# Patient Record
Sex: Female | Born: 1955 | Race: White | Hispanic: No | Smoking: Never smoker
Health system: Southern US, Community
[De-identification: ages and names within clinical notes are randomized; demographics above are authoritative.]

## PROBLEM LIST (undated history)

## (undated) DIAGNOSIS — I10 Essential (primary) hypertension: Secondary | ICD-10-CM

## (undated) DIAGNOSIS — F329 Major depressive disorder, single episode, unspecified: Secondary | ICD-10-CM

## (undated) DIAGNOSIS — I2510 Atherosclerotic heart disease of native coronary artery without angina pectoris: Secondary | ICD-10-CM

## (undated) DIAGNOSIS — E1100 Type 2 diabetes mellitus with hyperosmolarity without nonketotic hyperglycemic-hyperosmolar coma (NKHHC): Secondary | ICD-10-CM

## (undated) DIAGNOSIS — I214 Non-ST elevation (NSTEMI) myocardial infarction: Secondary | ICD-10-CM

## (undated) DIAGNOSIS — F32A Depression, unspecified: Secondary | ICD-10-CM

## (undated) DIAGNOSIS — E785 Hyperlipidemia, unspecified: Secondary | ICD-10-CM

## (undated) DIAGNOSIS — F419 Anxiety disorder, unspecified: Secondary | ICD-10-CM

## (undated) HISTORY — PX: BREAST SURGERY: SHX581

## (undated) HISTORY — PX: OTHER SURGICAL HISTORY: SHX169

## (undated) HISTORY — DX: Major depressive disorder, single episode, unspecified: F32.9

## (undated) HISTORY — DX: Anxiety disorder, unspecified: F41.9

## (undated) HISTORY — DX: Depression, unspecified: F32.A

## (undated) HISTORY — DX: Type 2 diabetes mellitus with hyperosmolarity without nonketotic hyperglycemic-hyperosmolar coma (NKHHC): E11.00

## (undated) HISTORY — DX: Essential (primary) hypertension: I10

---

## 2001-03-29 ENCOUNTER — Other Ambulatory Visit: Admission: RE | Admit: 2001-03-29 | Discharge: 2001-03-29 | Payer: Self-pay | Admitting: *Deleted

## 2003-04-19 ENCOUNTER — Ambulatory Visit (HOSPITAL_COMMUNITY): Admission: RE | Admit: 2003-04-19 | Discharge: 2003-04-19 | Payer: Self-pay | Admitting: Family Medicine

## 2011-06-21 ENCOUNTER — Ambulatory Visit (INDEPENDENT_AMBULATORY_CARE_PROVIDER_SITE_OTHER): Payer: Commercial Managed Care - PPO

## 2011-06-21 DIAGNOSIS — M545 Low back pain, unspecified: Secondary | ICD-10-CM

## 2011-06-21 DIAGNOSIS — M5430 Sciatica, unspecified side: Secondary | ICD-10-CM

## 2011-06-28 ENCOUNTER — Ambulatory Visit (INDEPENDENT_AMBULATORY_CARE_PROVIDER_SITE_OTHER): Payer: Commercial Managed Care - PPO

## 2011-06-28 DIAGNOSIS — M545 Low back pain, unspecified: Secondary | ICD-10-CM

## 2012-01-29 ENCOUNTER — Ambulatory Visit (INDEPENDENT_AMBULATORY_CARE_PROVIDER_SITE_OTHER): Payer: Commercial Managed Care - PPO | Admitting: Emergency Medicine

## 2012-01-29 VITALS — BP 170/83 | HR 70 | Temp 98.4°F | Resp 16 | Ht <= 58 in | Wt 198.0 lb

## 2012-01-29 DIAGNOSIS — M23302 Other meniscus derangements, unspecified lateral meniscus, unspecified knee: Secondary | ICD-10-CM

## 2012-01-29 MED ORDER — HYDROCODONE-ACETAMINOPHEN 5-325 MG PO TABS: 1.0000 | ORAL_TABLET | ORAL | Status: AC | PRN

## 2012-01-29 MED ORDER — NAPROXEN SODIUM 550 MG PO TABS: 550.0000 mg | ORAL_TABLET | Freq: Two times a day (BID) | ORAL | Status: AC

## 2012-01-29 NOTE — Patient Instructions (Addendum)
Knee Sprain  You have a knee sprain. Sprains are painful injuries to the joints. A sprain is a partial or complete tearing of ligaments. Ligaments are tough, fibrous tissues that hold bones together at the joints. A strain (sprain) has occurred when a ligament is stretched or damaged. This injury may take several weeks to heal. This is often the same length of time as a bone fracture (break in bone) takes to heal. Even though a fracture (bone break) may not have occurred, the recovery times may be similar.  HOME CARE INSTRUCTIONS   · Rest the injured area for as long as directed by your caregiver. Then slowly start using the joint as directed by your caregiver and as the pain allows. Use crutches as directed. If the knee was splinted or casted, continue use and care as directed. If an ace bandage has been applied today, it should be removed and reapplied every 3 to 4 hours. It should not be applied tightly, but firmly enough to keep swelling down. Watch toes and feet for swelling, bluish discoloration, coldness, numbness or excessive pain. If any of these symptoms occur, remove the ace bandage and reapply more loosely.If these symptoms persist, seek medical attention.  · For the first 24 hours, lie down. Keep the injured extremity elevated on two pillows.  · Apply ice to the injured area for 15 to 20 minutes every couple hours. Repeat this 3 to 4 times per day for the first 48 hours. Put the ice in a plastic bag and place a towel between the bag of ice and your skin.  · Wear any splinting, casting, or elastic bandage applications as instructed.  · Only take over-the-counter or prescription medicines for pain, discomfort, or fever as directed by your caregiver. Do not use aspirin immediately after the injury unless instructed by your caregiver. Aspirin can cause increased bleeding and bruising of the tissues.  · If you were given crutches, continue to use them as instructed. Do not resume weight bearing on the  affected extremity until instructed.  Persistent pain and inability to use the injured area as directed for more than 2 to 3 days are warning signs. If this happens you should see a caregiver for a follow-up visit as soon as possible. Initially, a hairline fracture (this is the same as a broken bone) may not be evident on x-rays. Persistent pain and swelling indicate that further evaluation, non-weight bearing (use of crutches as instructed), and/or further x-rays are indicated. X-rays may sometimes not show a small fracture until a week or ten days later. Make a follow-up appointment with your own caregiver or one to whom we have referred you. A radiologist (specialist in reading x-rays) may re-read your X-rays. Make sure you know how you are to get your x-ray results. Do not assume everything is normal if you do not hear from us.  SEEK MEDICAL CARE IF:   · Bruising, swelling, or pain increases.  · You have cold or numb toes  · You have continuing difficulty or pain with walking.  SEEK IMMEDIATE MEDICAL CARE IF:   · Your toes are cold, numb or blue.  · The pain is not responding to medications and continues to stay the same or get worse.  MAKE SURE YOU:   · Understand these instructions.  · Will watch your condition.  · Will get help right away if you are not doing well or get worse.  Document Released: 06/01/2005 Document Revised: 05/21/2011 Document Reviewed: 05/16/2007    [employer]® Patient Information ©2012 [employer].

## 2012-01-29 NOTE — Progress Notes (Signed)
   Date:  01/29/2012   Name:  AIJA SCARFO   DOB:  [birth year]   MRN:  [id number] Gender: female  Age: 56 y.o.  PCP:  No primary provider on file.    Chief Complaint: Knee Pain   History of Present Illness:  Nicole Barry is a 56 y.o. pleasant patient who presents with the following:  At beach in May and crossed legs to stand up from low beach chair and felt pain in her left knee that has been intermittent since.  Pain now in lateral knee and has marked effusion and pain with weight bearing and flexion.  No history of recent injury.  No locking or clicking  There is no problem list on file for this patient.   No past medical history on file.  No past surgical history on file.  History  Substance Use Topics  . Smoking status: Never Smoker   . Smokeless tobacco: Not on file  . Alcohol Use: Not on file    No family history on file.  Allergies  Allergen Reactions  . Sulfa Antibiotics Rash    Medication list has been reviewed and updated.  Current Outpatient Prescriptions on File Prior to Visit  Medication Sig Dispense Refill  . escitalopram (LEXAPRO) 20 MG tablet Take 20 mg by mouth daily.      . rosuvastatin (CRESTOR) 10 MG tablet Take 10 mg by mouth daily.        Review of Systems:  As per HPI, otherwise negative.    Physical Examination: Filed Vitals:   01/29/12 1358  BP: 170/83  Pulse: 70  Temp: 98.4 F (36.9 C)  Resp: 16   Filed Vitals:   01/29/12 1358  Height: 4' 9.4" (1.458 m)  Weight: 198 lb (89.812 kg)   Body mass index is 42.25 kg/(m^2). Ideal Body Weight: Weight in (lb) to have BMI = 25: 116.9    GEN: WDWN, NAD, Non-toxic, Alert & Oriented x 3 HEENT: Atraumatic, Normocephalic.  Ears and Nose: No external deformity. EXTR: No clubbing/cyanosis/edema NEURO: Normal gait.  PSYCH: Normally interactive. Conversant. Not depressed or anxious appearing.  Calm demeanor.  Knee:  Left knee joint effusion and warm.  Guards against flexion.  Suggestive of  lateral meniscus tear  Assessment and Plan: Lateral meniscus tear Crutches Immobilizer (unable due size)  Ice Elevation Anaprox vicodin Follow up in one week if not improved Consider MRI   Carmelina Dane, MD

## 2012-07-11 ENCOUNTER — Ambulatory Visit (INDEPENDENT_AMBULATORY_CARE_PROVIDER_SITE_OTHER): Payer: Commercial Managed Care - PPO | Admitting: Family Medicine

## 2012-07-11 VITALS — BP 139/80 | HR 99 | Temp 99.7°F | Resp 18 | Ht <= 58 in | Wt 199.6 lb

## 2012-07-11 DIAGNOSIS — M778 Other enthesopathies, not elsewhere classified: Secondary | ICD-10-CM

## 2012-07-11 DIAGNOSIS — M7500 Adhesive capsulitis of unspecified shoulder: Secondary | ICD-10-CM

## 2012-07-11 MED ORDER — KETOROLAC TROMETHAMINE 60 MG/2ML IM SOLN
60.0000 mg | Freq: Once | INTRAMUSCULAR | Status: AC
  Administered 2012-07-11: 60 mg via INTRAMUSCULAR

## 2012-07-11 MED ORDER — PREDNISONE 20 MG PO TABS: ORAL_TABLET | ORAL | Status: DC

## 2012-07-11 MED ORDER — OXYCODONE-ACETAMINOPHEN 7.5-325 MG PO TABS: 1.0000 | ORAL_TABLET | ORAL | Status: DC | PRN

## 2012-07-11 NOTE — Progress Notes (Signed)
57 yo woman with progressive left shoulder pain x 8 days after doing a great deal of lifting.  She's had rotator cuff surgery in the past on that shoulder 1998-9  Currently, she cannot move the shoulder or touch the anterior aspect of the joint line.  She was vomiting secondary to the pain yesterday.  Tried vicodin yesterday which relieved the pain for only a couple hours.  Currently unemployed  Called earlier and [employer] told her to come in at 5:00 to see me.  Objective:  Crying in pain, unable to move the shoulder Tender anterior  Joint line   Assessment:  Acute capsulitis  Plan;

## 2012-07-11 NOTE — Patient Instructions (Signed)
Rotator Cuff Injury The rotator cuff is the collective set of muscles and tendons that make up the stabilizing unit of your shoulder. This unit holds in the ball of the humerus (upper arm bone) in the socket of the scapula (shoulder blade). Injuries to this stabilizing unit most commonly come from sports or activities that cause the arm to be moved repeatedly over the head. Examples of this include throwing, weight lifting, swimming, racquet sports, or an injury such as falling on your arm. Chronic (longstanding) irritation of this unit can cause inflammation (soreness), bursitis, and eventual damage to the tendons to the point of rupture (tear). An acute (sudden) injury of the rotator cuff can result in a partial or complete tear. You may need surgery with complete tears. Small or partial rotator cuff tears may be treated conservatively with temporary immobilization, exercises and rest. Physical therapy may be needed. HOME CARE INSTRUCTIONS   Apply ice to the injury for 15 to 20 minutes 3 to 4 times per day for the first 2 days. Put the ice in a plastic bag and place a towel between the bag of ice and your skin.  If you have a shoulder immobilizer (sling and straps), do not remove it for as long as directed by your caregiver or until you see a caregiver for a follow-up examination. If you need to remove it, move your arm as little as possible.  You may want to sleep on several pillows or in a recliner at night to lessen swelling and pain.  Only take over-the-counter or prescription medicines for pain, discomfort, or fever as directed by your caregiver.  Do simple hand squeezing exercises with a soft rubber ball to decrease hand swelling. SEEK MEDICAL CARE IF:   Pain in your shoulder increases or new pain or numbness develops in your arm, hand, or fingers.  Your hand or fingers are colder than your other hand. SEEK IMMEDIATE MEDICAL CARE IF:   Your arm, hand, or fingers are numb or  tingling.  Your arm, hand, or fingers are increasingly swollen and painful, or turn white or blue. Document Released: 05/29/2000 Document Revised: 08/24/2011 Document Reviewed: 05/22/2008 [facility] Patient Information 2013 [place], [place].

## 2013-07-08 ENCOUNTER — Emergency Department (HOSPITAL_BASED_OUTPATIENT_CLINIC_OR_DEPARTMENT_OTHER)
Admission: EM | Admit: 2013-07-08 | Discharge: 2013-07-08 | Disposition: A | Discharge status: Home / Self Care | Payer: BC Managed Care – PPO | Attending: Emergency Medicine | Admitting: Emergency Medicine

## 2013-07-08 ENCOUNTER — Emergency Department (HOSPITAL_BASED_OUTPATIENT_CLINIC_OR_DEPARTMENT_OTHER): Payer: BC Managed Care – PPO

## 2013-07-08 ENCOUNTER — Encounter (HOSPITAL_BASED_OUTPATIENT_CLINIC_OR_DEPARTMENT_OTHER): Payer: Self-pay | Admitting: Emergency Medicine

## 2013-07-08 DIAGNOSIS — Z79899 Other long term (current) drug therapy: Secondary | ICD-10-CM | POA: Insufficient documentation

## 2013-07-08 DIAGNOSIS — I10 Essential (primary) hypertension: Secondary | ICD-10-CM | POA: Insufficient documentation

## 2013-07-08 DIAGNOSIS — F3289 Other specified depressive episodes: Secondary | ICD-10-CM | POA: Insufficient documentation

## 2013-07-08 DIAGNOSIS — IMO0002 Reserved for concepts with insufficient information to code with codable children: Secondary | ICD-10-CM | POA: Insufficient documentation

## 2013-07-08 DIAGNOSIS — F411 Generalized anxiety disorder: Secondary | ICD-10-CM | POA: Insufficient documentation

## 2013-07-08 DIAGNOSIS — S5012XA Contusion of left forearm, initial encounter: Secondary | ICD-10-CM

## 2013-07-08 DIAGNOSIS — S20212A Contusion of left front wall of thorax, initial encounter: Secondary | ICD-10-CM

## 2013-07-08 DIAGNOSIS — W1809XA Striking against other object with subsequent fall, initial encounter: Secondary | ICD-10-CM | POA: Insufficient documentation

## 2013-07-08 DIAGNOSIS — Y929 Unspecified place or not applicable: Secondary | ICD-10-CM | POA: Insufficient documentation

## 2013-07-08 DIAGNOSIS — S20219A Contusion of unspecified front wall of thorax, initial encounter: Secondary | ICD-10-CM | POA: Insufficient documentation

## 2013-07-08 DIAGNOSIS — S5010XA Contusion of unspecified forearm, initial encounter: Secondary | ICD-10-CM | POA: Insufficient documentation

## 2013-07-08 DIAGNOSIS — F329 Major depressive disorder, single episode, unspecified: Secondary | ICD-10-CM | POA: Insufficient documentation

## 2013-07-08 DIAGNOSIS — W108XXA Fall (on) (from) other stairs and steps, initial encounter: Secondary | ICD-10-CM | POA: Insufficient documentation

## 2013-07-08 DIAGNOSIS — Y9389 Activity, other specified: Secondary | ICD-10-CM | POA: Insufficient documentation

## 2013-07-08 MED ORDER — OXYCODONE-ACETAMINOPHEN 5-325 MG PO TABS: 1.0000 | ORAL_TABLET | ORAL | Status: DC | PRN

## 2013-07-08 MED ORDER — MELOXICAM 7.5 MG PO TABS: 7.5000 mg | ORAL_TABLET | Freq: Every day | ORAL | Status: DC

## 2013-07-08 NOTE — ED Provider Notes (Signed)
CSN: [id number]     Arrival date & time 07/08/13  2046 History   None    Chief Complaint  Patient presents with  . Fall   (Consider location/radiation/quality/duration/timing/severity/associated sxs/prior Treatment) Patient is a 58 y.o. female presenting with fall. The history is provided by the patient.  Fall This is a new problem. The current episode started today. The problem has been gradually worsening. Pertinent negatives include no abdominal pain, chest pain, chills, fever, headaches, nausea or vomiting.   AILANIE RUTTAN is a 58 y.o. female who presents to the ED after a fall earlier today. She was going down the steps and slipped and fell on her left side. She is having pain in the left rib area and swelling, bruising and pain of the left forearm. She has been using ice, rest and elevation without relief. She denies any other injuries. No nausea or vomiting, no LOC.  Past Medical History  Diagnosis Date  . Depression   . Anxiety   . Hypertension    Past Surgical History  Procedure Laterality Date  . Appendectomy    . Breast surgery    . Cesarean section    . Rotator cull surg      lt shoulder   No family history on file. History  Substance Use Topics  . Smoking status: Never Smoker   . Smokeless tobacco: Never Used  . Alcohol Use: Yes     Comment: occasional   OB History   Grav Para Term Preterm Abortions TAB SAB Ect Mult Living                 Review of Systems  Constitutional: Negative for fever and chills.  HENT: Negative.   Eyes: Negative for visual disturbance.  Respiratory: Negative for shortness of breath.   Cardiovascular: Negative for chest pain.  Gastrointestinal: Negative for nausea, vomiting and abdominal pain.  Musculoskeletal:       Left rib pain  Skin: Wound: abrasion left forearm.  Allergic/Immunologic: Negative for immunocompromised state.  Neurological: Negative for syncope and headaches.  Psychiatric/Behavioral: Negative for confusion. The  patient is not nervous/anxious.     Allergies  Sulfa antibiotics  Home Medications   Current Outpatient Rx  Name  Route  Sig  Dispense  Refill  . escitalopram (LEXAPRO) 20 MG tablet   Oral   Take 20 mg by mouth daily.         Marland Kitchen oxyCODONE-acetaminophen (PERCOCET) 7.5-325 MG per tablet   Oral   Take 1 tablet by mouth every 4 (four) hours as needed for pain.   30 tablet   0   . predniSONE (DELTASONE) 20 MG tablet      Two daily with food   10 tablet   0   . rosuvastatin (CRESTOR) 10 MG tablet   Oral   Take 10 mg by mouth daily.         . traZODone (DESYREL) 50 MG tablet   Oral   Take 50 mg by mouth at bedtime.          BP 144/69  Pulse 85  Temp(Src) 98.4 F (36.9 C) (Oral)  Resp 22  Ht 4\' 10"  (1.473 m)  Wt 190 lb (86.183 kg)  BMI 39.72 kg/m2 Physical Exam  Nursing note and vitals reviewed. Constitutional: She is oriented to person, place, and time. She appears well-developed and well-nourished. No distress.  HENT:  Head: Normocephalic and atraumatic.  Eyes: Conjunctivae and EOM are normal.  Neck: Normal range  of motion. Neck supple.  Cardiovascular: Normal rate and regular rhythm.   Pulmonary/Chest: Effort normal. No respiratory distress. She has no wheezes. She has no rales.    Left anterior rib tenderness on palpation.   Abdominal: Soft. There is no tenderness.  Musculoskeletal: Normal range of motion.       Left forearm: She exhibits tenderness and swelling. She exhibits no deformity. Lacerations: abrasion.  Ecchymosis noted to left forearm. Radial pulse strong, adequate circulation. Good touch sensation.   Neurological: She is alert and oriented to person, place, and time. She has normal strength. No cranial nerve deficit or sensory deficit. Gait normal.  Skin: Skin is warm and dry.  Psychiatric: She has a normal mood and affect. Her behavior is normal.    ED Course  Procedures (including critical care time) Labs Review Labs Reviewed - No data  to display Imaging Review Dg Ribs Unilateral W/chest Left  07/08/2013   CLINICAL DATA:  Fall.  Left-sided rib pain.  EXAM: LEFT RIBS AND CHEST - 3+ VIEW  COMPARISON:  10/21/2007  FINDINGS: No fracture or other bone lesions are seen involving the ribs. There is no evidence of pneumothorax or pleural effusion. Both lungs are clear. Heart size and mediastinal contours are within normal limits. Mild spondylosis of the spine.  IMPRESSION: No acute findings.   Electronically Signed   By: Elberta Fortisaniel  Boyle M.D.   On: 07/08/2013 21:26   Dg Forearm Left  07/08/2013   CLINICAL DATA:  Fall.  EXAM: LEFT FOREARM - 2 VIEW  COMPARISON:  None.  FINDINGS: There is soft tissue swelling over the dorsal aspect of the mid to distal forearm. There is no acute fracture or dislocation.  IMPRESSION: No acute fracture.   Electronically Signed   By: Elberta Fortisaniel  Boyle M.D.   On: 07/08/2013 21:24     MDM  58 y.o. female with left rib contusion, left forearm contusion and hematoma. Ace wrap applied to the wrist and forearm, left, ice and elevation. Will treat for pain and inflammation. Patient stable for discharge, neurovascularly intact. She will return as needed for worsening symptoms.  I have reviewed this patient's vital signs, nurses notes, appropriate labs and imaging.  I have discussed findings and plan of care with the patient and she voices understanding.    Medication List    STOP taking these medications       oxyCODONE-acetaminophen 7.5-325 MG per tablet  Commonly known as:  PERCOCET  Replaced by:  oxyCODONE-acetaminophen 5-325 MG per tablet     predniSONE 20 MG tablet  Commonly known as:  DELTASONE      TAKE these medications       meloxicam 7.5 MG tablet  Commonly known as:  MOBIC  Take 1 tablet (7.5 mg total) by mouth daily.     oxyCODONE-acetaminophen 5-325 MG per tablet  Commonly known as:  ROXICET  Take 1 tablet by mouth every 4 (four) hours as needed for severe pain.      ASK your doctor about  these medications       escitalopram 20 MG tablet  Commonly known as:  LEXAPRO  Take 20 mg by mouth daily.     rosuvastatin 10 MG tablet  Commonly known as:  CRESTOR  Take 10 mg by mouth daily.     traZODone 50 MG tablet  Commonly known as:  DESYREL  Take 50 mg by mouth at bedtime.           Hope Orlene OchM Neese, NP  07/08/13 2216 

## 2013-07-08 NOTE — ED Notes (Signed)
Patient fell down approximately three steps, hitting left chest wall and left arm.  C/o left sided rib pain, left arm pain.  Swelling, bruising noted to left forearm.  Pain to palpation left chest wall.  Denies striking head or LOC.

## 2013-07-09 NOTE — ED Provider Notes (Signed)
Medical screening examination/treatment/procedure(s) were performed by non-physician practitioner and as supervising physician I was immediately available for consultation/collaboration.  EKG Interpretation   None         Elray Dains W. Kyel Purk, MD 07/09/13 1951 

## 2014-03-08 ENCOUNTER — Other Ambulatory Visit (HOSPITAL_COMMUNITY)
Admission: RE | Admit: 2014-03-08 | Discharge: 2014-03-08 | Disposition: A | Discharge status: Home / Self Care | Payer: BC Managed Care – PPO | Source: Ambulatory Visit | Attending: Family Medicine | Admitting: Family Medicine

## 2014-03-08 ENCOUNTER — Other Ambulatory Visit: Payer: Self-pay | Admitting: Family Medicine

## 2014-03-08 DIAGNOSIS — Z124 Encounter for screening for malignant neoplasm of cervix: Secondary | ICD-10-CM | POA: Diagnosis present

## 2014-03-08 DIAGNOSIS — Z1151 Encounter for screening for human papillomavirus (HPV): Secondary | ICD-10-CM | POA: Insufficient documentation

## 2014-03-09 LAB — CYTOLOGY - PAP

## 2014-05-17 ENCOUNTER — Inpatient Hospital Stay (HOSPITAL_COMMUNITY)
Admission: EM | Admit: 2014-05-17 | Discharge: 2014-05-21 | DRG: 872 | Disposition: A | Discharge status: Home / Self Care | Payer: BC Managed Care – PPO | Attending: Internal Medicine | Admitting: Internal Medicine

## 2014-05-17 ENCOUNTER — Encounter (HOSPITAL_COMMUNITY): Payer: Self-pay | Admitting: Emergency Medicine

## 2014-05-17 ENCOUNTER — Emergency Department (HOSPITAL_COMMUNITY): Payer: BC Managed Care – PPO

## 2014-05-17 DIAGNOSIS — Z79899 Other long term (current) drug therapy: Secondary | ICD-10-CM | POA: Diagnosis not present

## 2014-05-17 DIAGNOSIS — D649 Anemia, unspecified: Secondary | ICD-10-CM | POA: Diagnosis not present

## 2014-05-17 DIAGNOSIS — E878 Other disorders of electrolyte and fluid balance, not elsewhere classified: Secondary | ICD-10-CM | POA: Diagnosis present

## 2014-05-17 DIAGNOSIS — F418 Other specified anxiety disorders: Secondary | ICD-10-CM | POA: Diagnosis present

## 2014-05-17 DIAGNOSIS — F329 Major depressive disorder, single episode, unspecified: Secondary | ICD-10-CM | POA: Diagnosis present

## 2014-05-17 DIAGNOSIS — I10 Essential (primary) hypertension: Secondary | ICD-10-CM | POA: Diagnosis present

## 2014-05-17 DIAGNOSIS — Z6841 Body Mass Index (BMI) 40.0 and over, adult: Secondary | ICD-10-CM

## 2014-05-17 DIAGNOSIS — K5732 Diverticulitis of large intestine without perforation or abscess without bleeding: Secondary | ICD-10-CM | POA: Diagnosis present

## 2014-05-17 DIAGNOSIS — K5792 Diverticulitis of intestine, part unspecified, without perforation or abscess without bleeding: Secondary | ICD-10-CM | POA: Diagnosis present

## 2014-05-17 DIAGNOSIS — E860 Dehydration: Secondary | ICD-10-CM | POA: Diagnosis present

## 2014-05-17 DIAGNOSIS — E871 Hypo-osmolality and hyponatremia: Secondary | ICD-10-CM | POA: Diagnosis present

## 2014-05-17 DIAGNOSIS — F419 Anxiety disorder, unspecified: Secondary | ICD-10-CM | POA: Diagnosis present

## 2014-05-17 DIAGNOSIS — F32A Depression, unspecified: Secondary | ICD-10-CM | POA: Diagnosis present

## 2014-05-17 DIAGNOSIS — R109 Unspecified abdominal pain: Secondary | ICD-10-CM | POA: Diagnosis present

## 2014-05-17 DIAGNOSIS — A419 Sepsis, unspecified organism: Principal | ICD-10-CM | POA: Diagnosis present

## 2014-05-17 DIAGNOSIS — E669 Obesity, unspecified: Secondary | ICD-10-CM | POA: Diagnosis present

## 2014-05-17 HISTORY — DX: Diverticulitis of intestine, part unspecified, without perforation or abscess without bleeding: K57.92

## 2014-05-17 LAB — HEPATIC FUNCTION PANEL
ALT: 18 U/L (ref 0–35)
AST: 14 U/L (ref 0–37)
Albumin: 3.9 g/dL (ref 3.5–5.2)
Alkaline Phosphatase: 70 U/L (ref 39–117)
Bilirubin, Direct: <0.2 mg/dL (ref 0.0–0.3)
Total Bilirubin: 0.7 mg/dL (ref 0.3–1.2)
Total Protein: 7.8 g/dL (ref 6.0–8.3)

## 2014-05-17 LAB — CBC WITH DIFFERENTIAL/PLATELET
Basophils Absolute: 0 10*3/uL (ref 0.0–0.1)
Basophils Relative: 0 % (ref 0–1)
Eosinophils Absolute: 0 10*3/uL (ref 0.0–0.7)
Eosinophils Relative: 0 % (ref 0–5)
HCT: 41.8 % (ref 36.0–46.0)
Hemoglobin: 14.1 g/dL (ref 12.0–15.0)
Lymphocytes Relative: 7 % — ABNORMAL LOW (ref 12–46)
Lymphs Abs: 1.2 10*3/uL (ref 0.7–4.0)
MCH: 30.3 pg (ref 26.0–34.0)
MCHC: 33.7 g/dL (ref 30.0–36.0)
MCV: 89.9 fL (ref 78.0–100.0)
Monocytes Absolute: 1.4 10*3/uL — ABNORMAL HIGH (ref 0.1–1.0)
Monocytes Relative: 9 % (ref 3–12)
Neutro Abs: 13.3 10*3/uL — ABNORMAL HIGH (ref 1.7–7.7)
Neutrophils Relative %: 84 % — ABNORMAL HIGH (ref 43–77)
Platelets: 301 10*3/uL (ref 150–400)
RBC: 4.65 MIL/uL (ref 3.87–5.11)
RDW: 12.2 % (ref 11.5–15.5)
WBC: 16 10*3/uL — ABNORMAL HIGH (ref 4.0–10.5)

## 2014-05-17 LAB — BASIC METABOLIC PANEL
Anion gap: 15 (ref 5–15)
BUN: 12 mg/dL (ref 6–23)
CO2: 27 mEq/L (ref 19–32)
Calcium: 9.7 mg/dL (ref 8.4–10.5)
Chloride: 93 mEq/L — ABNORMAL LOW (ref 96–112)
Creatinine, Ser: 0.75 mg/dL (ref 0.50–1.10)
GFR calc Af Amer: >90 mL/min (ref >90)
GFR calc non Af Amer: >90 mL/min (ref >90)
Glucose, Bld: 147 mg/dL — ABNORMAL HIGH (ref 70–99)
Potassium: 3.9 mEq/L (ref 3.7–5.3)
Sodium: 135 mEq/L — ABNORMAL LOW (ref 137–147)

## 2014-05-17 LAB — LIPASE, BLOOD: Lipase: 21 U/L (ref 11–59)

## 2014-05-17 LAB — I-STAT CG4 LACTIC ACID, ED: Lactic Acid, Venous: 1.08 mmol/L (ref 0.5–2.2)

## 2014-05-17 MED ORDER — CIPROFLOXACIN IN D5W 400 MG/200ML IV SOLN
400.0000 mg | Freq: Once | INTRAVENOUS | Status: AC
  Administered 2014-05-17: 400 mg via INTRAVENOUS

## 2014-05-17 MED ORDER — ONDANSETRON HCL 4 MG/2ML IJ SOLN
4.0000 mg | Freq: Once | INTRAMUSCULAR | Status: AC
  Administered 2014-05-17: 4 mg via INTRAVENOUS
  Filled 2014-05-17: qty 2

## 2014-05-17 MED ORDER — ACETAMINOPHEN 325 MG PO TABS
650.0000 mg | ORAL_TABLET | Freq: Once | ORAL | Status: AC
  Administered 2014-05-17: 650 mg via ORAL
  Filled 2014-05-17: qty 2

## 2014-05-17 MED ORDER — HYDROMORPHONE HCL 1 MG/ML IJ SOLN
1.0000 mg | Freq: Once | INTRAMUSCULAR | Status: AC
  Administered 2014-05-17: 1 mg via INTRAVENOUS

## 2014-05-17 MED ORDER — METRONIDAZOLE IN NACL 5-0.79 MG/ML-% IV SOLN
500.0000 mg | Freq: Once | INTRAVENOUS | Status: AC
  Administered 2014-05-17: 500 mg via INTRAVENOUS

## 2014-05-17 MED ORDER — CIPROFLOXACIN IN D5W 400 MG/200ML IV SOLN
INTRAVENOUS | Status: AC
Start: 2014-05-17 — End: 2014-05-19
  Administered 2014-05-19: 400 mg via INTRAVENOUS
  Filled 2014-05-17: qty 200

## 2014-05-17 MED ORDER — METRONIDAZOLE IN NACL 5-0.79 MG/ML-% IV SOLN
INTRAVENOUS | Status: AC
  Filled 2014-05-17: qty 100

## 2014-05-17 MED ORDER — SODIUM CHLORIDE 0.9 % IV BOLUS (SEPSIS)
1000.0000 mL | Freq: Once | INTRAVENOUS | Status: AC
  Administered 2014-05-17: 1000 mL via INTRAVENOUS

## 2014-05-17 MED ORDER — IOHEXOL 300 MG/ML  SOLN
100.0000 mL | Freq: Once | INTRAMUSCULAR | Status: AC | PRN
  Administered 2014-05-17: 100 mL via INTRAVENOUS

## 2014-05-17 MED ORDER — IOHEXOL 300 MG/ML  SOLN: 25.0000 mL | Freq: Once | INTRAMUSCULAR | Status: AC | PRN

## 2014-05-17 MED ORDER — HYDROMORPHONE HCL 1 MG/ML IJ SOLN
INTRAMUSCULAR | Status: AC
  Filled 2014-05-17: qty 1

## 2014-05-17 NOTE — ED Notes (Signed)
Admitting MD at BS.  

## 2014-05-17 NOTE — ED Notes (Signed)
Pt reports her abdominal pain started last night and progressed throughout the day today, [facility] to [facility]/ruled out for appendicitis. EMS reports rebound tenderness and RLQ and mid abdominal pain. Pt reports nausea without vomiting, and no diarrhea. Pt also reports the need to urinate more frequently. EMS adm 100 mcg of fentanyl en route to department.

## 2014-05-17 NOTE — ED Notes (Signed)
CT called and informed pt has finished her contrast.  

## 2014-05-17 NOTE — ED Notes (Signed)
MD informed of pt's pain and request for pain medication.

## 2014-05-17 NOTE — ED Notes (Signed)
Pt placed into gown and on monitor upon arrival to room. Pt monitored by blood pressure, pulse ox, and 5 lead.  

## 2014-05-17 NOTE — ED Provider Notes (Signed)
CSN: [id number]     Arrival date & time 05/17/14  2023 History   First MD Initiated Contact with Patient 05/17/14 2042     Chief Complaint  Patient presents with  . Abdominal Pain  . Nausea     (Consider location/radiation/quality/duration/timing/severity/associated sxs/prior Treatment) HPI Patient is an otherwise healthy 58 year old female who presents complaining of abdominal pain. She says that yesterday she began feeling mid abdominal, or he umbilical abdominal discomfort. She says that it is worsened over the course of the day today, and now seems to be more well localized to the right lower quadrant. She denies any similar symptoms in the past. She denies any pain in her right upper quadrant, specifically after eating. She has had no chest pain or shortness of breath. She has had no cough or dysuria.   Past Medical History  Diagnosis Date  . Depression   . Anxiety   . Hypertension    Past Surgical History  Procedure Laterality Date  . Breast surgery    . Cesarean section    . Rotator cull surg      lt shoulder   No family history on file. History  Substance Use Topics  . Smoking status: Never Smoker   . Smokeless tobacco: Never Used  . Alcohol Use: Yes     Comment: occasional   OB History    No data available     Review of Systems  Constitutional: Positive for fever.  Gastrointestinal: Positive for abdominal pain. Negative for abdominal distention and anal bleeding.  Genitourinary: Positive for vaginal bleeding. Negative for dysuria, vaginal discharge and pelvic pain.  All other systems reviewed and are negative.     Allergies  Sulfa antibiotics  Home Medications   Prior to Admission medications   Medication Sig Start Date End Date Taking? Authorizing Provider  ALPRAZolam Prudy Feeler(XANAX) 0.5 MG tablet Take 0.5 mg by mouth 2 (two) times daily as needed. 05/13/14  Yes Historical Provider, MD  escitalopram (LEXAPRO) 20 MG tablet Take 20 mg by mouth daily.   Yes  Historical Provider, MD  lisinopril-hydrochlorothiazide (PRINZIDE,ZESTORETIC) 10-12.5 MG per tablet Take 1 tablet by mouth daily. 05/13/14  Yes Historical Provider, MD  PROAIR HFA 108 (90 BASE) MCG/ACT inhaler Inhale 2 puffs into the lungs daily as needed. 05/13/14  Yes Historical Provider, MD  meloxicam (MOBIC) 7.5 MG tablet Take 1 tablet (7.5 mg total) by mouth daily. 07/08/13   Hope Orlene OchM Neese, NP  oxyCODONE-acetaminophen (ROXICET) 5-325 MG per tablet Take 1 tablet by mouth every 4 (four) hours as needed for severe pain. 07/08/13   Hope Orlene OchM Neese, NP   BP 139/64 mmHg  Pulse 87  Temp(Src) 98.6 F (37 C) (Oral)  Resp 14  Ht 4\' 10"  (1.473 m)  Wt 180 lb (81.647 kg)  BMI 37.63 kg/m2  SpO2 95% Physical Exam  Constitutional: She is oriented to person, place, and time. She appears well-developed and well-nourished.  Ill appearing, diaphoretic  HENT:  Head: Normocephalic and atraumatic.  Eyes: Pupils are equal, round, and reactive to light.  Neck: Normal range of motion. Neck supple.  Cardiovascular: Normal rate and regular rhythm.   Abdominal: Soft. Bowel sounds are normal. There is tenderness in the right lower quadrant, periumbilical area and suprapubic area. There is guarding and tenderness at McBurney's point. There is no rebound, no CVA tenderness and negative Murphy's sign.  Musculoskeletal: Normal range of motion. She exhibits no edema.  Neurological: She is alert and oriented to person, place, and time.  Skin: Skin is warm. She is diaphoretic.  Psychiatric: She has a normal mood and affect.  Nursing note and vitals reviewed.   ED Course  Procedures (including critical care time) Labs Review Labs Reviewed  BASIC METABOLIC PANEL - Abnormal; Notable for the following:    Sodium 135 (*)    Chloride 93 (*)    Glucose, Bld 147 (*)    All other components within normal limits  CBC WITH DIFFERENTIAL - Abnormal; Notable for the following:    WBC 16.0 (*)    Neutrophils Relative % 84 (*)     Neutro Abs 13.3 (*)    Lymphocytes Relative 7 (*)    Monocytes Absolute 1.4 (*)    All other components within normal limits  LIPASE, BLOOD  HEPATIC FUNCTION PANEL  I-STAT CG4 LACTIC ACID, ED    Imaging Review Ct Abdomen Pelvis W Contrast  05/17/2014   CLINICAL DATA:  Acute onset of periumbilical and right lower quadrant abdominal pain and tenderness, with nausea. Initial encounter.  EXAM: CT ABDOMEN AND PELVIS WITH CONTRAST  TECHNIQUE: Multidetector CT imaging of the abdomen and pelvis was performed using the standard protocol following bolus administration of intravenous contrast.  CONTRAST:  100mL OMNIPAQUE IOHEXOL 300 MG/ML  SOLN  COMPARISON:  CT of the abdomen performed 04/17/2005; the pelvic CT is not available for comparison. Pelvic ultrasound from 04/19/2003  FINDINGS: The visualized lung bases are clear. Mild coronary artery calcification is noted.  The liver and spleen are unremarkable in appearance. The gallbladder is within normal limits. The pancreas and adrenal glands are unremarkable.  The kidneys are unremarkable in appearance. There is no evidence of hydronephrosis. No renal or ureteral stones are seen. No perinephric stranding is appreciated.  The small bowel is unremarkable in appearance. The stomach is within normal limits. No acute vascular abnormalities are seen.  The appendix is normal in caliber, without evidence for appendicitis. Contrast progresses to the level of the proximal transverse colon. A few diverticula are noted along the mid sigmoid colon.  Note is made of focal soft tissue inflammation at the mid sigmoid colon, with mild associated wall thickening, and question of an inflamed diverticulum. This is suspicious for mild acute diverticulitis. Trace associated free fluid is seen, with soft tissue inflammation extending about the adjacent right ovary and uterus. There is no evidence of perforation or abscess formation at this time.  The bladder is mildly distended and  grossly unremarkable. The uterus is otherwise unremarkable in appearance. The ovaries are relatively symmetric, aside from mild soft tissue inflammation and fluid about the right ovary. No inguinal lymphadenopathy is seen.  No acute osseous abnormalities are identified. 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  IMPRESSION: 1. Mild acute diverticulitis at the mid sigmoid colon, with focal soft tissue inflammation and mild colonic wall thickening. Trace associated free fluid. Soft tissue inflammation extends about the adjacent right ovary and uterus. No evidence of perforation or abscess formation at this time. 2. Few diverticula noted along the mid sigmoid colon. 3. Mild coronary artery calcification noted. 4. Chronic bilateral pars defects at L5, with associated grade 1 anterolisthesis of L5 on S1, and underlying mild degenerative change.   Electronically Signed   By: Roanna RaiderJeffery  Chang M.D.   On: 05/17/2014 22:32     EKG Interpretation None      MDM   Final diagnoses:  Diverticulitis large intestine w/o perforation or  abscess w/o bleeding   58 year old otherwise healthy female who presents complaining of abdominal pain, found to have sepsis due to acute diverticulitis. On exam patient has abdominal tenderness, primarily in the right lower quadrant with guarding, but no rebound. No Murphy sign, no right upper quadrant tenderness to suggest cholecystitis. She is febrile to 102, has a leukocytosis to 16,000, and suspected source of infection, needs criteria for sepsis. She has significant pain, controlled now with IV narcotics.  CT scan was obtained to rule out intra-abdominal infection.   CT scan shows acute diverticulitis without abscess formation or perforation.  Considering the patient's fever, significant pain, leukocytosis, and acute and progressive course of this illness, I believe she would be best  served to be admitted for treatment as an inpatient. Will start Cipro and Flagyl and consult hospitalist for admission.    Erskine Emery, MD 05/18/14 1610  Hilario Quarry, MD 05/19/14 [phone]

## 2014-05-17 NOTE — ED Notes (Signed)
Attempted to give report 

## 2014-05-17 NOTE — H&P (Signed)
[facility] History and Physical  [name] FAO:[id number] DOB: [birth year] DOA: 05/17/2014  Referring physician: ER physician. PCP: PROVIDER NOT IN SYSTEM   Chief Complaint: Abdominal pain.  HPI: Nicole Barry is a 58 y.o. female with history of hypertension and depression presents to the ER because of 2 days of abdominal pain. Patient abdominal is mostly periumbilical. Patient has had some nausea but denies any vomiting or diarrhea. Patient's pain is colicky in nature. On exam patient has mild tenderness mostly in the infraumbilical area. CT abdomen and pelvis done shows sigmoid diverticulitis with no abscess or microperforation. Patient has been admitted for further management. Patient states she has had colonoscopy done 5 years ago and is due for one next year. The previous one had shown polyps. It was done at [facility]. Patient otherwise denies any fever chills chest pain or shortness of breath. Denies any recent use of antibiotics.  Review of Systems: As presented in the history of presenting illness, rest negative.  Past Medical History  Diagnosis Date  . Depression   . Anxiety   . Hypertension    Past Surgical History  Procedure Laterality Date  . Breast surgery    . Cesarean section    . Rotator cull surg      lt shoulder   Social History:  reports that she has never smoked. She has never used smokeless tobacco. She reports that she drinks alcohol. She reports that she does not use illicit drugs. Where does patient live home. Can patient participate in ADLs? Yes.  Allergies  Allergen Reactions  . Sulfa Antibiotics Rash    Family History:  Family History  Problem Relation Age of Onset  . Colon cancer Neg Hx       Prior to Admission medications   Medication Sig Start Date End Date Taking? Authorizing Provider  ALPRAZolam Prudy Feeler) 0.5 MG tablet Take 0.5 mg by mouth 2 (two) times daily as needed. 05/13/14  Yes Historical Provider, MD  escitalopram (LEXAPRO) 20 MG  tablet Take 20 mg by mouth daily.   Yes Historical Provider, MD  lisinopril-hydrochlorothiazide (PRINZIDE,ZESTORETIC) 10-12.5 MG per tablet Take 1 tablet by mouth daily. 05/13/14  Yes Historical Provider, MD  PROAIR HFA 108 (90 BASE) MCG/ACT inhaler Inhale 2 puffs into the lungs daily as needed. 05/13/14  Yes Historical Provider, MD  meloxicam (MOBIC) 7.5 MG tablet Take 1 tablet (7.5 mg total) by mouth daily. 07/08/13   Hope Orlene Och, NP  oxyCODONE-acetaminophen (ROXICET) 5-325 MG per tablet Take 1 tablet by mouth every 4 (four) hours as needed for severe pain. 07/08/13   Hope Orlene Och, NP    Physical Exam: Filed Vitals:   05/17/14 2315 05/17/14 2315 05/17/14 2330 05/17/14 2345  BP:   107/60 109/64  Pulse:   80 80  Temp:      TempSrc:      Resp:   18 18  Height:      Weight:      SpO2: 89% 96% 98% 97%     General:  Well-developed and nourished.  Eyes: Anicteric no pallor.  ENT: No discharge from the ears eyes nose mouth.  Neck: No mass felt.  Cardiovascular: S1-S2 heard.  Respiratory: No rhonchi or crepitations.  Abdomen: Mild tenderness in the infraumbilical area bowel sounds present no guarding or rigidity.  Skin: No rash.  Musculoskeletal: No edema.  Psychiatric: Appears normal.  Neurologic: Alert awake oriented to time place and person. Moves all extremities.  Labs on Admission:  Basic Metabolic Panel:  Recent Labs Lab 05/17/14 2028  NA 135*  K 3.9  CL 93*  CO2 27  GLUCOSE 147*  BUN 12  CREATININE 0.75  CALCIUM 9.7   Liver Function Tests:  Recent Labs Lab 05/17/14 2028  AST 14  ALT 18  ALKPHOS 70  BILITOT 0.7  PROT 7.8  ALBUMIN 3.9    Recent Labs Lab 05/17/14 2028  LIPASE 21   No results for input(s): AMMONIA in the last 168 hours. CBC:  Recent Labs Lab 05/17/14 2028  WBC 16.0*  NEUTROABS 13.3*  HGB 14.1  HCT 41.8  MCV 89.9  PLT 301   Cardiac Enzymes: No results for input(s): CKTOTAL, CKMB, CKMBINDEX, TROPONINI in the last 168  hours.  BNP (last 3 results) No results for input(s): PROBNP in the last 8760 hours. CBG: No results for input(s): GLUCAP in the last 168 hours.  Radiological Exams on Admission: Ct Abdomen Pelvis W Contrast  05/17/2014   CLINICAL DATA:  Acute onset of periumbilical and right lower quadrant abdominal pain and tenderness, with nausea. Initial encounter.  EXAM: CT ABDOMEN AND PELVIS WITH CONTRAST  TECHNIQUE: Multidetector CT imaging of the abdomen and pelvis was performed using the standard protocol following bolus administration of intravenous contrast.  CONTRAST:  100mL OMNIPAQUE IOHEXOL 300 MG/ML  SOLN  COMPARISON:  CT of the abdomen performed 04/17/2005; the pelvic CT is not available for comparison. Pelvic ultrasound from 04/19/2003  FINDINGS: The visualized lung bases are clear. Mild coronary artery calcification is noted.  The liver and spleen are unremarkable in appearance. The gallbladder is within normal limits. The pancreas and adrenal glands are unremarkable.  The kidneys are unremarkable in appearance. There is no evidence of hydronephrosis. No renal or ureteral stones are seen. No perinephric stranding is appreciated.  The small bowel is unremarkable in appearance. The stomach is within normal limits. No acute vascular abnormalities are seen.  The appendix is normal in caliber, without evidence for appendicitis. Contrast progresses to the level of the proximal transverse colon. A few diverticula are noted along the mid sigmoid colon.  Note is made of focal soft tissue inflammation at the mid sigmoid colon, with mild associated wall thickening, and question of an inflamed diverticulum. This is suspicious for mild acute diverticulitis. Trace associated free fluid is seen, with soft tissue inflammation extending about the adjacent right ovary and uterus. There is no evidence of perforation or abscess formation at this time.  The bladder is mildly distended and grossly unremarkable. The uterus is  otherwise unremarkable in appearance. The ovaries are relatively symmetric, aside from mild soft tissue inflammation and fluid about the right ovary. No inguinal lymphadenopathy is seen.  No acute osseous abnormalities are identified. 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  IMPRESSION: 1. Mild acute diverticulitis at the mid sigmoid colon, with focal soft tissue inflammation and mild colonic wall thickening. Trace associated free fluid. Soft tissue inflammation extends about the adjacent right ovary and uterus. No evidence of perforation or abscess formation at this time. 2. Few diverticula noted along the mid sigmoid colon. 3. Mild coronary artery calcification noted. 4. Chronic bilateral pars defects at L5, with associated grade 1 anterolisthesis of L5 on S1, and underlying mild degenerative change.   Electronically Signed   By: Roanna RaiderJeffery  Chang M.D.   On: 05/17/2014 22:32     Assessment/Plan Active Problems:   Acute diverticulitis  Hypertension   Diverticulitis   1. Acute sigmoid diverticulitis with no evidence of abscess or microperforation - patient still has significant pain for which patient has been continued on pain relief medications. We will keep patient nothing by mouth and continue with gentle hydration. Patient has been placed on Cipro and Flagyl. If patient's pain continues and may have to do any imaging including CAT scan of the abdomen and pelvis to rule out abscess formation/microperforation and at that point may need surgical evaluation. 2. Hypertension - since patient is nothing by mouth and place patient on when necessary IV hydralazine for systolic blood pressure more than 160. 3. History of depression presently nothing by mouth.    Code Status: Full code.  Family Communication: Patient's family at the bedside.  Disposition Plan: Admit to inpatient.    Saylah Ketner  N. [facility] Pager [phone].  If 7PM-7AM, please contact night-coverage [email or web address] Password [email or web address] 05/17/2014, 11:53 PM

## 2014-05-18 DIAGNOSIS — F329 Major depressive disorder, single episode, unspecified: Secondary | ICD-10-CM

## 2014-05-18 DIAGNOSIS — E871 Hypo-osmolality and hyponatremia: Secondary | ICD-10-CM

## 2014-05-18 DIAGNOSIS — E878 Other disorders of electrolyte and fluid balance, not elsewhere classified: Secondary | ICD-10-CM

## 2014-05-18 LAB — GLUCOSE, CAPILLARY
GLUCOSE-CAPILLARY: 97 mg/dL (ref 70–99)
Glucose-Capillary: 104 mg/dL — ABNORMAL HIGH (ref 70–99)
Glucose-Capillary: 114 mg/dL — ABNORMAL HIGH (ref 70–99)
Glucose-Capillary: 152 mg/dL — ABNORMAL HIGH (ref 70–99)

## 2014-05-18 LAB — CBC WITH DIFFERENTIAL/PLATELET
BASOS PCT: 0 % (ref 0–1)
Basophils Absolute: 0 10*3/uL (ref 0.0–0.1)
Eosinophils Absolute: 0.1 10*3/uL (ref 0.0–0.7)
Eosinophils Relative: 1 % (ref 0–5)
HCT: 38.3 % (ref 36.0–46.0)
Hemoglobin: 12.9 g/dL (ref 12.0–15.0)
Lymphocytes Relative: 11 % — ABNORMAL LOW (ref 12–46)
Lymphs Abs: 1.5 10*3/uL (ref 0.7–4.0)
MCH: 30.9 pg (ref 26.0–34.0)
MCHC: 33.7 g/dL (ref 30.0–36.0)
MCV: 91.8 fL (ref 78.0–100.0)
MONOS PCT: 10 % (ref 3–12)
Monocytes Absolute: 1.4 10*3/uL — ABNORMAL HIGH (ref 0.1–1.0)
NEUTROS ABS: 10.6 10*3/uL — AB (ref 1.7–7.7)
NEUTROS PCT: 78 % — AB (ref 43–77)
Platelets: 229 10*3/uL (ref 150–400)
RBC: 4.17 MIL/uL (ref 3.87–5.11)
RDW: 12.3 % (ref 11.5–15.5)
WBC: 13.5 10*3/uL — ABNORMAL HIGH (ref 4.0–10.5)

## 2014-05-18 LAB — COMPREHENSIVE METABOLIC PANEL
ALK PHOS: 65 U/L (ref 39–117)
ALT: 15 U/L (ref 0–35)
ANION GAP: 12 (ref 5–15)
AST: 13 U/L (ref 0–37)
Albumin: 3.3 g/dL — ABNORMAL LOW (ref 3.5–5.2)
BILIRUBIN TOTAL: 0.7 mg/dL (ref 0.3–1.2)
BUN: 11 mg/dL (ref 6–23)
CHLORIDE: 99 meq/L (ref 96–112)
CO2: 28 meq/L (ref 19–32)
Calcium: 8.7 mg/dL (ref 8.4–10.5)
Creatinine, Ser: 0.75 mg/dL (ref 0.50–1.10)
GLUCOSE: 154 mg/dL — AB (ref 70–99)
POTASSIUM: 4.3 meq/L (ref 3.7–5.3)
SODIUM: 139 meq/L (ref 137–147)
Total Protein: 6.8 g/dL (ref 6.0–8.3)

## 2014-05-18 LAB — LACTIC ACID, PLASMA: Lactic Acid, Venous: 1.2 mmol/L (ref 0.5–2.2)

## 2014-05-18 MED ORDER — HYDRALAZINE HCL 20 MG/ML IJ SOLN: 10.0000 mg | INTRAMUSCULAR | Status: DC | PRN

## 2014-05-18 MED ORDER — ACETAMINOPHEN 325 MG PO TABS
650.0000 mg | ORAL_TABLET | Freq: Four times a day (QID) | ORAL | Status: DC | PRN
  Administered 2014-05-18: 500 mg via ORAL
  Administered 2014-05-19 – 2014-05-21 (×4): 650 mg via ORAL
  Filled 2014-05-18 (×3): qty 2

## 2014-05-18 MED ORDER — CIPROFLOXACIN IN D5W 400 MG/200ML IV SOLN
400.0000 mg | Freq: Two times a day (BID) | INTRAVENOUS | Status: DC
  Administered 2014-05-18 – 2014-05-21 (×7): 400 mg via INTRAVENOUS
  Filled 2014-05-18 (×8): qty 200

## 2014-05-18 MED ORDER — ALBUTEROL SULFATE (2.5 MG/3ML) 0.083% IN NEBU: 3.0000 mL | INHALATION_SOLUTION | Freq: Every day | RESPIRATORY_TRACT | Status: DC | PRN

## 2014-05-18 MED ORDER — ENOXAPARIN SODIUM 40 MG/0.4ML ~~LOC~~ SOLN
40.0000 mg | SUBCUTANEOUS | Status: DC
  Administered 2014-05-18 – 2014-05-21 (×4): 40 mg via SUBCUTANEOUS
  Filled 2014-05-18 (×4): qty 0.4

## 2014-05-18 MED ORDER — ONDANSETRON HCL 4 MG PO TABS: 4.0000 mg | ORAL_TABLET | Freq: Four times a day (QID) | ORAL | Status: DC | PRN

## 2014-05-18 MED ORDER — HYDROMORPHONE HCL 1 MG/ML IJ SOLN
INTRAMUSCULAR | Status: AC
Start: 2014-05-18 — End: 2014-05-18
  Filled 2014-05-18: qty 1

## 2014-05-18 MED ORDER — ONDANSETRON HCL 4 MG/2ML IJ SOLN
4.0000 mg | Freq: Four times a day (QID) | INTRAMUSCULAR | Status: DC | PRN
  Administered 2014-05-19 – 2014-05-20 (×2): 4 mg via INTRAVENOUS
  Filled 2014-05-18 (×2): qty 2

## 2014-05-18 MED ORDER — DEXTROSE-NACL 5-0.9 % IV SOLN
INTRAVENOUS | Status: AC
  Administered 2014-05-18 (×2): via INTRAVENOUS

## 2014-05-18 MED ORDER — METRONIDAZOLE IN NACL 5-0.79 MG/ML-% IV SOLN
500.0000 mg | Freq: Three times a day (TID) | INTRAVENOUS | Status: DC
  Administered 2014-05-18 – 2014-05-21 (×9): 500 mg via INTRAVENOUS
  Filled 2014-05-18 (×15): qty 100

## 2014-05-18 MED ORDER — ACETAMINOPHEN 650 MG RE SUPP
650.0000 mg | Freq: Four times a day (QID) | RECTAL | Status: DC | PRN
  Administered 2014-05-18: 650 mg via RECTAL

## 2014-05-18 MED ORDER — HYDROMORPHONE HCL 1 MG/ML IJ SOLN
1.0000 mg | INTRAMUSCULAR | Status: DC | PRN
  Administered 2014-05-18 – 2014-05-21 (×16): 1 mg via INTRAVENOUS
  Filled 2014-05-18 (×8): qty 1

## 2014-05-18 NOTE — Progress Notes (Signed)
Md made aware of the elevated temperature, no new orders , patient on IV antibiotics.

## 2014-05-18 NOTE — Plan of Care (Signed)
Problem: Consults Goal: General Medical Patient Education See Patient Education Module for specific education. Outcome: Completed/Met Date Met:  05/18/14  Problem: Phase I Progression Outcomes Goal: Pain controlled with appropriate interventions Outcome: Completed/Met Date Met:  05/18/14 Goal: OOB as tolerated unless otherwise ordered Outcome: Completed/Met Date Met:  05/18/14 Goal: Voiding-avoid urinary catheter unless indicated Outcome: Completed/Met Date Met:  05/18/14 Goal: Hemodynamically stable Outcome: Completed/Met Date Met:  05/18/14

## 2014-05-18 NOTE — Progress Notes (Signed)
[facility]                                                                              Patient Demographics  Nicole Barry, is a 58 y.o. female, DOB - [birth year], ZOX:[id number]  Admit date - 05/17/2014   Admitting Physician Eduard Clos, MD  Outpatient Primary MD for the patient is PROVIDER NOT IN SYSTEM  LOS - 1   Chief Complaint  Patient presents with  . Abdominal Pain  . Nausea      HPI on 05/17/2014 by Dr. Midge Minium Nicole Barry is a 58 y.o. female with history of hypertension and depression presents to the ER because of 2 days of abdominal pain. Patient abdominal is mostly periumbilical. Patient has had some nausea but denies any vomiting or diarrhea. Patient's pain is colicky in nature. On exam patient has mild tenderness mostly in the infraumbilical area. CT abdomen and pelvis done shows sigmoid diverticulitis with no abscess or microperforation. Patient has been admitted for further management. Patient states she has had colonoscopy done 5 years ago and is due for one next year. The previous one had shown polyps. It was done at [facility]. Patient otherwise denies any fever chills chest pain or shortness of breath. Denies any recent use of antibiotics.  Assessment & Plan   Sepsis secondary to acute sigmoid diverticulitis -Patient currently febrile with leukocytosis (leukocytosis improving) -CT of the abdomen/pelvis: -Continue ciprofloxacin and Flagyl -Will place patient on clear liquid diet, continue gentle hydration, pain relief as needed  Essential hypertension -Continue hydralazine IV as needed for systolic pressures above 160 -Holding lisinopril and HCTZ  Depression -Lexapro and Xanax currently held  Dehydration with hyponatremia and hypochloremia -Resolved with IVF, will continue to monitor BMP  Code Status: Full  Family Communication: None at bedside  Disposition Plan: Admitted  Time Spent in minutes   30 minutes  Procedures   None  Consults   None  DVT Prophylaxis  Lovenox  Lab Results  Component Value Date   PLT 229 05/18/2014    Medications  Scheduled Meds: . ciprofloxacin  400 mg Intravenous Q12H  . enoxaparin (LOVENOX) injection  40 mg Subcutaneous Q24H  . metronidazole  500 mg Intravenous Q8H   Continuous Infusions: . dextrose 5 % and 0.9% NaCl 125 mL/hr at 05/18/14 0920   PRN Meds:.acetaminophen **OR** acetaminophen, albuterol, hydrALAZINE, HYDROmorphone (DILAUDID) injection, ondansetron **OR** ondansetron (ZOFRAN) IV  Antibiotics    Anti-infectives    Start     Dose/Rate Route Frequency Ordered Stop   05/18/14 1000  ciprofloxacin (CIPRO) IVPB 400 mg     400 mg200 mL/hr over 60 Minutes Intravenous Every 12 hours 05/18/14 0029     05/18/14 0600  metroNIDAZOLE (FLAGYL) IVPB 500 mg     500 mg100 mL/hr over 60 Minutes Intravenous Every 8 hours 05/18/14 0016     05/17/14 2300  ciprofloxacin (CIPRO) IVPB 400 mg     400 mg200 mL/hr over 60 Minutes Intravenous  Once 05/17/14 2255 05/18/14 0010   05/17/14 2300  metroNIDAZOLE (FLAGYL) IVPB 500 mg     500 mg100 mL/hr over 60 Minutes Intravenous  Once 05/17/14 2255 05/18/14 0016  Subjective:   Nicole Barry seen and examined today.  Patient states that her abdomen still hurts however she denied any nausea or vomiting. She denies any shortness of breath, chest pain, headache, dizziness. Patient would like to try to drink something.  Objective:   Filed Vitals:   05/17/14 2345 05/18/14 0012 05/18/14 0445 05/18/14 1108  BP: 109/64 120/51 122/51 105/58  Pulse: 80 80 88 86  Temp:  99.4 F (37.4 C) 100.5 F (38.1 C) 101.2 F (38.4 C)  TempSrc:  Oral Oral Oral  Resp: 18 17 20 20   Height:  4\' 10"  (1.473 m)    Weight:  87.2 kg (192 lb 3.9 oz)    SpO2: 97% 97% 93% 90%    Wt Readings from Last 3 Encounters:  05/18/14 87.2 kg (192 lb 3.9 oz)  07/08/13 86.183 kg (190 lb)  07/11/12 90.538 kg (199 lb 9.6 oz)     Intake/Output Summary (Last  24 hours) at 05/18/14 1239 Last data filed at 05/17/14 2159  Gross per 24 hour  Intake   1000 ml  Output      0 ml  Net   1000 ml    Exam  General: Well developed, well nourished, NAD, appears stated age  HEENT: NCAT, PERRLA, EOMI, Anicteic Sclera, mucous membranes moist.   Cardiovascular: S1 S2 auscultated, no rubs, murmurs or gallops. Regular rate and rhythm.  Respiratory: Clear to auscultation bilaterally with equal chest rise  Abdomen: Soft, obese, mildly tender in the infraumbilical area, nondistended, + bowel sounds  Extremities: warm dry without cyanosis clubbing or edema  Neuro: AAOx3, cranial nerves grossly intact. Strength 5/5 in patient's upper and lower extremities bilaterally  Skin: Without rashes exudates or nodules  Psych: Normal affect and demeanor with intact judgement and insight  Data Review   Micro Results No results found for this or any previous visit (from the past 240 hour(s)).  Radiology Reports Ct Abdomen Pelvis W Contrast  05/17/2014   CLINICAL DATA:  Acute onset of periumbilical and right lower quadrant abdominal pain and tenderness, with nausea. Initial encounter.  EXAM: CT ABDOMEN AND PELVIS WITH CONTRAST  TECHNIQUE: Multidetector CT imaging of the abdomen and pelvis was performed using the standard protocol following bolus administration of intravenous contrast.  CONTRAST:  100mL OMNIPAQUE IOHEXOL 300 MG/ML  SOLN  COMPARISON:  CT of the abdomen performed 04/17/2005; the pelvic CT is not available for comparison. Pelvic ultrasound from 04/19/2003  FINDINGS: The visualized lung bases are clear. Mild coronary artery calcification is noted.  The liver and spleen are unremarkable in appearance. The gallbladder is within normal limits. The pancreas and adrenal glands are unremarkable.  The kidneys are unremarkable in appearance. There is no evidence of hydronephrosis. No renal or ureteral stones are seen. No perinephric stranding is appreciated.  The small  bowel is unremarkable in appearance. The stomach is within normal limits. No acute vascular abnormalities are seen.  The appendix is normal in caliber, without evidence for appendicitis. Contrast progresses to the level of the proximal transverse colon. A few diverticula are noted along the mid sigmoid colon.  Note is made of focal soft tissue inflammation at the mid sigmoid colon, with mild associated wall thickening, and question of an inflamed diverticulum. This is suspicious for mild acute diverticulitis. Trace associated free fluid is seen, with soft tissue inflammation extending about the adjacent right ovary and uterus. There is no evidence of perforation or abscess formation at this time.  The bladder is mildly distended and  grossly unremarkable. The uterus is otherwise unremarkable in appearance. The ovaries are relatively symmetric, aside from mild soft tissue inflammation and fluid about the right ovary. No inguinal lymphadenopathy is seen.  No acute osseous abnormalities are identified. 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  IMPRESSION: 1. Mild acute diverticulitis at the mid sigmoid colon, with focal soft tissue inflammation and mild colonic wall thickening. Trace associated free fluid. Soft tissue inflammation extends about the adjacent right ovary and uterus. No evidence of perforation or abscess formation at this time. 2. Few diverticula noted along the mid sigmoid colon. 3. Mild coronary artery calcification noted. 4. Chronic bilateral pars defects at L5, with associated grade 1 anterolisthesis of L5 on S1, and underlying mild degenerative change.   Electronically Signed   By: Roanna RaiderJeffery  Chang M.D.   On: 05/17/2014 22:32    CBC  Recent Labs Lab 05/17/14 2028 05/18/14 0421  WBC 16.0* 13.5*  HGB 14.1 12.9  HCT 41.8 38.3  PLT 301 229  MCV 89.9 91.8  MCH 30.3 30.9  MCHC 33.7 33.7  RDW  12.2 12.3  LYMPHSABS 1.2 1.5  MONOABS 1.4* 1.4*  EOSABS 0.0 0.1  BASOSABS 0.0 0.0    Chemistries   Recent Labs Lab 05/17/14 2028 05/18/14 0421  NA 135* 139  K 3.9 4.3  CL 93* 99  CO2 27 28  GLUCOSE 147* 154*  BUN 12 11  CREATININE 0.75 0.75  CALCIUM 9.7 8.7  AST 14 13  ALT 18 15  ALKPHOS 70 65  BILITOT 0.7 0.7   ------------------------------------------------------------------------------------------------------------------ estimated creatinine clearance is 72.8 mL/min (by C-G formula based on Cr of 0.75). ------------------------------------------------------------------------------------------------------------------ No results for input(s): HGBA1C in the last 72 hours. ------------------------------------------------------------------------------------------------------------------ No results for input(s): CHOL, HDL, LDLCALC, TRIG, CHOLHDL, LDLDIRECT in the last 72 hours. ------------------------------------------------------------------------------------------------------------------ No results for input(s): TSH, T4TOTAL, T3FREE, THYROIDAB in the last 72 hours.  Invalid input(s): FREET3 ------------------------------------------------------------------------------------------------------------------ No results for input(s): VITAMINB12, FOLATE, FERRITIN, TIBC, IRON, RETICCTPCT in the last 72 hours.  Coagulation profile No results for input(s): INR, PROTIME in the last 168 hours.  No results for input(s): DDIMER in the last 72 hours.  Cardiac Enzymes No results for input(s): CKMB, TROPONINI, MYOGLOBIN in the last 168 hours.  Invalid input(s): CK ------------------------------------------------------------------------------------------------------------------ Invalid input(s): POCBNP    Kimothy Kishimoto D.O. on 05/18/2014 at 12:39 PM  Between 7am to 7pm - Pager - [phone]  After 7pm go to [email or web address] - password [email or web address]  And look for the night coverage  person covering for me after hours  [facility] Office  [phone]

## 2014-05-18 NOTE — Progress Notes (Signed)
58yo female c/o RLQ and mid-abdominal pain, CT reveals diverticulitis w/ focal soft tissue inflammation and wall thickening, to begin IV ABX.  Will start Cipro 400mg  IV Q12H for CrCl ~70 ml/min and monitor CBC and Cx.  Nicole Barry, PharmD, BCPS 05/18/2014 12:32 AM

## 2014-05-19 DIAGNOSIS — D649 Anemia, unspecified: Secondary | ICD-10-CM

## 2014-05-19 DIAGNOSIS — A419 Sepsis, unspecified organism: Principal | ICD-10-CM

## 2014-05-19 DIAGNOSIS — F32A Depression, unspecified: Secondary | ICD-10-CM | POA: Diagnosis present

## 2014-05-19 DIAGNOSIS — F418 Other specified anxiety disorders: Secondary | ICD-10-CM

## 2014-05-19 HISTORY — DX: Sepsis, unspecified organism: A41.9

## 2014-05-19 HISTORY — DX: Anemia, unspecified: D64.9

## 2014-05-19 LAB — CBC
HCT: 34 % — ABNORMAL LOW (ref 36.0–46.0)
Hemoglobin: 11.1 g/dL — ABNORMAL LOW (ref 12.0–15.0)
MCH: 29.7 pg (ref 26.0–34.0)
MCHC: 32.6 g/dL (ref 30.0–36.0)
MCV: 90.9 fL (ref 78.0–100.0)
PLATELETS: 217 10*3/uL (ref 150–400)
RBC: 3.74 MIL/uL — AB (ref 3.87–5.11)
RDW: 12.1 % (ref 11.5–15.5)
WBC: 10.5 10*3/uL (ref 4.0–10.5)

## 2014-05-19 LAB — BASIC METABOLIC PANEL
ANION GAP: 12 (ref 5–15)
BUN: 7 mg/dL (ref 6–23)
CALCIUM: 8.8 mg/dL (ref 8.4–10.5)
CO2: 25 mEq/L (ref 19–32)
Chloride: 96 mEq/L (ref 96–112)
Creatinine, Ser: 0.63 mg/dL (ref 0.50–1.10)
Glucose, Bld: 109 mg/dL — ABNORMAL HIGH (ref 70–99)
Potassium: 4.2 mEq/L (ref 3.7–5.3)
SODIUM: 133 meq/L — AB (ref 137–147)

## 2014-05-19 LAB — GLUCOSE, CAPILLARY
Glucose-Capillary: 107 mg/dL — ABNORMAL HIGH (ref 70–99)
Glucose-Capillary: 113 mg/dL — ABNORMAL HIGH (ref 70–99)
Glucose-Capillary: 73 mg/dL (ref 70–99)

## 2014-05-19 MED ORDER — ALPRAZOLAM 0.5 MG PO TABS
0.5000 mg | ORAL_TABLET | Freq: Two times a day (BID) | ORAL | Status: DC | PRN
  Administered 2014-05-20 (×2): 0.5 mg via ORAL
  Filled 2014-05-19 (×2): qty 1

## 2014-05-19 MED ORDER — ESCITALOPRAM OXALATE 20 MG PO TABS
20.0000 mg | ORAL_TABLET | Freq: Every day | ORAL | Status: DC
  Administered 2014-05-19 – 2014-05-21 (×3): 20 mg via ORAL
  Filled 2014-05-19 (×3): qty 1

## 2014-05-19 MED ORDER — ESCITALOPRAM OXALATE 10 MG PO TABS
ORAL_TABLET | ORAL | Status: AC
  Filled 2014-05-19: qty 2

## 2014-05-19 MED ORDER — HYDROMORPHONE HCL 1 MG/ML IJ SOLN
INTRAMUSCULAR | Status: AC
  Filled 2014-05-19: qty 1

## 2014-05-19 NOTE — Progress Notes (Signed)
[facility]                                                                              Patient Demographics  Nicole Barry, is a 58 y.o. female, DOB - 25-Jul-1955, AYT:[id number]  Admit date - 05/17/2014   Admitting Physician Eduard Clos, MD  Outpatient Primary MD for the patient is PROVIDER NOT IN SYSTEM  LOS - 2   Chief Complaint  Patient presents with  . Abdominal Pain  . Nausea      HPI on 05/17/2014 by Dr. Midge Minium Nicole Barry is a 58 y.o. female with history of hypertension and depression presents to the ER because of 2 days of abdominal pain. Patient abdominal is mostly periumbilical. Patient has had some nausea but denies any vomiting or diarrhea. Patient's pain is colicky in nature. On exam patient has mild tenderness mostly in the infraumbilical area. CT abdomen and pelvis done shows sigmoid diverticulitis with no abscess or microperforation. Patient has been admitted for further management. Patient states she has had colonoscopy done 5 years ago and is due for one next year. The previous one had shown polyps. It was done at [facility]. Patient otherwise denies any fever chills chest pain or shortness of breath. Denies any recent use of antibiotics.  Assessment & Plan   Sepsis secondary to acute sigmoid diverticulitis -Patient continues to be febrile however leukocytosis has resolved -CT of the abdomen/pelvis: Mild acute diverticulitis at the mid sigmoid colon -Continue ciprofloxacin and Flagyl -Patient tolerated clear liquid diet, will advance to full liquid.  Essential hypertension -Stable -Continue hydralazine IV as needed for systolic pressures above 160 -Holding lisinopril and HCTZ  Depression -Will restart Lexapro and Xanax   Dehydration with hyponatremia and hypochloremia -Resolved with IVF, will continue to monitor BMP  Normocytic anemia -Likely dilutional component -Will continue to monitor CBC  Code Status: Full  Family Communication:  None at bedside  Disposition Plan: Admitted.  If patient improves, and remains afebrile, will likely discharge on 05/20/2014  Time Spent in minutes   30 minutes  Procedures  None  Consults   None  DVT Prophylaxis  Lovenox  Lab Results  Component Value Date   PLT 217 05/19/2014    Medications  Scheduled Meds: . ciprofloxacin  400 mg Intravenous Q12H  . enoxaparin (LOVENOX) injection  40 mg Subcutaneous Q24H  . metronidazole  500 mg Intravenous Q8H   Continuous Infusions:   PRN Meds:.acetaminophen **OR** acetaminophen, albuterol, hydrALAZINE, HYDROmorphone (DILAUDID) injection, ondansetron **OR** ondansetron (ZOFRAN) IV  Antibiotics    Anti-infectives    Start     Dose/Rate Route Frequency Ordered Stop   05/18/14 1000  ciprofloxacin (CIPRO) IVPB 400 mg     400 mg200 mL/hr over 60 Minutes Intravenous Every 12 hours 05/18/14 0029     05/18/14 0600  metroNIDAZOLE (FLAGYL) IVPB 500 mg     500 mg100 mL/hr over 60 Minutes Intravenous Every 8 hours 05/18/14 0016     05/17/14 2301  metroNIDAZOLE (FLAGYL) 5-0.79 MG/ML-% IVPB    Comments:  Duncan Dull   : cabinet override      05/17/14 2301 05/18/14 1114   05/17/14 2301  ciprofloxacin (CIPRO)  400 MG/200ML IVPB    Comments:  Duncan DullLambert, Rebecca   : cabinet override      05/17/14 2301 05/18/14 1114   05/17/14 2300  ciprofloxacin (CIPRO) IVPB 400 mg     400 mg200 mL/hr over 60 Minutes Intravenous  Once 05/17/14 2255 05/18/14 0010   05/17/14 2300  metroNIDAZOLE (FLAGYL) IVPB 500 mg     500 mg100 mL/hr over 60 Minutes Intravenous  Once 05/17/14 2255 05/18/14 0016        Subjective:   Nicole Barry seen and examined today. Patient states she was able to tolerate clear liquids yesterday and was unable to go home today. However she continues to have fever. Patient denies any nausea or vomiting or abdominal pain.   Objective:   Filed Vitals:   05/18/14 2300 05/19/14 0626 05/19/14 0808 05/19/14 0830  BP:  121/48  111/54  Pulse:   86  71  Temp: 99.3 F (37.4 C) 102.1 F (38.9 C) 99.7 F (37.6 C)   TempSrc:  Oral Oral   Resp:  20  18  Height:      Weight:      SpO2:  92%  97%    Wt Readings from Last 3 Encounters:  05/18/14 87.2 kg (192 lb 3.9 oz)  07/08/13 86.183 kg (190 lb)  07/11/12 90.538 kg (199 lb 9.6 oz)     Intake/Output Summary (Last 24 hours) at 05/19/14 0953 Last data filed at 05/18/14 1345  Gross per 24 hour  Intake 1579.17 ml  Output      0 ml  Net 1579.17 ml    Exam  General: Well developed, well nourished, NAD, appears stated age  HEENT: NCAT, mucous membranes moist.   Cardiovascular: S1 S2 auscultated, no rubs, murmurs or gallops. Regular rate and rhythm.  Respiratory: Clear to auscultation bilaterally with equal chest rise  Abdomen: Soft, obese, mildly tender in the infraumbilical area, nondistended, + bowel sounds  Extremities: warm dry without cyanosis clubbing or edema  Neuro: AAOx3,no focal deficits   Psych: Appropriate mood and affect  Data Review   Micro Results No results found for this or any previous visit (from the past 240 hour(s)).  Radiology Reports Ct Abdomen Pelvis W Contrast  05/17/2014   CLINICAL DATA:  Acute onset of periumbilical and right lower quadrant abdominal pain and tenderness, with nausea. Initial encounter.  EXAM: CT ABDOMEN AND PELVIS WITH CONTRAST  TECHNIQUE: Multidetector CT imaging of the abdomen and pelvis was performed using the standard protocol following bolus administration of intravenous contrast.  CONTRAST:  100mL OMNIPAQUE IOHEXOL 300 MG/ML  SOLN  COMPARISON:  CT of the abdomen performed 04/17/2005; the pelvic CT is not available for comparison. Pelvic ultrasound from 04/19/2003  FINDINGS: The visualized lung bases are clear. Mild coronary artery calcification is noted.  The liver and spleen are unremarkable in appearance. The gallbladder is within normal limits. The pancreas and adrenal glands are unremarkable.  The kidneys are  unremarkable in appearance. There is no evidence of hydronephrosis. No renal or ureteral stones are seen. No perinephric stranding is appreciated.  The small bowel is unremarkable in appearance. The stomach is within normal limits. No acute vascular abnormalities are seen.  The appendix is normal in caliber, without evidence for appendicitis. Contrast progresses to the level of the proximal transverse colon. A few diverticula are noted along the mid sigmoid colon.  Note is made of focal soft tissue inflammation at the mid sigmoid colon, with mild associated wall thickening, and question of  an inflamed diverticulum. This is suspicious for mild acute diverticulitis. Trace associated free fluid is seen, with soft tissue inflammation extending about the adjacent right ovary and uterus. There is no evidence of perforation or abscess formation at this time.  The bladder is mildly distended and grossly unremarkable. The uterus is otherwise unremarkable in appearance. The ovaries are relatively symmetric, aside from mild soft tissue inflammation and fluid about the right ovary. No inguinal lymphadenopathy is seen.  No acute osseous abnormalities are identified. 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  IMPRESSION: 1. Mild acute diverticulitis at the mid sigmoid colon, with focal soft tissue inflammation and mild colonic wall thickening. Trace associated free fluid. Soft tissue inflammation extends about the adjacent right ovary and uterus. No evidence of perforation or abscess formation at this time. 2. Few diverticula noted along the mid sigmoid colon. 3. Mild coronary artery calcification noted. 4. Chronic bilateral pars defects at L5, with associated grade 1 anterolisthesis of L5 on S1, and underlying mild degenerative change.   Electronically Signed   By: Roanna RaiderJeffery  Chang M.D.   On: 05/17/2014 22:32    CBC  Recent  Labs Lab 05/17/14 2028 05/18/14 0421 05/19/14 0556  WBC 16.0* 13.5* 10.5  HGB 14.1 12.9 11.1*  HCT 41.8 38.3 34.0*  PLT 301 229 217  MCV 89.9 91.8 90.9  MCH 30.3 30.9 29.7  MCHC 33.7 33.7 32.6  RDW 12.2 12.3 12.1  LYMPHSABS 1.2 1.5  --   MONOABS 1.4* 1.4*  --   EOSABS 0.0 0.1  --   BASOSABS 0.0 0.0  --     Chemistries   Recent Labs Lab 05/17/14 2028 05/18/14 0421 05/19/14 0556  NA 135* 139 133*  K 3.9 4.3 4.2  CL 93* 99 96  CO2 27 28 25   GLUCOSE 147* 154* 109*  BUN 12 11 7   CREATININE 0.75 0.75 0.63  CALCIUM 9.7 8.7 8.8  AST 14 13  --   ALT 18 15  --   ALKPHOS 70 65  --   BILITOT 0.7 0.7  --    ------------------------------------------------------------------------------------------------------------------ estimated creatinine clearance is 72.8 mL/min (by C-G formula based on Cr of 0.63). ------------------------------------------------------------------------------------------------------------------ No results for input(s): HGBA1C in the last 72 hours. ------------------------------------------------------------------------------------------------------------------ No results for input(s): CHOL, HDL, LDLCALC, TRIG, CHOLHDL, LDLDIRECT in the last 72 hours. ------------------------------------------------------------------------------------------------------------------ No results for input(s): TSH, T4TOTAL, T3FREE, THYROIDAB in the last 72 hours.  Invalid input(s): FREET3 ------------------------------------------------------------------------------------------------------------------ No results for input(s): VITAMINB12, FOLATE, FERRITIN, TIBC, IRON, RETICCTPCT in the last 72 hours.  Coagulation profile No results for input(s): INR, PROTIME in the last 168 hours.  No results for input(s): DDIMER in the last 72 hours.  Cardiac Enzymes No results for input(s): CKMB, TROPONINI, MYOGLOBIN in the last 168 hours.  Invalid input(s):  CK ------------------------------------------------------------------------------------------------------------------ Invalid input(s): POCBNP    Mayleigh Tetrault D.O. on 05/19/2014 at 9:53 AM  Between 7am to 7pm - Pager - [phone]  After 7pm go to [email or web address] - password [email or web address]  And look for the night coverage person covering for me after hours  [facility] Office  [phone]

## 2014-05-20 LAB — CBC
HEMATOCRIT: 32.5 % — AB (ref 36.0–46.0)
HEMOGLOBIN: 11.2 g/dL — AB (ref 12.0–15.0)
MCH: 31.5 pg (ref 26.0–34.0)
MCHC: 34.5 g/dL (ref 30.0–36.0)
MCV: 91.3 fL (ref 78.0–100.0)
Platelets: 225 10*3/uL (ref 150–400)
RBC: 3.56 MIL/uL — AB (ref 3.87–5.11)
RDW: 11.7 % (ref 11.5–15.5)
WBC: 7.1 10*3/uL (ref 4.0–10.5)

## 2014-05-20 LAB — BASIC METABOLIC PANEL
Anion gap: 13 (ref 5–15)
BUN: 8 mg/dL (ref 6–23)
CALCIUM: 8.4 mg/dL (ref 8.4–10.5)
CO2: 25 meq/L (ref 19–32)
CREATININE: 0.62 mg/dL (ref 0.50–1.10)
Chloride: 98 mEq/L (ref 96–112)
GFR calc Af Amer: >90 mL/min (ref >90)
GFR calc non Af Amer: >90 mL/min (ref >90)
Glucose, Bld: 106 mg/dL — ABNORMAL HIGH (ref 70–99)
Potassium: 3.9 mEq/L (ref 3.7–5.3)
Sodium: 136 mEq/L — ABNORMAL LOW (ref 137–147)

## 2014-05-20 LAB — GLUCOSE, CAPILLARY
GLUCOSE-CAPILLARY: 92 mg/dL (ref 70–99)
GLUCOSE-CAPILLARY: 123 mg/dL — AB (ref 70–99)
Glucose-Capillary: 107 mg/dL — ABNORMAL HIGH (ref 70–99)
Glucose-Capillary: 81 mg/dL (ref 70–99)

## 2014-05-20 MED ORDER — FAMOTIDINE 20 MG PO TABS
20.0000 mg | ORAL_TABLET | Freq: Two times a day (BID) | ORAL | Status: DC
  Administered 2014-05-20 – 2014-05-21 (×3): 20 mg via ORAL
  Filled 2014-05-20 (×4): qty 1

## 2014-05-20 NOTE — Progress Notes (Signed)
UR Completed.  [phone]  

## 2014-05-20 NOTE — Progress Notes (Signed)
[facility]                                                                              Patient Demographics  Nicole Barry, is a 58 y.o. female, DOB - 03-08-1956, ZOX:[id number]  Admit date - 05/17/2014   Admitting Physician Eduard Clos, MD  Outpatient Primary MD for the patient is PROVIDER NOT IN SYSTEM  LOS - 3   Chief Complaint  Patient presents with  . Abdominal Pain  . Nausea      HPI on 05/17/2014 by Dr. Midge Minium Nicole Barry is a 58 y.o. female with history of hypertension and depression presents to the ER because of 2 days of abdominal pain. Patient abdominal is mostly periumbilical. Patient has had some nausea but denies any vomiting or diarrhea. Patient's pain is colicky in nature. On exam patient has mild tenderness mostly in the infraumbilical area. CT abdomen and pelvis done shows sigmoid diverticulitis with no abscess or microperforation. Patient has been admitted for further management. Patient states she has had colonoscopy done 5 years ago and is due for one next year. The previous one had shown polyps. It was done at [facility]. Patient otherwise denies any fever chills chest pain or shortness of breath. Denies any recent use of antibiotics.  Assessment & Plan   Sepsis secondary to acute sigmoid diverticulitis -Patient continues to be febrile however leukocytosis has resolved -CT of the abdomen/pelvis: Mild acute diverticulitis at the mid sigmoid colon -Continue ciprofloxacin and Flagyl -Patient tolerated clear liquid diet, will advance to full liquid. -Pending blood cultures  Essential hypertension -Stable -Continue hydralazine IV as needed for systolic pressures above 160 -Holding lisinopril and HCTZ  Depression -Continue Lexapro and Xanax   Dehydration with hyponatremia and hypochloremia -Resolved with IVF, will continue to monitor BMP  Normocytic anemia -Likely dilutional component -Will continue to monitor CBC  Code Status:  Full  Family Communication: None at bedside  Disposition Plan: Admitted.  If patient improves, and remains afebrile, will likely discharge on 05/20/2014  Time Spent in minutes   30 minutes  Procedures  None  Consults   None  DVT Prophylaxis  Lovenox  Lab Results  Component Value Date   PLT 225 05/20/2014    Medications  Scheduled Meds: . ciprofloxacin  400 mg Intravenous Q12H  . enoxaparin (LOVENOX) injection  40 mg Subcutaneous Q24H  . escitalopram  20 mg Oral Daily  . famotidine  20 mg Oral BID  . metronidazole  500 mg Intravenous Q8H   Continuous Infusions:   PRN Meds:.acetaminophen **OR** acetaminophen, albuterol, ALPRAZolam, hydrALAZINE, HYDROmorphone (DILAUDID) injection, ondansetron **OR** ondansetron (ZOFRAN) IV  Antibiotics    Anti-infectives    Start     Dose/Rate Route Frequency Ordered Stop   05/18/14 1000  ciprofloxacin (CIPRO) IVPB 400 mg     400 mg200 mL/hr over 60 Minutes Intravenous Every 12 hours 05/18/14 0029     05/18/14 0600  metroNIDAZOLE (FLAGYL) IVPB 500 mg     500 mg100 mL/hr over 60 Minutes Intravenous Every 8 hours 05/18/14 0016     05/17/14 2301  metroNIDAZOLE (FLAGYL) 5-0.79 MG/ML-% IVPB    Comments:  Duncan Dull   :  cabinet override      05/17/14 2301 05/18/14 1114   05/17/14 2300  ciprofloxacin (CIPRO) IVPB 400 mg     400 mg200 mL/hr over 60 Minutes Intravenous  Once 05/17/14 2255 05/18/14 0010   05/17/14 2300  metroNIDAZOLE (FLAGYL) IVPB 500 mg     500 mg100 mL/hr over 60 Minutes Intravenous  Once 05/17/14 2255 05/18/14 0016        Subjective:   Nicole ApaIda Greenbaum seen and examined today. Patient states she had a fever overnight, however, she is feeling slightly better.  She denies nausea or vomiting, constipation, chest pain, SOB.  She does complain of watery stools.   Objective:   Filed Vitals:   05/19/14 1041 05/19/14 1322 05/19/14 2225 05/20/14 0625  BP:  114/58 125/51 142/54  Pulse:  72 82 76  Temp: 100.3 F (37.9 C)  100.7 F (38.2 C) 102.2 F (39 C) 99.8 F (37.7 C)  TempSrc:  Oral Oral Oral  Resp:  18 17 16   Height:      Weight:      SpO2:  96% 94% 96%    Wt Readings from Last 3 Encounters:  05/18/14 87.2 kg (192 lb 3.9 oz)  07/08/13 86.183 kg (190 lb)  07/11/12 90.538 kg (199 lb 9.6 oz)     Intake/Output Summary (Last 24 hours) at 05/20/14 1036 Last data filed at 05/19/14 1352  Gross per 24 hour  Intake      0 ml  Output      0 ml  Net      0 ml    Exam  General: Well developed, well nourished, NAD, appears stated age  HEENT: NCAT, mucous membranes moist.   Cardiovascular: S1 S2 auscultated, no rubs, murmurs or gallops. Regular rate and rhythm.  Respiratory: Clear to auscultation bilaterally with equal chest rise  Abdomen: Soft, obese, mildly tender in the infraumbilical area, nondistended, + bowel sounds  Extremities: warm dry without cyanosis clubbing or edema  Neuro: AAOx3,no focal deficits   Psych: Appropriate mood and affect  Data Review   Micro Results No results found for this or any previous visit (from the past 240 hour(s)).  Radiology Reports Ct Abdomen Pelvis W Contrast  05/17/2014   CLINICAL DATA:  Acute onset of periumbilical and right lower quadrant abdominal pain and tenderness, with nausea. Initial encounter.  EXAM: CT ABDOMEN AND PELVIS WITH CONTRAST  TECHNIQUE: Multidetector CT imaging of the abdomen and pelvis was performed using the standard protocol following bolus administration of intravenous contrast.  CONTRAST:  100mL OMNIPAQUE IOHEXOL 300 MG/ML  SOLN  COMPARISON:  CT of the abdomen performed 04/17/2005; the pelvic CT is not available for comparison. Pelvic ultrasound from 04/19/2003  FINDINGS: The visualized lung bases are clear. Mild coronary artery calcification is noted.  The liver and spleen are unremarkable in appearance. The gallbladder is within normal limits. The pancreas and adrenal glands are unremarkable.  The kidneys are unremarkable in  appearance. There is no evidence of hydronephrosis. No renal or ureteral stones are seen. No perinephric stranding is appreciated.  The small bowel is unremarkable in appearance. The stomach is within normal limits. No acute vascular abnormalities are seen.  The appendix is normal in caliber, without evidence for appendicitis. Contrast progresses to the level of the proximal transverse colon. A few diverticula are noted along the mid sigmoid colon.  Note is made of focal soft tissue inflammation at the mid sigmoid colon, with mild associated wall thickening, and question of an inflamed  diverticulum. This is suspicious for mild acute diverticulitis. Trace associated free fluid is seen, with soft tissue inflammation extending about the adjacent right ovary and uterus. There is no evidence of perforation or abscess formation at this time.  The bladder is mildly distended and grossly unremarkable. The uterus is otherwise unremarkable in appearance. The ovaries are relatively symmetric, aside from mild soft tissue inflammation and fluid about the right ovary. No inguinal lymphadenopathy is seen.  No acute osseous abnormalities are identified. 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  IMPRESSION: 1. Mild acute diverticulitis at the mid sigmoid colon, with focal soft tissue inflammation and mild colonic wall thickening. Trace associated free fluid. Soft tissue inflammation extends about the adjacent right ovary and uterus. No evidence of perforation or abscess formation at this time. 2. Few diverticula noted along the mid sigmoid colon. 3. Mild coronary artery calcification noted. 4. Chronic bilateral pars defects at L5, with associated grade 1 anterolisthesis of L5 on S1, and underlying mild degenerative change.   Electronically Signed   By: Roanna RaiderJeffery  Chang M.D.   On: 05/17/2014 22:32    CBC  Recent Labs Lab  05/17/14 2028 05/18/14 0421 05/19/14 0556 05/20/14 0856  WBC 16.0* 13.5* 10.5 7.1  HGB 14.1 12.9 11.1* 11.2*  HCT 41.8 38.3 34.0* 32.5*  PLT 301 229 217 225  MCV 89.9 91.8 90.9 91.3  MCH 30.3 30.9 29.7 31.5  MCHC 33.7 33.7 32.6 34.5  RDW 12.2 12.3 12.1 11.7  LYMPHSABS 1.2 1.5  --   --   MONOABS 1.4* 1.4*  --   --   EOSABS 0.0 0.1  --   --   BASOSABS 0.0 0.0  --   --     Chemistries   Recent Labs Lab 05/17/14 2028 05/18/14 0421 05/19/14 0556 05/20/14 0856  NA 135* 139 133* 136*  K 3.9 4.3 4.2 3.9  CL 93* 99 96 98  CO2 27 28 25 25   GLUCOSE 147* 154* 109* 106*  BUN 12 11 7 8   CREATININE 0.75 0.75 0.63 0.62  CALCIUM 9.7 8.7 8.8 8.4  AST 14 13  --   --   ALT 18 15  --   --   ALKPHOS 70 65  --   --   BILITOT 0.7 0.7  --   --    ------------------------------------------------------------------------------------------------------------------ estimated creatinine clearance is 72.8 mL/min (by C-G formula based on Cr of 0.62). ------------------------------------------------------------------------------------------------------------------ No results for input(s): HGBA1C in the last 72 hours. ------------------------------------------------------------------------------------------------------------------ No results for input(s): CHOL, HDL, LDLCALC, TRIG, CHOLHDL, LDLDIRECT in the last 72 hours. ------------------------------------------------------------------------------------------------------------------ No results for input(s): TSH, T4TOTAL, T3FREE, THYROIDAB in the last 72 hours.  Invalid input(s): FREET3 ------------------------------------------------------------------------------------------------------------------ No results for input(s): VITAMINB12, FOLATE, FERRITIN, TIBC, IRON, RETICCTPCT in the last 72 hours.  Coagulation profile No results for input(s): INR, PROTIME in the last 168 hours.  No results for input(s): DDIMER in the last 72 hours.  Cardiac  Enzymes No results for input(s): CKMB, TROPONINI, MYOGLOBIN in the last 168 hours.  Invalid input(s): CK ------------------------------------------------------------------------------------------------------------------ Invalid input(s): POCBNP    Monica Codd D.O. on 05/20/2014 at 10:36 AM  Between 7am to 7pm - Pager - [phone]  After 7pm go to [email or web address] - password [email or web address]  And look for the night coverage person covering for me after hours  [facility] Office  [phone]

## 2014-05-21 LAB — GLUCOSE, CAPILLARY
GLUCOSE-CAPILLARY: 140 mg/dL — AB (ref 70–99)
Glucose-Capillary: 119 mg/dL — ABNORMAL HIGH (ref 70–99)
Glucose-Capillary: 124 mg/dL — ABNORMAL HIGH (ref 70–99)

## 2014-05-21 MED ORDER — FLUCONAZOLE 150 MG PO TABS: 150.0000 mg | ORAL_TABLET | Freq: Every day | ORAL | Status: DC

## 2014-05-21 MED ORDER — HYDROCODONE-ACETAMINOPHEN 5-325 MG PO TABS: 1.0000 | ORAL_TABLET | Freq: Four times a day (QID) | ORAL | Status: DC | PRN

## 2014-05-21 MED ORDER — HYDROCODONE-ACETAMINOPHEN 5-325 MG PO TABS
1.0000 | ORAL_TABLET | Freq: Once | ORAL | Status: AC
  Administered 2014-05-21: 1 via ORAL
  Filled 2014-05-21: qty 1

## 2014-05-21 MED ORDER — CIPROFLOXACIN HCL 500 MG PO TABS: 500.0000 mg | ORAL_TABLET | Freq: Two times a day (BID) | ORAL | Status: DC

## 2014-05-21 MED ORDER — METRONIDAZOLE 500 MG PO TABS: 500.0000 mg | ORAL_TABLET | Freq: Three times a day (TID) | ORAL | Status: DC

## 2014-05-21 NOTE — Discharge Instructions (Signed)

## 2014-05-21 NOTE — Discharge Summary (Signed)
Physician Discharge Summary  Nicole Barry NGE:[id number] DOB: 1955/07/22 DOA: 05/17/2014  PCP: PROVIDER NOT IN SYSTEM  Admit date: 05/17/2014 Discharge date: 05/21/2014  Time spent: 45 minutes  Recommendations for Outpatient Follow-up:  Patient will be discharged to home.  She is to follow up with her primary care physician within one week of discharge. Patient will need to continue her medications as prescribed. She should follow a soft diet advanced as tolerated. patient may resume activity as tolerated.  Discharge Diagnoses:  Principal Problem:   Sepsis Active Problems:   Acute diverticulitis   Hypertension   Diverticulitis   Normocytic anemia   Depression with anxiety   Discharge Condition: Stable  Diet recommendation: soft diet, advance to heart healthy as tolerated  Filed Weights   05/17/14 2027 05/18/14 0012  Weight: 81.647 kg (180 lb) 87.2 kg (192 lb 3.9 oz)    History of present illness:  on 05/17/2014 by Dr. Midge MiniumArshad Kakrakandy Nicole Barry is a 58 y.o. female with history of hypertension and depression presents to the ER because of 2 days of abdominal pain. Patient abdominal is mostly periumbilical. Patient has had some nausea but denies any vomiting or diarrhea. Patient's pain is colicky in nature. On exam patient has mild tenderness mostly in the infraumbilical area. CT abdomen and pelvis done shows sigmoid diverticulitis with no abscess or microperforation. Patient has been admitted for further management. Patient states she has had colonoscopy done 5 years ago and is due for one next year. The previous one had shown polyps. It was done [facility]. Patient otherwise denies any fever chills chest pain or shortness of breath. Denies any recent use of antibiotics.  Hospital Course:  Sepsis secondary to acute sigmoid diverticulitis -Patient has remained afebrile for the past 24 hours, leukocytosis has resolved -CT of the abdomen/pelvis: Mild acute diverticulitis at the mid  sigmoid colon -Continue ciprofloxacin and Flagyl -Continue soft diet advanced to heart healthy as tolerated -Pending blood cultures  Essential hypertension -Stable -Held lisinopril and HCTZ but may resume at discharge   Depression -Continue Lexapro and Xanax   Dehydration with hyponatremia and hypochloremia -Resolved with IVF  Normocytic anemia -Likely dilutional component -Hemoglobin remained stable  Procedures: None  Consultations: None  Discharge Exam: Filed Vitals:   05/21/14 0625  BP: 129/66  Pulse: 69  Temp: 98.5 F (36.9 C)  Resp: 16     General: Well developed, well nourished, NAD, appears stated age  HEENT: NCAT, mucous membranes moist.  Cardiovascular: S1 S2 auscultated, no rubs, murmurs or gallops. Regular rate and rhythm.  Respiratory: Clear to auscultation bilaterally with equal chest rise  Abdomen: Soft, obese, nontender, nondistended, + bowel sounds  Extremities: warm dry without cyanosis clubbing or edema  Neuro: AAOx3, no focal deficits  Psych: Normal affect and demeanor with intact judgement and insight  Discharge Instructions      Discharge Instructions    Discharge instructions    Complete by:  As directed   Patient will be discharged to home.  She is to follow up with her primary care physician within one week of discharge. Patient will need to continue her medications as prescribed. She should follow a soft diet advanced as tolerated. patient may resume activity as tolerated.            Medication List    TAKE these medications        ALPRAZolam 0.5 MG tablet  Commonly known as:  XANAX  Take 0.5 mg by mouth 2 (  two) times daily as needed.     ciprofloxacin 500 MG tablet  Commonly known as:  CIPRO  Take 1 tablet (500 mg total) by mouth 2 (two) times daily.     escitalopram 20 MG tablet  Commonly known as:  LEXAPRO  Take 20 mg by mouth daily.     lisinopril-hydrochlorothiazide 10-12.5 MG per tablet  Commonly known as:   PRINZIDE,ZESTORETIC  Take 1 tablet by mouth daily.     meloxicam 7.5 MG tablet  Commonly known as:  MOBIC  Take 1 tablet (7.5 mg total) by mouth daily.     metroNIDAZOLE 500 MG tablet  Commonly known as:  FLAGYL  Take 1 tablet (500 mg total) by mouth 3 (three) times daily.     oxyCODONE-acetaminophen 5-325 MG per tablet  Commonly known as:  ROXICET  Take 1 tablet by mouth every 4 (four) hours as needed for severe pain.     PROAIR HFA 108 (90 BASE) MCG/ACT inhaler  Generic drug:  albuterol  Inhale 2 puffs into the lungs daily as needed.       Allergies  Allergen Reactions  . Sulfa Antibiotics Rash   Follow-up Information    Follow up with Primary care physician . Schedule an appointment as soon as possible for a visit in 1 week.   Why:  Hospital followup       The results of significant diagnostics from this hospitalization (including imaging, microbiology, ancillary and laboratory) are listed below for reference.    Significant Diagnostic Studies: Ct Abdomen Pelvis W Contrast  05/17/2014   CLINICAL DATA:  Acute onset of periumbilical and right lower quadrant abdominal pain and tenderness, with nausea. Initial encounter.  EXAM: CT ABDOMEN AND PELVIS WITH CONTRAST  TECHNIQUE: Multidetector CT imaging of the abdomen and pelvis was performed using the standard protocol following bolus administration of intravenous contrast.  CONTRAST:  100mL OMNIPAQUE IOHEXOL 300 MG/ML  SOLN  COMPARISON:  CT of the abdomen performed 04/17/2005; the pelvic CT is not available for comparison. Pelvic ultrasound from 04/19/2003  FINDINGS: The visualized lung bases are clear. Mild coronary artery calcification is noted.  The liver and spleen are unremarkable in appearance. The gallbladder is within normal limits. The pancreas and adrenal glands are unremarkable.  The kidneys are unremarkable in appearance. There is no evidence of hydronephrosis. No renal or ureteral stones are seen. No perinephric  stranding is appreciated.  The small bowel is unremarkable in appearance. The stomach is within normal limits. No acute vascular abnormalities are seen.  The appendix is normal in caliber, without evidence for appendicitis. Contrast progresses to the level of the proximal transverse colon. A few diverticula are noted along the mid sigmoid colon.  Note is made of focal soft tissue inflammation at the mid sigmoid colon, with mild associated wall thickening, and question of an inflamed diverticulum. This is suspicious for mild acute diverticulitis. Trace associated free fluid is seen, with soft tissue inflammation extending about the adjacent right ovary and uterus. There is no evidence of perforation or abscess formation at this time.  The bladder is mildly distended and grossly unremarkable. The uterus is otherwise unremarkable in appearance. The ovaries are relatively symmetric, aside from mild soft tissue inflammation and fluid about the right ovary. No inguinal lymphadenopathy is seen.  No acute osseous abnormalities are identified. 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  IMPRESSION: 1. Mild acute diverticulitis at  the mid sigmoid colon, with focal soft tissue inflammation and mild colonic wall thickening. Trace associated free fluid. Soft tissue inflammation extends about the adjacent right ovary and uterus. No evidence of perforation or abscess formation at this time. 2. Few diverticula noted along the mid sigmoid colon. 3. Mild coronary artery calcification noted. 4. Chronic bilateral pars defects at L5, with associated grade 1 anterolisthesis of L5 on S1, and underlying mild degenerative change.   Electronically Signed   By: Roanna Raider M.D.   On: 05/17/2014 22:32    Microbiology: No results found for this or any previous visit (from the past 240 hour(s)).   Labs: Basic Metabolic Panel:  Recent  Labs Lab 05/17/14 2028 05/18/14 0421 05/19/14 0556 05/20/14 0856  NA 135* 139 133* 136*  K 3.9 4.3 4.2 3.9  CL 93* 99 96 98  CO2 27 28 25 25   GLUCOSE 147* 154* 109* 106*  BUN 12 11 7 8   CREATININE 0.75 0.75 0.63 0.62  CALCIUM 9.7 8.7 8.8 8.4   Liver Function Tests:  Recent Labs Lab 05/17/14 2028 05/18/14 0421  AST 14 13  ALT 18 15  ALKPHOS 70 65  BILITOT 0.7 0.7  PROT 7.8 6.8  ALBUMIN 3.9 3.3*    Recent Labs Lab 05/17/14 2028  LIPASE 21   No results for input(s): AMMONIA in the last 168 hours. CBC:  Recent Labs Lab 05/17/14 2028 05/18/14 0421 05/19/14 0556 05/20/14 0856  WBC 16.0* 13.5* 10.5 7.1  NEUTROABS 13.3* 10.6*  --   --   HGB 14.1 12.9 11.1* 11.2*  HCT 41.8 38.3 34.0* 32.5*  MCV 89.9 91.8 90.9 91.3  PLT 301 229 217 225   Cardiac Enzymes: No results for input(s): CKTOTAL, CKMB, CKMBINDEX, TROPONINI in the last 168 hours. BNP: BNP (last 3 results) No results for input(s): PROBNP in the last 8760 hours. CBG:  Recent Labs Lab 05/20/14 0623 05/20/14 1139 05/20/14 1702 05/21/14 0013 05/21/14 0622  GLUCAP 81 92 123* 119* 124*       Signed:  Edsel Petrin  [facility] 05/21/2014, 9:37 AM

## 2014-05-26 LAB — CULTURE, BLOOD (ROUTINE X 2)
Culture: NO GROWTH
Culture: NO GROWTH

## 2014-12-10 ENCOUNTER — Other Ambulatory Visit: Payer: Self-pay | Admitting: Gastroenterology

## 2016-09-29 ENCOUNTER — Other Ambulatory Visit (HOSPITAL_COMMUNITY)
Admission: RE | Admit: 2016-09-29 | Discharge: 2016-09-29 | Disposition: A | Discharge status: Home / Self Care | Payer: BLUE CROSS/BLUE SHIELD | Source: Ambulatory Visit | Attending: Family Medicine | Admitting: Family Medicine

## 2016-09-29 ENCOUNTER — Other Ambulatory Visit: Payer: Self-pay | Admitting: Family Medicine

## 2016-09-29 DIAGNOSIS — Z01419 Encounter for gynecological examination (general) (routine) without abnormal findings: Secondary | ICD-10-CM | POA: Insufficient documentation

## 2016-09-29 DIAGNOSIS — Z1151 Encounter for screening for human papillomavirus (HPV): Secondary | ICD-10-CM | POA: Insufficient documentation

## 2016-09-30 LAB — CYTOLOGY - PAP
ADEQUACY: ABSENT
DIAGNOSIS: NEGATIVE
HPV: NOT DETECTED

## 2019-12-05 ENCOUNTER — Encounter (HOSPITAL_BASED_OUTPATIENT_CLINIC_OR_DEPARTMENT_OTHER): Payer: Self-pay | Admitting: *Deleted

## 2019-12-05 ENCOUNTER — Other Ambulatory Visit: Payer: Self-pay

## 2019-12-05 ENCOUNTER — Encounter (HOSPITAL_COMMUNITY)
Admission: AD | Disposition: A | Discharge status: Home / Self Care | Payer: Self-pay | Source: Home / Self Care | Attending: Cardiovascular Disease

## 2019-12-05 ENCOUNTER — Emergency Department (HOSPITAL_BASED_OUTPATIENT_CLINIC_OR_DEPARTMENT_OTHER): Payer: Self-pay

## 2019-12-05 ENCOUNTER — Inpatient Hospital Stay (HOSPITAL_BASED_OUTPATIENT_CLINIC_OR_DEPARTMENT_OTHER)
Admission: AD | Admit: 2019-12-05 | Discharge: 2019-12-07 | DRG: 246 | Disposition: A | Discharge status: Home / Self Care | Payer: Self-pay | Attending: Internal Medicine | Admitting: Internal Medicine

## 2019-12-05 DIAGNOSIS — Z8249 Family history of ischemic heart disease and other diseases of the circulatory system: Secondary | ICD-10-CM

## 2019-12-05 DIAGNOSIS — I5021 Acute systolic (congestive) heart failure: Secondary | ICD-10-CM | POA: Diagnosis present

## 2019-12-05 DIAGNOSIS — I200 Unstable angina: Secondary | ICD-10-CM

## 2019-12-05 DIAGNOSIS — Z20822 Contact with and (suspected) exposure to covid-19: Secondary | ICD-10-CM | POA: Diagnosis present

## 2019-12-05 DIAGNOSIS — Z91120 Patient's intentional underdosing of medication regimen due to financial hardship: Secondary | ICD-10-CM

## 2019-12-05 DIAGNOSIS — E785 Hyperlipidemia, unspecified: Secondary | ICD-10-CM | POA: Diagnosis present

## 2019-12-05 DIAGNOSIS — I255 Ischemic cardiomyopathy: Secondary | ICD-10-CM | POA: Diagnosis not present

## 2019-12-05 DIAGNOSIS — I110 Hypertensive heart disease with heart failure: Secondary | ICD-10-CM | POA: Diagnosis present

## 2019-12-05 DIAGNOSIS — Z79899 Other long term (current) drug therapy: Secondary | ICD-10-CM

## 2019-12-05 DIAGNOSIS — E119 Type 2 diabetes mellitus without complications: Secondary | ICD-10-CM | POA: Diagnosis present

## 2019-12-05 DIAGNOSIS — I214 Non-ST elevation (NSTEMI) myocardial infarction: Secondary | ICD-10-CM

## 2019-12-05 DIAGNOSIS — Z560 Unemployment, unspecified: Secondary | ICD-10-CM

## 2019-12-05 DIAGNOSIS — E1100 Type 2 diabetes mellitus with hyperosmolarity without nonketotic hyperglycemic-hyperosmolar coma (NKHHC): Secondary | ICD-10-CM

## 2019-12-05 DIAGNOSIS — Z791 Long term (current) use of non-steroidal anti-inflammatories (NSAID): Secondary | ICD-10-CM

## 2019-12-05 DIAGNOSIS — I25110 Atherosclerotic heart disease of native coronary artery with unstable angina pectoris: Secondary | ICD-10-CM | POA: Diagnosis present

## 2019-12-05 DIAGNOSIS — Z955 Presence of coronary angioplasty implant and graft: Secondary | ICD-10-CM

## 2019-12-05 DIAGNOSIS — Z23 Encounter for immunization: Secondary | ICD-10-CM

## 2019-12-05 DIAGNOSIS — Z6841 Body Mass Index (BMI) 40.0 and over, adult: Secondary | ICD-10-CM

## 2019-12-05 DIAGNOSIS — I10 Essential (primary) hypertension: Secondary | ICD-10-CM | POA: Diagnosis present

## 2019-12-05 DIAGNOSIS — F329 Major depressive disorder, single episode, unspecified: Secondary | ICD-10-CM | POA: Diagnosis present

## 2019-12-05 HISTORY — PX: LEFT HEART CATH AND CORONARY ANGIOGRAPHY: CATH118249

## 2019-12-05 HISTORY — DX: Unstable angina: I20.0

## 2019-12-05 HISTORY — DX: Non-ST elevation (NSTEMI) myocardial infarction: I21.4

## 2019-12-05 HISTORY — DX: Atherosclerotic heart disease of native coronary artery without angina pectoris: I25.10

## 2019-12-05 HISTORY — DX: Hyperlipidemia, unspecified: E78.5

## 2019-12-05 HISTORY — PX: CORONARY STENT INTERVENTION: CATH118234

## 2019-12-05 HISTORY — DX: Essential (primary) hypertension: I10

## 2019-12-05 LAB — CBC WITH DIFFERENTIAL/PLATELET
Abs Immature Granulocytes: 0.05 10*3/uL (ref 0.00–0.07)
Basophils Absolute: 0 10*3/uL (ref 0.0–0.1)
Basophils Relative: 0 %
Eosinophils Absolute: 0.1 10*3/uL (ref 0.0–0.5)
Eosinophils Relative: 2 %
HCT: 43.6 % (ref 36.0–46.0)
Hemoglobin: 15.1 g/dL — ABNORMAL HIGH (ref 12.0–15.0)
Immature Granulocytes: 1 %
Lymphocytes Relative: 16 %
Lymphs Abs: 1.5 10*3/uL (ref 0.7–4.0)
MCH: 31.1 pg (ref 26.0–34.0)
MCHC: 34.6 g/dL (ref 30.0–36.0)
MCV: 89.9 fL (ref 80.0–100.0)
Monocytes Absolute: 0.5 10*3/uL (ref 0.1–1.0)
Monocytes Relative: 5 %
Neutro Abs: 7.1 10*3/uL (ref 1.7–7.7)
Neutrophils Relative %: 76 %
Platelets: 345 10*3/uL (ref 150–400)
RBC: 4.85 MIL/uL (ref 3.87–5.11)
RDW: 12 % (ref 11.5–15.5)
WBC: 9.3 10*3/uL (ref 4.0–10.5)
nRBC: 0 % (ref 0.0–0.2)

## 2019-12-05 LAB — COMPREHENSIVE METABOLIC PANEL
ALT: 26 U/L (ref 0–44)
AST: 25 U/L (ref 15–41)
Albumin: 3.7 g/dL (ref 3.5–5.0)
Alkaline Phosphatase: 54 U/L (ref 38–126)
Anion gap: 9 (ref 5–15)
BUN: 12 mg/dL (ref 8–23)
CO2: 26 mmol/L (ref 22–32)
Calcium: 9 mg/dL (ref 8.9–10.3)
Chloride: 100 mmol/L (ref 98–111)
Creatinine, Ser: 0.79 mg/dL (ref 0.44–1.00)
GFR calc Af Amer: >60 mL/min (ref >60)
GFR calc non Af Amer: >60 mL/min (ref >60)
Glucose, Bld: 254 mg/dL — ABNORMAL HIGH (ref 70–99)
Potassium: 3.9 mmol/L (ref 3.5–5.1)
Sodium: 135 mmol/L (ref 135–145)
Total Bilirubin: 0.5 mg/dL (ref 0.3–1.2)
Total Protein: 6.5 g/dL (ref 6.5–8.1)

## 2019-12-05 LAB — TROPONIN I (HIGH SENSITIVITY): Troponin I (High Sensitivity): 1182 ng/L (ref <18)

## 2019-12-05 LAB — SARS CORONAVIRUS 2 BY RT PCR (HOSPITAL ORDER, PERFORMED IN ~~LOC~~ HOSPITAL LAB): SARS Coronavirus 2: NEGATIVE

## 2019-12-05 SURGERY — CORONARY/GRAFT ACUTE MI REVASCULARIZATION
Anesthesia: LOCAL

## 2019-12-05 SURGERY — LEFT HEART CATH AND CORONARY ANGIOGRAPHY
Anesthesia: LOCAL

## 2019-12-05 MED ORDER — FENTANYL CITRATE (PF) 100 MCG/2ML IJ SOLN
INTRAMUSCULAR | Status: DC | PRN
  Administered 2019-12-05: 50 ug via INTRAVENOUS
  Administered 2019-12-05 (×2): 25 ug via INTRAVENOUS

## 2019-12-05 MED ORDER — FENTANYL CITRATE (PF) 100 MCG/2ML IJ SOLN
INTRAMUSCULAR | Status: AC
  Filled 2019-12-05: qty 2

## 2019-12-05 MED ORDER — HEPARIN (PORCINE) IN NACL 1000-0.9 UT/500ML-% IV SOLN
INTRAVENOUS | Status: DC | PRN
  Administered 2019-12-05 (×2): 500 mL

## 2019-12-05 MED ORDER — VERAPAMIL HCL 2.5 MG/ML IV SOLN
INTRAVENOUS | Status: DC | PRN
  Administered 2019-12-05: 10 mL via INTRA_ARTERIAL

## 2019-12-05 MED ORDER — LIDOCAINE HCL (PF) 1 % IJ SOLN
INTRAMUSCULAR | Status: AC
  Filled 2019-12-05: qty 30

## 2019-12-05 MED ORDER — FENTANYL CITRATE (PF) 100 MCG/2ML IJ SOLN
75.0000 ug | Freq: Once | INTRAMUSCULAR | Status: AC
  Administered 2019-12-05: 75 ug via INTRAVENOUS
  Filled 2019-12-05: qty 2

## 2019-12-05 MED ORDER — SODIUM CHLORIDE 0.9 % IV SOLN: INTRAVENOUS | Status: AC

## 2019-12-05 MED ORDER — ASPIRIN 81 MG PO CHEW
324.0000 mg | CHEWABLE_TABLET | Freq: Once | ORAL | Status: AC
  Administered 2019-12-05: 324 mg via ORAL
  Filled 2019-12-05: qty 4

## 2019-12-05 MED ORDER — VERAPAMIL HCL 2.5 MG/ML IV SOLN
INTRAVENOUS | Status: AC
  Filled 2019-12-05: qty 2

## 2019-12-05 MED ORDER — ESCITALOPRAM OXALATE 10 MG PO TABS: 10.0000 mg | ORAL_TABLET | Freq: Every day | ORAL | Status: DC

## 2019-12-05 MED ORDER — HEPARIN BOLUS VIA INFUSION
3800.0000 [IU] | Freq: Once | INTRAVENOUS | Status: AC
  Administered 2019-12-05: 3800 [IU] via INTRAVENOUS

## 2019-12-05 MED ORDER — ACETAMINOPHEN 325 MG PO TABS: 650.0000 mg | ORAL_TABLET | ORAL | Status: DC | PRN

## 2019-12-05 MED ORDER — SODIUM CHLORIDE 0.9% FLUSH: 3.0000 mL | INTRAVENOUS | Status: DC | PRN

## 2019-12-05 MED ORDER — MIDAZOLAM HCL 2 MG/2ML IJ SOLN
INTRAMUSCULAR | Status: AC
  Filled 2019-12-05: qty 2

## 2019-12-05 MED ORDER — NITROGLYCERIN IN D5W 200-5 MCG/ML-% IV SOLN: 0.0000 ug/min | INTRAVENOUS | Status: DC

## 2019-12-05 MED ORDER — SODIUM CHLORIDE 0.9 % WEIGHT BASED INFUSION: 1.0000 mL/kg/h | INTRAVENOUS | Status: DC

## 2019-12-05 MED ORDER — NITROGLYCERIN IN D5W 200-5 MCG/ML-% IV SOLN
0.0000 ug/min | INTRAVENOUS | Status: DC
  Administered 2019-12-05: 5 ug/min via INTRAVENOUS
  Filled 2019-12-05: qty 250

## 2019-12-05 MED ORDER — ONDANSETRON HCL 4 MG/2ML IJ SOLN: 4.0000 mg | Freq: Four times a day (QID) | INTRAMUSCULAR | Status: DC | PRN

## 2019-12-05 MED ORDER — SODIUM CHLORIDE 0.9% FLUSH
3.0000 mL | Freq: Two times a day (BID) | INTRAVENOUS | Status: DC
  Administered 2019-12-06 – 2019-12-07 (×3): 3 mL via INTRAVENOUS

## 2019-12-05 MED ORDER — HEPARIN SODIUM (PORCINE) 1000 UNIT/ML IJ SOLN
INTRAMUSCULAR | Status: DC | PRN
  Administered 2019-12-05 (×2): 5000 [IU] via INTRAVENOUS

## 2019-12-05 MED ORDER — HEPARIN (PORCINE) 25000 UT/250ML-% IV SOLN
750.0000 [IU]/h | INTRAVENOUS | Status: DC
  Administered 2019-12-05: 750 [IU]/h via INTRAVENOUS
  Filled 2019-12-05: qty 250

## 2019-12-05 MED ORDER — ONDANSETRON HCL 4 MG/2ML IJ SOLN
INTRAMUSCULAR | Status: AC
  Administered 2019-12-05: 4 mg via INTRAVENOUS
  Filled 2019-12-05: qty 2

## 2019-12-05 MED ORDER — TICAGRELOR 90 MG PO TABS
ORAL_TABLET | ORAL | Status: AC
  Filled 2019-12-05: qty 1

## 2019-12-05 MED ORDER — ACETAMINOPHEN 325 MG PO TABS
650.0000 mg | ORAL_TABLET | ORAL | Status: DC | PRN
  Administered 2019-12-05 – 2019-12-07 (×8): 650 mg via ORAL
  Filled 2019-12-05 (×8): qty 2

## 2019-12-05 MED ORDER — SODIUM CHLORIDE 0.9 % IV SOLN: 250.0000 mL | INTRAVENOUS | Status: DC | PRN

## 2019-12-05 MED ORDER — SODIUM CHLORIDE 0.9 % WEIGHT BASED INFUSION: 3.0000 mL/kg/h | INTRAVENOUS | Status: DC

## 2019-12-05 MED ORDER — TICAGRELOR 90 MG PO TABS
ORAL_TABLET | ORAL | Status: DC | PRN
  Administered 2019-12-05: 180 mg via ORAL

## 2019-12-05 MED ORDER — ONDANSETRON HCL 4 MG/2ML IJ SOLN: 4.0000 mg | Freq: Once | INTRAMUSCULAR | Status: AC

## 2019-12-05 MED ORDER — SODIUM CHLORIDE 0.9% FLUSH
3.0000 mL | Freq: Two times a day (BID) | INTRAVENOUS | Status: DC
Start: 2019-12-05 — End: 2019-12-05

## 2019-12-05 MED ORDER — ONDANSETRON HCL 4 MG/2ML IJ SOLN
4.0000 mg | Freq: Four times a day (QID) | INTRAMUSCULAR | Status: DC | PRN
  Administered 2019-12-06: 4 mg via INTRAVENOUS
  Filled 2019-12-05: qty 2

## 2019-12-05 MED ORDER — LIDOCAINE HCL (PF) 1 % IJ SOLN
INTRAMUSCULAR | Status: DC | PRN
  Administered 2019-12-05: 3 mL

## 2019-12-05 MED ORDER — ASPIRIN EC 81 MG PO TBEC: 81.0000 mg | DELAYED_RELEASE_TABLET | Freq: Every day | ORAL | Status: DC

## 2019-12-05 MED ORDER — IOHEXOL 350 MG/ML SOLN
INTRAVENOUS | Status: DC | PRN
  Administered 2019-12-05: 120 mL

## 2019-12-05 MED ORDER — FLUTICASONE PROPIONATE 50 MCG/ACT NA SUSP: 1.0000 | Freq: Every day | NASAL | Status: DC

## 2019-12-05 MED ORDER — ALBUTEROL SULFATE HFA 108 (90 BASE) MCG/ACT IN AERS: 2.0000 | INHALATION_SPRAY | Freq: Four times a day (QID) | RESPIRATORY_TRACT | Status: DC | PRN

## 2019-12-05 MED ORDER — TICAGRELOR 90 MG PO TABS
90.0000 mg | ORAL_TABLET | Freq: Two times a day (BID) | ORAL | Status: DC
  Administered 2019-12-06 – 2019-12-07 (×3): 90 mg via ORAL
  Filled 2019-12-05 (×3): qty 1

## 2019-12-05 MED ORDER — METOPROLOL TARTRATE 5 MG/5ML IV SOLN
5.0000 mg | Freq: Once | INTRAVENOUS | Status: AC
  Administered 2019-12-05: 5 mg via INTRAVENOUS
  Filled 2019-12-05: qty 5

## 2019-12-05 MED ORDER — ATORVASTATIN CALCIUM 80 MG PO TABS: 80.0000 mg | ORAL_TABLET | Freq: Every day | ORAL | Status: DC

## 2019-12-05 MED ORDER — LORATADINE 10 MG PO TABS: 10.0000 mg | ORAL_TABLET | Freq: Every day | ORAL | Status: DC

## 2019-12-05 MED ORDER — HEPARIN (PORCINE) IN NACL 1000-0.9 UT/500ML-% IV SOLN
INTRAVENOUS | Status: AC
  Filled 2019-12-05: qty 1000

## 2019-12-05 MED ORDER — LABETALOL HCL 5 MG/ML IV SOLN: 10.0000 mg | INTRAVENOUS | Status: AC | PRN

## 2019-12-05 MED ORDER — METOPROLOL TARTRATE 12.5 MG HALF TABLET
12.5000 mg | ORAL_TABLET | Freq: Two times a day (BID) | ORAL | Status: DC
Start: 2019-12-05 — End: 2019-12-05

## 2019-12-05 MED ORDER — IOHEXOL 350 MG/ML SOLN
INTRAVENOUS | Status: AC
  Filled 2019-12-05: qty 1

## 2019-12-05 MED ORDER — HYDRALAZINE HCL 20 MG/ML IJ SOLN: 10.0000 mg | INTRAMUSCULAR | Status: AC | PRN

## 2019-12-05 MED ORDER — FUROSEMIDE 10 MG/ML IJ SOLN
40.0000 mg | Freq: Once | INTRAMUSCULAR | Status: AC
  Administered 2019-12-05: 40 mg via INTRAVENOUS
  Filled 2019-12-05: qty 4

## 2019-12-05 MED ORDER — MIDAZOLAM HCL 2 MG/2ML IJ SOLN
INTRAMUSCULAR | Status: DC | PRN
  Administered 2019-12-05: 2 mg via INTRAVENOUS
  Administered 2019-12-05: 1 mg via INTRAVENOUS

## 2019-12-05 SURGICAL SUPPLY — 20 items
BALLN SAPPHIRE 2.0X12 (BALLOONS) ×2
BALLN SAPPHIRE ~~LOC~~ 3.75X12 (BALLOONS) ×2 IMPLANT
BALLOON SAPPHIRE 2.0X12 (BALLOONS) ×1 IMPLANT
CATH 5FR JL3.5 JR4 ANG PIG MP (CATHETERS) ×2 IMPLANT
CATH VISTA GUIDE 6FR XBLAD3.5 (CATHETERS) ×2 IMPLANT
DEVICE RAD COMP TR BAND LRG (VASCULAR PRODUCTS) ×2 IMPLANT
ELECT DEFIB PAD ADLT CADENCE (PAD) ×2 IMPLANT
GLIDESHEATH SLEND SS 6F .021 (SHEATH) ×2 IMPLANT
GUIDEWIRE INQWIRE 1.5J.035X260 (WIRE) ×1 IMPLANT
INQWIRE 1.5J .035X260CM (WIRE) ×2
KIT ENCORE 26 ADVANTAGE (KITS) ×2 IMPLANT
KIT HEART LEFT (KITS) ×2 IMPLANT
PACK CARDIAC CATHETERIZATION (CUSTOM PROCEDURE TRAY) ×2 IMPLANT
SHEATH PROBE COVER 6X72 (BAG) ×2 IMPLANT
STENT SYNERGY XD 3.50X16 (Permanent Stent) ×1 IMPLANT
SYNERGY XD 3.50X16 (Permanent Stent) ×2 IMPLANT
TRANSDUCER W/STOPCOCK (MISCELLANEOUS) ×2 IMPLANT
TUBING CIL FLEX 10 FLL-RA (TUBING) ×2 IMPLANT
WIRE COUGAR XT STRL 190CM (WIRE) ×2 IMPLANT
WIRE COUGAR XT STRL 300CM (WIRE) ×2 IMPLANT

## 2019-12-05 NOTE — ED Provider Notes (Signed)
Nicole Barry transferred from [facility] for cardiology consult due to NSTEMI. See previous provider's note for full HPI.  In short, Nicole Barry is a 64 year old female who presents to the ED due to chest pressure that radiates into left shoulder associated with nausea and vomiting. Nicole Barry also notes to have heaviness in her left arm. Nicole Barry has a strong family history for ACS.   During my initial evaluation Nicole Barry still admits to pressure. Heparin is hung at bedside.  Physical Exam  BP (!) 159/87   Pulse 69   Temp 98.1 F (36.7 C)   Resp (!) 21   Ht 4\' 10"  (1.473 m)   Wt 90.7 kg   SpO2 95%   BMI 41.80 kg/m   Physical Exam Vitals and nursing note reviewed.  Constitutional:      General: She is not in acute distress.    Appearance: She is not toxic-appearing.  HENT:     Head: Normocephalic.  Eyes:     Pupils: Pupils are equal, round, and reactive to light.  Cardiovascular:     Rate and Rhythm: Normal rate and regular rhythm.     Pulses: Normal pulses.     Heart sounds: Normal heart sounds. No murmur heard.  No friction rub. No gallop.   Pulmonary:     Effort: Pulmonary effort is normal.     Breath sounds: Normal breath sounds.  Abdominal:     General: Abdomen is flat. There is no distension.     Palpations: Abdomen is soft.     Tenderness: There is no abdominal tenderness. There is no guarding or rebound.  Musculoskeletal:     Cervical back: Neck supple.     Comments: Able to move all 4 extremities without difficulty.   Skin:    General: Skin is warm and dry.  Neurological:     General: No focal deficit present.     Mental Status: She is alert.  Psychiatric:        Mood and Affect: Mood normal.        Behavior: Behavior normal.     ED Course/Procedures   Clinical Course as of Dec 05 1823  Tue Dec 05, 2019  1825 Troponin I (High Sensitivity)(!!): 1,182 [CA]    Clinical Course User Index [CA] [name], PA-C   Results for orders placed or  performed during the hospital encounter of 12/05/19 (from the past 24 hour(s))  CBC with Differential     Status: Abnormal   Collection Time: 12/05/19  3:50 PM  Result Value Ref Range   WBC 9.3 4.0 - 10.5 K/uL   RBC 4.85 3.87 - 5.11 MIL/uL   Hemoglobin 15.1 (H) 12.0 - 15.0 g/dL   HCT 43.6 36 - 46 %   MCV 89.9 80.0 - 100.0 fL   MCH 31.1 26.0 - 34.0 pg   MCHC 34.6 30.0 - 36.0 g/dL   RDW 12.0 11.5 - 15.5 %   Platelets 345 150 - 400 K/uL   nRBC 0.0 0.0 - 0.2 %   Neutrophils Relative % 76 %   Neutro Abs 7.1 1.7 - 7.7 K/uL   Lymphocytes Relative 16 %   Lymphs Abs 1.5 0.7 - 4.0 K/uL   Monocytes Relative 5 %   Monocytes Absolute 0.5 0 - 1 K/uL   Eosinophils Relative 2 %   Eosinophils Absolute 0.1 0 - 0 K/uL   Basophils Relative 0 %   Basophils Absolute 0.0 0 - 0 K/uL  Immature Granulocytes 1 %   Abs Immature Granulocytes 0.05 0.00 - 0.07 K/uL  Comprehensive metabolic panel     Status: Abnormal   Collection Time: 12/05/19  3:50 PM  Result Value Ref Range   Sodium 135 135 - 145 mmol/L   Potassium 3.9 3.5 - 5.1 mmol/L   Chloride 100 98 - 111 mmol/L   CO2 26 22 - 32 mmol/L   Glucose, Bld 254 (H) 70 - 99 mg/dL   BUN 12 8 - 23 mg/dL   Creatinine, Ser 5.91 0.44 - 1.00 mg/dL   Calcium 9.0 8.9 - 63.8 mg/dL   Total Protein 6.5 6.5 - 8.1 g/dL   Albumin 3.7 3.5 - 5.0 g/dL   AST 25 15 - 41 U/L   ALT 26 0 - 44 U/L   Alkaline Phosphatase 54 38 - 126 U/L   Total Bilirubin 0.5 0.3 - 1.2 mg/dL   GFR calc non Af Amer >60 >60 mL/min   GFR calc Af Amer >60 >60 mL/min   Anion gap 9 5 - 15  Troponin I (High Sensitivity)     Status: Abnormal   Collection Time: 12/05/19  3:50 PM  Result Value Ref Range   Troponin I (High Sensitivity) 1,182 (HH) <18 ng/L  SARS Coronavirus 2 by RT PCR (hospital order, performed in [facility] hospital lab) Nasopharyngeal Nasopharyngeal Swab     Status: None   Collection Time: 12/05/19  4:07 PM   Specimen: Nasopharyngeal Swab  Result Value Ref Range   SARS  Coronavirus 2 NEGATIVE NEGATIVE    Procedures  MDM  Nicole Barry transferred from [facility] due to NSTEMI. Nicole Barry still admits to chest pressure. Nicole Barry currently receiving heparin. Nicole Barry in no acute distress. Nicole Barry already given Lopressor and ASA. Spoke to Dr. Rennis Golden with cardiology to inform him that Nicole Barry is here who agrees to evaluate Nicole Barry.      Mannie Stabile, PA-C 12/05/19 1842    Tegeler, Canary Brim, MD 12/05/19 2350

## 2019-12-05 NOTE — Progress Notes (Signed)
This patient was waiting to go to cath lab and she seemed anxious as she was waiting. I offered prayer and she was very receptive. Phebe Colla, Chaplain   12/05/19 2000  Clinical Encounter Type  Visited With Patient  Visit Type Initial;Spiritual support;Other (Comment) (going to Cath lab)  Referral From Care management (page)  Consult/Referral To Chaplain (chaplain)  Spiritual Encounters  Spiritual Needs Prayer  Stress Factors  Patient Stress Factors Health changes;Other (Comment) ( to go to cath lab)

## 2019-12-05 NOTE — ED Triage Notes (Addendum)
Pt c/o mid sternal cp with nausea/ SOB and bil jaw pain  Which radaites down left arm and Diaphoretic x 8 hrs

## 2019-12-05 NOTE — ED Notes (Signed)
Troponin level 1182. Dr. Juleen China notified and Primary RN notified.

## 2019-12-05 NOTE — ED Provider Notes (Signed)
[place] EMERGENCY DEPARTMENT Provider Note   CSN: [id number] Arrival date & time: 12/05/19  1531     History Chief Complaint  Patient presents with  . Chest Pain    Nicole Barry is a 64 y.o. female.  HPI  64 year old female with chest pain.  Patient woke up this morning with the pain in her bilateral jaw/teeth.  She felt like symptoms may be related to her sinuses and tried taking some Claritin and ibuprofen without improvement.  Throughout the day she began experience some chest discomfort which she describes as feeling of indigestion.  Around 2:00 she became nauseated and vomited.  Additionally, she has had heaviness in her left arm.  Symptoms seem to be worse with exertion.  No personal cardiac history.  She reports a strong family history though with her brother having a heart attack in his 70s and her father having a heart attack in his 43s.  Past Medical History:  Diagnosis Date  . Anxiety   . Depression   . Hypertension     Patient Active Problem List   Diagnosis Date Noted  . Sepsis ([place]) 05/19/2014  . Normocytic anemia 05/19/2014  . Depression with anxiety 05/19/2014  . Acute diverticulitis 05/17/2014  . Hypertension 05/17/2014  . Diverticulitis 05/17/2014    Past Surgical History:  Procedure Laterality Date  . BREAST SURGERY    . CESAREAN SECTION    . rotator cull surg     lt shoulder     OB History   No obstetric history on file.     Family History  Problem Relation Age of Onset  . Colon cancer Neg Hx     Social History   Tobacco Use  . Smoking status: Never Smoker  . Smokeless tobacco: Never Used  Substance Use Topics  . Alcohol use: Yes    Comment: occasional  . Drug use: No    Home Medications Prior to Admission medications   Medication Sig Start Date End Date Taking? Authorizing Provider  ALPRAZolam Duanne Moron) 0.5 MG tablet Take 0.5 mg by mouth 2 (two) times daily as needed. 05/13/14   [provider]  ciprofloxacin  (CIPRO) 500 MG tablet Take 1 tablet (500 mg total) by mouth 2 (two) times daily. 05/21/14   Mikhail, Velta Addison, DO  escitalopram (LEXAPRO) 20 MG tablet Take 20 mg by mouth daily.    [provider]  fluconazole (DIFLUCAN) 150 MG tablet Take 1 tablet (150 mg total) by mouth daily. 05/21/14   Cristal Ford, DO  HYDROcodone-acetaminophen (NORCO/VICODIN) 5-325 MG per tablet Take 1 tablet by mouth every 6 (six) hours as needed for moderate pain. 05/21/14   Mikhail, Velta Addison, DO  lisinopril-hydrochlorothiazide (PRINZIDE,ZESTORETIC) 10-12.5 MG per tablet Take 1 tablet by mouth daily. 05/13/14   [provider]  meloxicam (MOBIC) 7.5 MG tablet Take 1 tablet (7.5 mg total) by mouth daily. 07/08/13   Ashley Murrain, NP  metroNIDAZOLE (FLAGYL) 500 MG tablet Take 1 tablet (500 mg total) by mouth 3 (three) times daily. 05/21/14   Mikhail, Velta Addison, DO  oxyCODONE-acetaminophen (ROXICET) 5-325 MG per tablet Take 1 tablet by mouth every 4 (four) hours as needed for severe pain. 07/08/13   Ashley Murrain, NP  PROAIR HFA 108 (90 BASE) MCG/ACT inhaler Inhale 2 puffs into the lungs daily as needed. 05/13/14   [provider]    Allergies    Sulfa antibiotics  Review of Systems   Review of Systems All systems reviewed and negative, other  than as noted in HPI.  Physical Exam Updated Vital Signs BP (!) 148/86   Pulse 78   Temp 98.1 F (36.7 C)   Resp 18   Ht 4\' 10"  (1.473 m)   Wt 90.7 kg   SpO2 100%   BMI 41.80 kg/m   Physical Exam Vitals and nursing note reviewed.  Constitutional:      General: She is not in acute distress.    Appearance: She is well-developed. She is obese.  HENT:     Head: Normocephalic and atraumatic.  Eyes:     General:        Right eye: No discharge.        Left eye: No discharge.     Conjunctiva/sclera: Conjunctivae normal.  Cardiovascular:     Rate and Rhythm: Normal rate and regular rhythm.     Heart sounds: Normal heart sounds. No murmur heard.  No  friction rub. No gallop.   Pulmonary:     Effort: Pulmonary effort is normal. No respiratory distress.     Breath sounds: Normal breath sounds.  Abdominal:     General: There is no distension.     Palpations: Abdomen is soft.     Tenderness: There is no abdominal tenderness.  Musculoskeletal:        General: No tenderness.     Cervical back: Neck supple.  Skin:    General: Skin is warm and dry.  Neurological:     Mental Status: She is alert.  Psychiatric:        Behavior: Behavior normal.        Thought Content: Thought content normal.     ED Results / Procedures / Treatments   Labs (all labs ordered are listed, but only abnormal results are displayed) Labs Reviewed  CBC WITH DIFFERENTIAL/PLATELET - Abnormal; Notable for the following components:      Result Value   Hemoglobin 15.1 (*)    All other components within normal limits  COMPREHENSIVE METABOLIC PANEL - Abnormal; Notable for the following components:   Glucose, Bld 254 (*)    All other components within normal limits  TROPONIN I (HIGH SENSITIVITY) - Abnormal; Notable for the following components:   Troponin I (High Sensitivity) 1,182 (*)    All other components within normal limits  SARS CORONAVIRUS 2 BY RT PCR (HOSPITAL ORDER, PERFORMED IN [facility])  HEPARIN LEVEL (UNFRACTIONATED)    EKG EKG Interpretation  Date/Time:  Tuesday December 05 2019 15:34:58 EDT Ventricular Rate:  81 PR Interval:  148 QRS Duration: 82 QT Interval:  370 QTC Calculation: 429 R Axis:   14 Text Interpretation: Normal sinus rhythm Septal infarct , age undetermined Abnormal ECG No old tracing to compare Confirmed by [id number] [phone]) on 12/05/2019 3:44:14 PM   Radiology DG Chest 2 View  Result Date: 12/05/2019 CLINICAL DATA:  Chest pain EXAM: CHEST - 2 VIEW COMPARISON:  10/21/2007 FINDINGS: The heart size and mediastinal contours are within normal limits. Both lungs are clear. The visualized skeletal structures are  unremarkable. IMPRESSION: No active cardiopulmonary disease. Electronically Signed   By: 12/21/2007 M.D.   On: 12/05/2019 16:55    Procedures Procedures (including critical care time)  CRITICAL CARE Performed by: 12/07/2019 Total critical care time: 35 minutes Critical care time was exclusive of separately billable procedures and treating other patients. Critical care was necessary to treat or prevent imminent or life-threatening deterioration. Critical care was time spent personally by me on the following  activities: development of treatment plan with patient and/or surrogate as well as nursing, discussions with consultants, evaluation of patient's response to treatment, examination of patient, obtaining history from patient or surrogate, ordering and performing treatments and interventions, ordering and review of laboratory studies, ordering and review of radiographic studies, pulse oximetry and re-evaluation of patient's condition.   Medications Ordered in ED Medications  aspirin chewable tablet 324 mg (has no administration in time range)    ED Course  I have reviewed the triage vital signs and the nursing notes.  Pertinent labs & imaging results that were available during my care of the patient were reviewed by me and considered in my medical decision making (see chart for details).    MDM Rules/Calculators/A&P                         64 year old female with chest pain.  Some features concerning for ACS.  Strong family history.  Concern for possible unstable angina. EKG abnormal but non-diagnostic of STEMI. ~64mm of STE in V2 only. No prior for comparison. Plan aspirin and heparin.  Even if initial work-up is unremarkable, she is high risk and will need admitted.  Troponin is significantly elevated. ASA and heparin previously ordered. Symptoms remain about the same since initial evaluation. (mild chest discomfort and L arm heaviness). She is now diaphoretic. Will repeat EKG  and give a dose of metoprolol. Cardiology consult pending.   Discussed with Dr Rennis Golden, cardiology. She remains symptomatic. Will transfer [facility] ED [facility] for [facility] evaluation. Probably cath lab if still having symptoms. Pt updated. Discussed with Dr Rhunette Croft, EDP.   Final Clinical Impression(s) / ED Diagnoses Final diagnoses:  NSTEMI (non-ST elevated myocardial infarction) [facility])    Rx / DC Orders ED Discharge Orders    None       Raeford Razor, MD 12/05/19 1702

## 2019-12-05 NOTE — Progress Notes (Signed)
Updated H and P for procedure:   See note by Bettina Gavia, PA-C  Plan for left heart cath possible PCI for NSTEMI.   Verne Carrow 12/05/2019 7:28 PM

## 2019-12-05 NOTE — H&P (Signed)
Cardiology Admission History and Physical:   Patient ID: Nicole Barry MRN: [id number]; DOB: 25-Jan-1956   Admission date: 12/05/2019  Primary Care Provider: System, Provider Not In [facility] Cardiologist: Chrystie Nose, MD  [facility] Electrophysiologist:  None   Chief Complaint:    Patient Profile:   Nicole Barry is a 64 y.o. female with history of HTN, pre-diabetic, and depression presented to [facility] with chest pain.  History of Present Illness:   Nicole Barry does not currently follow with cardiology. She presented to [facility] today after waking up with pain in her jaw/teeth. She took claritin and NSAID without relief. She reports chest discomfort developed throughout the day ans symptoms worsened with exertion. AT 2pm she was nauseous and vomited. EKG with anterior Q waves, mild ST elevation in anterior leads, no prior for comparison. HS troponin 1182.   Cardiology was consulted and accepted her in transfer. She was seen in [facility]. She reports ongoing left-sided chest pain radiating to her left shoulder and back. Heparin drip running. She has not received nitro.   She does have a strong family history of CAD. Brother had MI in his 30s. Father had MI in this 18s. She is a never smoker. She has lost her job and insurance, since then has stopped taking BP medication. She has not taking lisinopril, HCTZ, or metformin in over a year. She has intermittent bronchitis and was vaccinated against COVID in Apirl 2021.   Past Medical History:  Diagnosis Date  . Anxiety   . Depression   . Hypertension     Past Surgical History:  Procedure Laterality Date  . BREAST SURGERY    . CESAREAN SECTION    . rotator cull surg     lt shoulder     Medications Prior to Admission: Prior to Admission medications   Medication Sig Start Date End Date Taking? Authorizing Provider  albuterol (VENTOLIN HFA) 108 (90 Base) MCG/ACT inhaler Inhale 2 puffs into the lungs every 6 (six) hours as needed for wheezing or  shortness of breath.   Yes [provider]  escitalopram (LEXAPRO) 10 MG tablet Take 10 mg by mouth daily.   Yes [provider]  fluticasone (FLONASE) 50 MCG/ACT nasal spray Place 1 spray into both nostrils daily.   Yes [provider]  loratadine (CLARITIN) 10 MG tablet Take 10 mg by mouth daily.   Yes [provider]  ALPRAZolam Prudy Feeler) 0.5 MG tablet Take 0.5 mg by mouth 2 (two) times daily as needed. 05/13/14   [provider]  ciprofloxacin (CIPRO) 500 MG tablet Take 1 tablet (500 mg total) by mouth 2 (two) times daily. 05/21/14   Mikhail, Nita Sells, DO  fluconazole (DIFLUCAN) 150 MG tablet Take 1 tablet (150 mg total) by mouth daily. 05/21/14   Edsel Petrin, DO  HYDROcodone-acetaminophen (NORCO/VICODIN) 5-325 MG per tablet Take 1 tablet by mouth every 6 (six) hours as needed for moderate pain. 05/21/14   Mikhail, Nita Sells, DO  lisinopril-hydrochlorothiazide (PRINZIDE,ZESTORETIC) 10-12.5 MG per tablet Take 1 tablet by mouth daily. 05/13/14   [provider]  meloxicam (MOBIC) 7.5 MG tablet Take 1 tablet (7.5 mg total) by mouth daily. 07/08/13   Janne Napoleon, NP  metroNIDAZOLE (FLAGYL) 500 MG tablet Take 1 tablet (500 mg total) by mouth 3 (three) times daily. 05/21/14   Mikhail, Nita Sells, DO  oxyCODONE-acetaminophen (ROXICET) 5-325 MG per tablet Take 1 tablet by mouth every 4 (four) hours as needed for severe pain. 07/08/13   Damian Leavell,  Hope M, NP  PROAIR HFA 108 (90 BASE) MCG/ACT inhaler Inhale 2 puffs into the lungs daily as needed. 05/13/14   [provider]     Allergies:    Allergies  Allergen Reactions  . Sulfa Antibiotics Rash    Social History:   Social History   Socioeconomic History  . Marital status: Legally Separated    Spouse name: Not on file  . Number of children: Not on file  . Years of education: Not on file  . Highest education level: Not on file  Occupational History  . Not on file  Tobacco Use  . Smoking  status: Never Smoker  . Smokeless tobacco: Never Used  Substance and Sexual Activity  . Alcohol use: Yes    Comment: occasional  . Drug use: No  . Sexual activity: Not on file  Other Topics Concern  . Not on file  Social History Narrative  . Not on file   Social Determinants of Health   Financial Resource Strain:   . Difficulty of Paying Living Expenses:   Food Insecurity:   . Worried About Charity fundraiser in the Last Year:   . Arboriculturist in the Last Year:   Transportation Needs:   . Film/video editor (Medical):   Marland Kitchen Lack of Transportation (Non-Medical):   Physical Activity:   . Days of Exercise per Week:   . Minutes of Exercise per Session:   Stress:   . Feeling of Stress :   Social Connections:   . Frequency of Communication with Friends and Family:   . Frequency of Social Gatherings with Friends and Family:   . Attends Religious Services:   . Active Member of Clubs or Organizations:   . Attends Archivist Meetings:   Marland Kitchen Marital Status:   Intimate Partner Violence:   . Fear of Current or Ex-Partner:   . Emotionally Abused:   Marland Kitchen Physically Abused:   . Sexually Abused:     Family History:   The patient's family history is negative for Colon cancer.    ROS:  Please see the history of present illness.  All other ROS reviewed and negative.     Physical Exam/Data:   Vitals:   12/05/19 1558 12/05/19 1631 12/05/19 1730 12/05/19 1732  BP:  (!) 150/77 (!) 174/90 (!) 159/87  Pulse: 78 73 72 69  Resp: 17 (!) 8 12 (!) 21  Temp:      SpO2: 99% 98% 97% 95%  Weight:      Height:       No intake or output data in the 24 hours ending 12/05/19 1824 Last 3 Weights 12/05/2019 05/18/2014 05/17/2014  Weight (lbs) 200 lb 192 lb 3.9 oz 180 lb  Weight (kg) 90.719 kg 87.2 kg 81.647 kg     Body mass index is 41.8 kg/m.  General:  Well nourished, well developed, in no acute distress HEENT: normal Lymph: no adenopathy Neck: no JVD Endocrine:  No  thryomegaly Vascular: No carotid bruits; FA pulses 2+ bilaterally without bruits  Cardiac:  normal S1, S2; RRR; no murmur  Lungs:  clear to auscultation bilaterally, no wheezing, rhonchi or rales  Abd: soft, nontender, no hepatomegaly  Ext: no edema Musculoskeletal:  No deformities, BUE and BLE strength normal and equal Skin: warm and dry  Neuro:  CNs 2-12 intact, no focal abnormalities noted Psych:  Normal affect    EKG:  The ECG that was done  was personally reviewed and  demonstrates sinus rhythm HR 70 with mild ST elevation in V2-4  Relevant CV Studies:  Pending   Laboratory Data:  High Sensitivity Troponin:   Recent Labs  Lab 12/05/19 1550  TROPONINIHS 1,182*      Chemistry Recent Labs  Lab 12/05/19 1550  NA 135  K 3.9  CL 100  CO2 26  GLUCOSE 254*  BUN 12  CREATININE 0.79  CALCIUM 9.0  GFRNONAA >60  GFRAA >60  ANIONGAP 9    Recent Labs  Lab 12/05/19 1550  PROT 6.5  ALBUMIN 3.7  AST 25  ALT 26  ALKPHOS 54  BILITOT 0.5   Hematology Recent Labs  Lab 12/05/19 1550  WBC 9.3  RBC 4.85  HGB 15.1*  HCT 43.6  MCV 89.9  MCH 31.1  MCHC 34.6  RDW 12.0  PLT 345   BNPNo results for input(s): BNP, PROBNP in the last 168 hours.  DDimer No results for input(s): DDIMER in the last 168 hours.   Radiology/Studies:  DG Chest 2 View  Result Date: 12/05/2019 CLINICAL DATA:  Chest pain EXAM: CHEST - 2 VIEW COMPARISON:  10/21/2007 FINDINGS: The heart size and mediastinal contours are within normal limits. Both lungs are clear. The visualized skeletal structures are unremarkable. IMPRESSION: No active cardiopulmonary disease. Electronically Signed   By: Marlan Palau M.D.   On: 12/05/2019 16:55       TIMI Risk Score for Unstable Angina or Non-ST Elevation MI:   The patient's TIMI risk score is  , which indicates a  % risk of all cause mortality, new or recurrent myocardial infarction or need for urgent revascularization in the next 14 days.       Assessment and Plan:   NSTEMI Chest pain - EKG suspicious for mild ST elevation anterior leads. Pt continues to have 6/10 left-sided chest pain radiating to her left shoulder and back.  - HS troponin 1182 - will trend CE - case discussed with cath lab and will be taken for heart cath this evening - continue heparin - I have started nitro drip - will start low dose lopressor, high intensity statin   Untreated hypertension Was previously on ACEI/HCTZ. Has not taken in over a year due to loss of insurance.  Pressure here is elevated, patient in significant pain.    Prediabetes A1c pending. Has not taken metformin in over a year.    Admit to cardiology.  Severity of Illness: The appropriate patient status for this patient is INPATIENT. Inpatient status is judged to be reasonable and necessary in order to provide the required intensity of service to ensure the patient's safety. The patient's presenting symptoms, physical exam findings, and initial radiographic and laboratory data in the context of their chronic comorbidities is felt to place them at high risk for further clinical deterioration. Furthermore, it is not anticipated that the patient will be medically stable for discharge from the hospital within 2 midnights of admission. The following factors support the patient status of inpatient.   " The patient's presenting symptoms include [place]. " The worrisome physical exam findings include NSTEMI. " The initial radiographic and laboratory data are worrisome because of unstable angina " The chronic co-morbidities include HTN.   * I certify that at the point of admission it is my clinical judgment that the patient will require inpatient hospital care spanning beyond 2 midnights from the point of admission due to high intensity of service, high risk for further deterioration and high frequency of surveillance required.*  For questions or updates, please contact [facility] Please consult [email or web address] for contact info under     Signed, Marcelino Duster, [place]  12/05/2019 6:24 PM

## 2019-12-05 NOTE — Progress Notes (Signed)
ANTICOAGULATION CONSULT NOTE - Initial Consult  Pharmacy Consult for heparin Indication: chest pain/ACS  Allergies  Allergen Reactions  . Sulfa Antibiotics Rash    Patient Measurements: Height: 4\' 10"  (147.3 cm) Weight: 90.7 kg (200 lb) IBW/kg (Calculated) : 40.9 Heparin Dosing Weight: 63  Vital Signs: Temp: 98.1 F (36.7 C) (06/22 1542) BP: 148/86 (06/22 1542) Pulse Rate: 78 (06/22 1558)  Labs: Recent Labs    12/05/19 1550  HGB 15.1*  HCT 43.6  PLT 345  CREATININE 0.79    Estimated Creatinine Clearance: 69.1 mL/min (by C-G formula based on SCr of 0.79 mg/dL).   Medical History: Past Medical History:  Diagnosis Date  . Anxiety   . Depression   . Hypertension     Medications:  Scheduled:  . heparin  3,800 Units Intravenous Once    Assessment:  New onset mid-sternal CP with nausea, SOB, jaw pain U diphoresis.   Goal of Therapy:  Heparin level 0.3-0.7 units/ml Monitor platelets by anticoagulation protocol: Yes   Plan:  Give 3800 units bolus x 1 Start heparin infusion at 750 units/hr Check anti-Xa level in 6 hours and daily while on heparin Continue to monitor H&H and platelets  12/07/19 A Bayani Renteria 12/05/2019,4:22 PM

## 2019-12-06 ENCOUNTER — Encounter (HOSPITAL_COMMUNITY): Payer: Self-pay | Admitting: Cardiovascular Disease

## 2019-12-06 ENCOUNTER — Inpatient Hospital Stay (HOSPITAL_COMMUNITY): Payer: Self-pay

## 2019-12-06 DIAGNOSIS — I214 Non-ST elevation (NSTEMI) myocardial infarction: Secondary | ICD-10-CM

## 2019-12-06 DIAGNOSIS — E119 Type 2 diabetes mellitus without complications: Secondary | ICD-10-CM

## 2019-12-06 DIAGNOSIS — E1100 Type 2 diabetes mellitus with hyperosmolarity without nonketotic hyperglycemic-hyperosmolar coma (NKHHC): Secondary | ICD-10-CM

## 2019-12-06 DIAGNOSIS — I5021 Acute systolic (congestive) heart failure: Secondary | ICD-10-CM

## 2019-12-06 LAB — BASIC METABOLIC PANEL
Anion gap: 10 (ref 5–15)
BUN: 7 mg/dL — ABNORMAL LOW (ref 8–23)
CO2: 28 mmol/L (ref 22–32)
Calcium: 8.8 mg/dL — ABNORMAL LOW (ref 8.9–10.3)
Chloride: 101 mmol/L (ref 98–111)
Creatinine, Ser: 0.77 mg/dL (ref 0.44–1.00)
GFR calc Af Amer: >60 mL/min (ref >60)
GFR calc non Af Amer: >60 mL/min (ref >60)
Glucose, Bld: 259 mg/dL — ABNORMAL HIGH (ref 70–99)
Potassium: 4.1 mmol/L (ref 3.5–5.1)
Sodium: 139 mmol/L (ref 135–145)

## 2019-12-06 LAB — ECHOCARDIOGRAM COMPLETE
Height: 58 in
Weight: 3184 oz

## 2019-12-06 LAB — CBC
HCT: 41.4 % (ref 36.0–46.0)
Hemoglobin: 14 g/dL (ref 12.0–15.0)
MCH: 30.2 pg (ref 26.0–34.0)
MCHC: 33.8 g/dL (ref 30.0–36.0)
MCV: 89.2 fL (ref 80.0–100.0)
Platelets: 298 10*3/uL (ref 150–400)
RBC: 4.64 MIL/uL (ref 3.87–5.11)
RDW: 12 % (ref 11.5–15.5)
WBC: 7.4 10*3/uL (ref 4.0–10.5)
nRBC: 0 % (ref 0.0–0.2)

## 2019-12-06 LAB — LIPID PANEL
Cholesterol: 267 mg/dL — ABNORMAL HIGH (ref 0–200)
HDL: 43 mg/dL (ref >40)
LDL Cholesterol: 160 mg/dL — ABNORMAL HIGH (ref 0–99)
Total CHOL/HDL Ratio: 6.2 RATIO
Triglycerides: 319 mg/dL — ABNORMAL HIGH (ref <150)
VLDL: 64 mg/dL — ABNORMAL HIGH (ref 0–40)

## 2019-12-06 LAB — GLUCOSE, CAPILLARY
Glucose-Capillary: 233 mg/dL — ABNORMAL HIGH (ref 70–99)
Glucose-Capillary: 175 mg/dL — ABNORMAL HIGH (ref 70–99)

## 2019-12-06 LAB — HEPARIN LEVEL (UNFRACTIONATED): Heparin Unfractionated: <0.1 IU/mL — ABNORMAL LOW (ref 0.30–0.70)

## 2019-12-06 LAB — POCT ACTIVATED CLOTTING TIME
Activated Clotting Time: 208 seconds
Activated Clotting Time: 329 seconds

## 2019-12-06 LAB — HEMOGLOBIN A1C
Hgb A1c MFr Bld: 8.4 % — ABNORMAL HIGH (ref 4.8–5.6)
Mean Plasma Glucose: 194.38 mg/dL

## 2019-12-06 LAB — TROPONIN I (HIGH SENSITIVITY): Troponin I (High Sensitivity): 7357 ng/L (ref <18)

## 2019-12-06 LAB — MAGNESIUM: Magnesium: 1.7 mg/dL (ref 1.7–2.4)

## 2019-12-06 MED ORDER — METFORMIN HCL 500 MG PO TABS
500.0000 mg | ORAL_TABLET | Freq: Two times a day (BID) | ORAL | Status: DC
  Administered 2019-12-07: 500 mg via ORAL
  Filled 2019-12-06: qty 1

## 2019-12-06 MED ORDER — ATORVASTATIN CALCIUM 80 MG PO TABS
80.0000 mg | ORAL_TABLET | Freq: Every day | ORAL | Status: DC
  Administered 2019-12-06 – 2019-12-07 (×2): 80 mg via ORAL
  Filled 2019-12-06 (×2): qty 1

## 2019-12-06 MED ORDER — ISOSORBIDE MONONITRATE ER 30 MG PO TB24
30.0000 mg | ORAL_TABLET | Freq: Every day | ORAL | Status: DC
  Administered 2019-12-06 – 2019-12-07 (×2): 30 mg via ORAL
  Filled 2019-12-06 (×2): qty 1

## 2019-12-06 MED ORDER — INSULIN ASPART 100 UNIT/ML ~~LOC~~ SOLN
0.0000 [IU] | Freq: Three times a day (TID) | SUBCUTANEOUS | Status: DC
  Administered 2019-12-06: 3 [IU] via SUBCUTANEOUS
  Administered 2019-12-06 – 2019-12-07 (×2): 5 [IU] via SUBCUTANEOUS

## 2019-12-06 MED ORDER — PERFLUTREN LIPID MICROSPHERE
1.0000 mL | INTRAVENOUS | Status: AC | PRN
  Administered 2019-12-06: 4 mL via INTRAVENOUS
  Filled 2019-12-06: qty 10

## 2019-12-06 MED ORDER — ESCITALOPRAM OXALATE 10 MG PO TABS
10.0000 mg | ORAL_TABLET | Freq: Every day | ORAL | Status: DC
  Administered 2019-12-06 – 2019-12-07 (×2): 10 mg via ORAL
  Filled 2019-12-06 (×2): qty 1

## 2019-12-06 MED ORDER — LIVING WELL WITH DIABETES BOOK
Freq: Once | Status: AC
  Filled 2019-12-06: qty 1

## 2019-12-06 MED ORDER — PNEUMOCOCCAL VAC POLYVALENT 25 MCG/0.5ML IJ INJ
0.5000 mL | INJECTION | INTRAMUSCULAR | Status: AC
  Administered 2019-12-07: 0.5 mL via INTRAMUSCULAR

## 2019-12-06 MED ORDER — ALUM & MAG HYDROXIDE-SIMETH 200-200-20 MG/5ML PO SUSP
30.0000 mL | Freq: Once | ORAL | Status: AC
  Administered 2019-12-06: 30 mL via ORAL
  Filled 2019-12-06: qty 30

## 2019-12-06 MED ORDER — ASPIRIN EC 81 MG PO TBEC
81.0000 mg | DELAYED_RELEASE_TABLET | Freq: Every day | ORAL | Status: DC
  Administered 2019-12-06 – 2019-12-07 (×2): 81 mg via ORAL
  Filled 2019-12-06 (×2): qty 1

## 2019-12-06 MED ORDER — LOSARTAN POTASSIUM 25 MG PO TABS
25.0000 mg | ORAL_TABLET | Freq: Every day | ORAL | Status: DC
  Administered 2019-12-06 – 2019-12-07 (×2): 25 mg via ORAL
  Filled 2019-12-06 (×2): qty 1

## 2019-12-06 MED ORDER — PANTOPRAZOLE SODIUM 40 MG PO TBEC
40.0000 mg | DELAYED_RELEASE_TABLET | Freq: Every day | ORAL | Status: DC
  Administered 2019-12-06 – 2019-12-07 (×2): 40 mg via ORAL
  Filled 2019-12-06 (×2): qty 1

## 2019-12-06 MED ORDER — FUROSEMIDE 20 MG PO TABS
20.0000 mg | ORAL_TABLET | Freq: Every day | ORAL | Status: DC
  Administered 2019-12-06 – 2019-12-07 (×2): 20 mg via ORAL
  Filled 2019-12-06 (×2): qty 1

## 2019-12-06 NOTE — Progress Notes (Signed)
Trop 7,654. Irene Limbo, PA made aware.

## 2019-12-06 NOTE — Progress Notes (Addendum)
Progress Note  Patient Name: Nicole Barry Date of Encounter: 12/06/2019  [facility] Cardiologist: Pixie Casino, MD   Subjective   No acute overnight events. Patient does not feel great this morning. She is still have back pressure which was her presenting symptoms. Also still has slight left jaw pain but improved from presentation. She describes indigestion but no real chest pain. She says she is burping a lot. Also nauseous this morning and was given Zofran. No shortness of breath.  Inpatient Medications    Scheduled Meds: . sodium chloride flush  3 mL Intravenous Q12H  . ticagrelor  90 mg Oral BID   Continuous Infusions: . sodium chloride    . nitroGLYCERIN Stopped (12/05/19 1938)   PRN Meds: sodium chloride, acetaminophen, ondansetron (ZOFRAN) IV, sodium chloride flush   Vital Signs    Vitals:   12/05/19 2328 12/05/19 2357 12/06/19 0028 12/06/19 0329  BP: 136/65 (!) 154/89 125/64 (!) 145/78  Pulse: 69  74 68  Resp:    16  Temp:   98.2 F (36.8 C) 98.5 F (36.9 C)  TempSrc:   Oral Oral  SpO2: 94%  99% 97%  Weight:    90.3 kg  Height:        Intake/Output Summary (Last 24 hours) at 12/06/2019 0741 Last data filed at 12/06/2019 0700 Gross per 24 hour  Intake 240.18 ml  Output 1600 ml  Net -1359.82 ml   Last 3 Weights 12/06/2019 12/05/2019 05/18/2014  Weight (lbs) 199 lb 200 lb 192 lb 3.9 oz  Weight (kg) 90.266 kg 90.719 kg 87.2 kg      Telemetry    Normal sinus rhythm baseline rates in the low 60's but will increase to 80's to 90's with activity. - Personally Reviewed  ECG    Normal sinus rhythm, rate 68, with T wave inversions in precordial leads (more prominent than yesterday) - Personally Reviewed  Physical Exam   GEN: No acute distress.   Neck: Suppe. No JVD Cardiac: RRR. Murmurs, rubs, or gallops. Radial pulses 2+ and equal bilaterally. Right radial cath site soft with no signs of hematoma. Respiratory: Clear to auscultation bilaterally. GI:  Soft, non-distended, and non-tender. MS: No lower extremity edema. No deformity. Skin: Warm and dry. Neuro:  No focal deficits. Psych: Normal affect.  Labs    High Sensitivity Troponin:   Recent Labs  Lab 12/05/19 1550  TROPONINIHS 1,182*      Chemistry Recent Labs  Lab 12/05/19 1550 12/06/19 0310  NA 135 139  K 3.9 4.1  CL 100 101  CO2 26 28  GLUCOSE 254* 259*  BUN 12 7*  CREATININE 0.79 0.77  CALCIUM 9.0 8.8*  PROT 6.5  --   ALBUMIN 3.7  --   AST 25  --   ALT 26  --   ALKPHOS 54  --   BILITOT 0.5  --   GFRNONAA >60 >60  GFRAA >60 >60  ANIONGAP 9 10     Hematology Recent Labs  Lab 12/05/19 1550 12/06/19 0310  WBC 9.3 7.4  RBC 4.85 4.64  HGB 15.1* 14.0  HCT 43.6 41.4  MCV 89.9 89.2  MCH 31.1 30.2  MCHC 34.6 33.8  RDW 12.0 12.0  PLT 345 298    BNPNo results for input(s): BNP, PROBNP in the last 168 hours.   DDimer No results for input(s): DDIMER in the last 168 hours.   Radiology    DG Chest 2 View  Result Date: 12/05/2019 CLINICAL  DATA:  Chest pain EXAM: CHEST - 2 VIEW COMPARISON:  10/21/2007 FINDINGS: The heart size and mediastinal contours are within normal limits. Both lungs are clear. The visualized skeletal structures are unremarkable. IMPRESSION: No active cardiopulmonary disease. Electronically Signed   By: Marlan Palau M.D.   On: 12/05/2019 16:55   CARDIAC CATHETERIZATION  Result Date: 12/05/2019  Prox LAD lesion is 99% stenosed.  1st Diag lesion is 65% stenosed.  Mid LAD lesion is 30% stenosed.  A drug-eluting stent was successfully placed using a SYNERGY XD 3.50X16.  Post intervention, there is a 0% residual stenosis.  1. Severe proximal LAD stenosis 2. Successful PTCA/DES x 1 proximal LAD 3. Moderate stenosis in the ostium of a moderate caliber diagonal branch. 4. Elevated LVEDP Recommendations: IV Lasix x 1. Echo in am. Continue ASA and Brilinta for one year. Continue statin and beta blocker. Medical management of Diagonal  stenosis.    Cardiac Studies   Left Heart Catheterization 12/05/2019:  Prox LAD lesion is 99% stenosed.  1st Diag lesion is 65% stenosed.  Mid LAD lesion is 30% stenosed.  A drug-eluting stent was successfully placed using a SYNERGY XD 3.50X16.  Post intervention, there is a 0% residual stenosis.   1. Severe proximal LAD stenosis 2. Successful PTCA/DES x 1 proximal LAD 3. Moderate stenosis in the ostium of a moderate caliber diagonal branch.  4. Elevated LVEDP  Recommendations: IV Lasix x 1. Echo in am. Continue ASA and Brilinta for one year. Continue statin and beta blocker. Medical management of Diagonal stenosis.   Diagnostic Dominance: Right  Intervention      Patient Profile     64 y.o. female with a history of hypertension, pre-diabetes, and depression who presented to the [facility] ED with chest pain on 12/05/2019 and was found to have a NSTEMI. Transferred to [facility] for cardiac catheterization.  Assessment & Plan    NSTEMI - High-sensitivity troponin 1,182. No repeat ordered. Will order repeat this morning given continued symptoms. - Repeat EKG today shows more prominent T wave inversion in precordial leads. - Left heart catheterization showed 99% stenosis of proximal LAD, 30% stenosis of mid LAD, 65% stenosis of 1st diagonal. Underwent successful PTCA/DES to proximal LAD lesion. LVEDP was elevated so one dose of IV Lasix was given last night. - Will check Echo this morning. - Patient continued to have back pressure and slight left sided jaw pain (presenting symptoms). She also notes indigestion but no real chest pain. - Will add Imdur 30mg  daily. - Will given GI cocktail and start Protonix 40mg  daily to see if that helps. - Continue DAPT with Aspirin and Brilinta. - Baseline heart rates in the low 60's at rest but looks like increase to 80's to 90's with activity. Will hold off on beta-blocker right now but may be able to add low dose. - Started  on high-intensity statin.  Hypertension - BP elevated. - Will add Losartan 25mg  daily given diabetes. - If back pain/neck pain (presenting symptoms) do not improve with GI cocktail and PPI, can consider adding Amlodipine as antianginal.  Hyperlipidemia - Lipid panel this admission: Total Cholesterol 267, Triglycerides 319, HDL 43, LDL 160. - LDL goal <70 given CAD.  - Started on Lipitor 80mg  daily. - Will need lipid panel/LFTs in 6-8 weeks.  Type 2 Diabetes Mellitus - New Diagnosis - Hemoglobin A1c 8.4.  - Will start on sliding scale insulin and will change to heart health/carb modified diet. - May be a good  SGLT2 inhibitor candidate. - Will need to follow-up with PCP.  Prolonged QTc - QTc of 506 ms. - Potassium 4.1.  - Will check Magnesium. - Patient is on Lexapro at home but QTc normal yesterday.  - Would not give any additional doses of Zofran.  - Avoid other QTc prolonging medications. - Repeat EKG in the morning.  For questions or updates, please contact [facility] Please consult [email or web address] for contact info under        Signed, Corrin Parker, PA-C  12/06/2019, 7:41 AM

## 2019-12-06 NOTE — Progress Notes (Signed)
  Echocardiogram 2D Echocardiogram with definity has been performed.  Leta Jungling M 12/06/2019, 9:29 AM

## 2019-12-07 ENCOUNTER — Encounter (HOSPITAL_COMMUNITY): Payer: Self-pay | Admitting: Cardiovascular Disease

## 2019-12-07 ENCOUNTER — Other Ambulatory Visit (HOSPITAL_COMMUNITY): Payer: Self-pay | Admitting: Physician Assistant

## 2019-12-07 DIAGNOSIS — E785 Hyperlipidemia, unspecified: Secondary | ICD-10-CM

## 2019-12-07 DIAGNOSIS — Z955 Presence of coronary angioplasty implant and graft: Secondary | ICD-10-CM

## 2019-12-07 LAB — CBC
HCT: 42.2 % (ref 36.0–46.0)
Hemoglobin: 14.3 g/dL (ref 12.0–15.0)
MCH: 30.6 pg (ref 26.0–34.0)
MCHC: 33.9 g/dL (ref 30.0–36.0)
MCV: 90.2 fL (ref 80.0–100.0)
Platelets: 313 10*3/uL (ref 150–400)
RBC: 4.68 MIL/uL (ref 3.87–5.11)
RDW: 11.9 % (ref 11.5–15.5)
WBC: 10.2 10*3/uL (ref 4.0–10.5)
nRBC: 0 % (ref 0.0–0.2)

## 2019-12-07 LAB — BASIC METABOLIC PANEL
Anion gap: 10 (ref 5–15)
BUN: 7 mg/dL — ABNORMAL LOW (ref 8–23)
CO2: 29 mmol/L (ref 22–32)
Calcium: 8.9 mg/dL (ref 8.9–10.3)
Chloride: 100 mmol/L (ref 98–111)
Creatinine, Ser: 0.79 mg/dL (ref 0.44–1.00)
GFR calc Af Amer: >60 mL/min (ref >60)
GFR calc non Af Amer: >60 mL/min (ref >60)
Glucose, Bld: 214 mg/dL — ABNORMAL HIGH (ref 70–99)
Potassium: 4.5 mmol/L (ref 3.5–5.1)
Sodium: 139 mmol/L (ref 135–145)

## 2019-12-07 LAB — GLUCOSE, CAPILLARY: Glucose-Capillary: 238 mg/dL — ABNORMAL HIGH (ref 70–99)

## 2019-12-07 MED ORDER — ATORVASTATIN CALCIUM 80 MG PO TABS: 80.0000 mg | ORAL_TABLET | Freq: Every day | ORAL | 3 refills | Status: DC

## 2019-12-07 MED ORDER — LOSARTAN POTASSIUM 25 MG PO TABS: 25.0000 mg | ORAL_TABLET | Freq: Every day | ORAL | 3 refills | Status: DC

## 2019-12-07 MED ORDER — ASPIRIN 81 MG PO TBEC: 81.0000 mg | DELAYED_RELEASE_TABLET | Freq: Every day | ORAL | 11 refills | Status: AC

## 2019-12-07 MED ORDER — METFORMIN HCL 500 MG PO TABS: 500.0000 mg | ORAL_TABLET | Freq: Two times a day (BID) | ORAL | 3 refills | Status: DC

## 2019-12-07 MED ORDER — TICAGRELOR 90 MG PO TABS: 90.0000 mg | ORAL_TABLET | Freq: Two times a day (BID) | ORAL | 3 refills | Status: DC

## 2019-12-07 MED ORDER — PANTOPRAZOLE SODIUM 40 MG PO TBEC: 40.0000 mg | DELAYED_RELEASE_TABLET | Freq: Every day | ORAL | 3 refills | Status: DC

## 2019-12-07 MED ORDER — FUROSEMIDE 20 MG PO TABS: 20.0000 mg | ORAL_TABLET | Freq: Every day | ORAL | 3 refills | Status: DC

## 2019-12-07 MED ORDER — ISOSORBIDE MONONITRATE ER 30 MG PO TB24: 30.0000 mg | ORAL_TABLET | Freq: Every day | ORAL | 3 refills | Status: DC

## 2019-12-07 MED ORDER — NITROGLYCERIN 0.4 MG SL SUBL
0.4000 mg | SUBLINGUAL_TABLET | SUBLINGUAL | 3 refills | Status: DC | PRN
Start: 2019-12-07 — End: 2020-12-18

## 2019-12-07 MED FILL — ATORVASTATIN CALCIUM 80 MG: 80 | 30 days supply | Qty: 30 | Fill #0

## 2019-12-07 MED FILL — FUROSEMIDE 20 MG TAB: 20 | 30 days supply | Qty: 30 | Fill #0

## 2019-12-07 MED FILL — metFORMIN HCL 500 MG TABS: 500 | 30 days supply | Qty: 60 | Fill #0

## 2019-12-07 MED FILL — ISOSORBIDE MN ER 30 MG TAB: 30 | 30 days supply | Qty: 30 | Fill #0

## 2019-12-07 MED FILL — BRILINTA 90 MG TABLET: 90 | 30 days supply | Qty: 60 | Fill #0

## 2019-12-07 MED FILL — LOSARTAN POTASSIUM 25 MG TA: 25 | 30 days supply | Qty: 30 | Fill #0

## 2019-12-07 MED FILL — ASPIRIN LOW DOSE 81 MG TBEC: 81 | 30 days supply | Qty: 30 | Fill #0

## 2019-12-07 MED FILL — PANTOPRAZOLE SOD DR 40 MG T: 40 | 30 days supply | Qty: 30 | Fill #0

## 2019-12-07 MED FILL — NITROGLYCERIN 0.4 MG TAB SL: 0.4 | 8 days supply | Qty: 25 | Fill #0

## 2019-12-07 NOTE — Progress Notes (Addendum)
CARDIAC REHAB PHASE I   PRE:  Rate/Rhythm: 84 SR    BP: sitting 143/67    SaO2: 97 RA  MODE:  Ambulation: 500 ft   POST:  Rate/Rhythm: 105 ST    BP: sitting 147/66     SaO2: 99 RA  Pt with quick pace and SOB. Sts she normally walks fast. VSS. Discussed MI, stent, Brilinta, restrictions, daily wts, low sodium, DM diet, exercise, NTG and CRPII. Pt very receptive. Will refer to [place] virtual CRPII. Understands importance of Brilinta and lifestyle change. Pt is interested in participating in Virtual Cardiac and Pulmonary Rehab. Pt advised that Virtual Cardiac and Pulmonary Rehab is provided at no cost to the patient.  Checklist:  1. Pt has smart device  ie smartphone and/or ipad for downloading an app  Yes 2. Reliable internet/wifi service    Yes 3. Understands how to use their smartphone and navigate within an app.  Yes Pt verbalized understanding and is in agreement.  [phone]   Nicole Barry CES, ACSM 12/07/2019 9:28 AM

## 2019-12-07 NOTE — Care Management (Signed)
12-07-19 1032 Case Manager spoke with patient- only receiving unemployment at this time. Currently not working and is without a primary care provider. Patient is agreeable to have Case Manager schedule a hospital follow up appointment at the [facility]- appointment placed on the AVS. MATCH completed for medication assistance, patient will pay $3.00 for each prescription. Transitions of Care Pharmacy to bring medications to the room prior to transition home. Patient will have transportation home. No further needs from Case Manager at this time. Gala Lewandowsky, RN,BSN Case Manager

## 2019-12-07 NOTE — Discharge Instructions (Signed)
Heart Attack The heart is a muscle that needs oxygen to survive. A heart attack is a condition that occurs when your heart does not get enough oxygen. When this happens, the heart muscle begins to die. This can cause permanent damage if not treated right away. A heart attack is a medical emergency. This condition may be called a myocardial infarction, or MI. It is also known as acute coronary syndrome (ACS). ACS is a term used to describe a group of conditions that affect blood flow to the heart. What are the causes? This condition may be caused by:  Atherosclerosis. This occurs when a fatty substance called plaque builds up in the arteries and blocks or reduces blood supply to the heart.  A blood clot. A blood clot can develop suddenly when plaque breaks up within an artery and blocks blood flow to the heart.  Low blood pressure.  An abnormal heartbeat (arrhythmia).  Conditions that cause a decrease of oxygen to the heart, such as anemiaorrespiratory failure.  A spasm, or severe tightening, of a blood vessel that cuts off blood flow to the heart.  Tearing of a coronary artery (spontaneous coronary artery dissection).  High blood pressure. What increases the risk? The following factors may make you more likely to develop this condition:  Aging. The older you are, the higher your risk.  Having a personal or family history of chest pain, heart attack, stroke, or narrowing of the arteries in the legs, arms, head, or stomach (peripheral artery disease).  Being female.  Smoking.  Not getting regular exercise.  Being overweight or obese.  Having high blood pressure.  Having high cholesterol (hypercholesterolemia).  Having diabetes.  Drinking too much alcohol.  Using illegal drugs, such as cocaine or methamphetamine. What are the signs or symptoms? Symptoms of this condition may vary, depending on factors like gender and age. Symptoms may include:  Chest pain. It may feel  like: ? Crushing or squeezing. ? Tightness, pressure, fullness, or heaviness.  Pain in the arm, neck, jaw, back, or upper body.  Shortness of breath.  Heartburn or upset stomach.  Nausea.  Sudden cold sweats.  Feeling tired.  Sudden light-headedness. How is this diagnosed? This condition may be diagnosed through tests, such as:  Electrocardiogram (ECG) to measure the electrical activity of your heart.  Blood tests to check for cardiac markers. These chemicals are released by a damaged heart muscle.  A test to evaluate blood flow and heart function (coronary angiogram).  CT scan to see the heart more clearly.  A test to evaluate the pumping action of the heart (echocardiogram). How is this treated? A heart attack must be treated as soon as possible. Treatment may include:  Medicines to: ? Break up or dissolve blood clots (fibrinolytic therapy). ? Thin blood and help prevent blood clots. ? Treat blood pressure. ? Improve blood flow to the heart. ? Reduce pain. ? Reduce cholesterol.  Angioplasty and stent placement. These are procedures to widen a blocked artery and keep it open.  Coronary artery bypass graft, CABG, or open heart surgery. This enables blood to flow to the heart by going around the blocked part of the artery.  Oxygen therapy if needed.  Cardiac rehabilitation. This improves your health and well-being through exercise, education, and counseling. Follow these instructions at home: Medicines  Take over-the-counter and prescription medicines only as told by your health care provider.  Do not take the following medicines unless your health care provider says it is okay  to take them: ? NSAIDs, such as ibuprofen. ? Supplements that contain vitamin A, vitamin E, or both. ? Hormone replacement therapy that contains estrogen with or without progestin. Lifestyle   Do not use any products that contain nicotine or tobacco, such as cigarettes, e-cigarettes,  and chewing tobacco. If you need help quitting, ask your health care provider.  Avoid secondhand smoke.  Exercise regularly. Ask your health care provider about participating in a cardiac rehabilitation program that helps you start exercising safely after a heart attack.  Eat a heart-healthy diet. Your health care provider will tell you what foods to eat.  Maintain a healthy weight.  Learn ways to manage stress.  Do not use illegal drugs. Alcohol use  Do not drink alcohol if: ? Your health care provider tells you not to drink. ? You are pregnant, may be pregnant, or are planning to become pregnant.  If you drink alcohol: ? Limit how much you use to:  0-1 drink a day for women.  0-2 drinks a day for men. ? Be aware of how much alcohol is in your drink. In the [place] one drink equals one 12 oz bottle of beer (355 mL), one 5 oz glass of wine (148 mL), or one 1 oz glass of hard liquor (44 mL). General instructions  Work with your health care provider to manage any other conditions you have, such as high blood pressure or diabetes. These conditions affect your heart.  Get screened for depression, and seek treatment if needed.  Keep your vaccinations up to date. Get the flu vaccine every year.  Keep all follow-up visits as told by your health care provider. This is important. Contact a health care provider if:  You feel overwhelmed or sad.  You have trouble doing your daily activities. Get help right away if:  You have sudden, unexplained discomfort in your chest, arms, back, neck, jaw, or upper body.  You have shortness of breath.  You suddenly start to sweat or your skin gets clammy.  You feel nauseous or you vomit.  You have unexplained tiredness or weakness.  You suddenly feel light-headed or dizzy.  You notice your heart starts to beat fast or feels like it is skipping beats.  You have blood pressure that is higher than 180/120. These symptoms may represent a  serious problem that is an emergency. Do not wait to see if the symptoms will go away. Get medical help right away. Call your local emergency services (911 in the [place] Do not drive yourself to the hospital. Summary  A heart attack, also called myocardial infarction, is a condition that occurs when your heart does not get enough oxygen. This is caused by anything that blocks or reduces blood flow to the heart.  Treatment is a combination of medicines and surgeries, if needed, to open the blocked arteries and restore blood flow to the heart.  A heart attack is an emergency. Get help right away if you have sudden discomfort in your chest, arms, back, neck, jaw, or upper body. Seek help if you feel nauseous, you vomit, or you feel light-headed or dizzy. This information is not intended to replace advice given to you by your health care provider. Make sure you discuss any questions you have with your health care provider. Document Revised: 09/08/2018 Document Reviewed: 09/12/2018 Elsevier Patient Education  2020 [employer]   ___________________________________________________________________________________________   Heart Failure Action Plan A heart failure action plan helps you understand what to do when you  have symptoms of heart failure. Follow the plan that was created by you and your health care provider. Review your plan each time you visit your health care provider. Red zone These signs and symptoms mean you should get medical help right away:  You have trouble breathing when resting.  You have a dry cough that is getting worse.  You have swelling or pain in your legs or abdomen that is getting worse.  You suddenly gain more than 2-3 lb (0.9-1.4 kg) in a day, or more than 5 lb (2.3 kg) in one week. This amount may be more or less depending on your condition.  You have trouble staying awake or you feel confused.  You have chest pain.  You do not have an appetite.  You pass  out. If you experience any of these symptoms:  Call your local emergency services (911 in the [place] right away or seek help at the emergency department of the nearest hospital. Yellow zone These signs and symptoms mean your condition may be getting worse and you should make some changes:  You have trouble breathing when you are active or you need to sleep with extra pillows.  You have swelling in your legs or abdomen.  You gain 2-3 lb (0.9-1.4 kg) in one day, or 5 lb (2.3 kg) in one week. This amount may be more or less depending on your condition.  You get tired easily.  You have trouble sleeping.  You have a dry cough. If you experience any of these symptoms:  Contact your health care provider within the next day.  Your health care provider may adjust your medicines. Green zone These signs mean you are doing well and can continue what you are doing:  You do not have shortness of breath.  You have very little swelling or no new swelling.  Your weight is stable (no gain or loss).  You have a normal activity level.  You do not have chest pain or any other new symptoms. Follow these instructions at home:  Take over-the-counter and prescription medicines only as told by your health care provider.  Weigh yourself daily. ? Call your health care provider if you gain more than ___3_______ lb in a day, or more than ____5______ lb in one week.  Eat a heart-healthy diet. Work with a diet and nutrition specialist (dietitian) to create an eating plan that is best for you.  Keep all follow-up visits as told by your health care provider. This is important. Where to find more information  American Heart Association: www.heart.org Summary  Follow the action plan that was created by you and your health care provider.  Get help right away if you have any symptoms in the Red zone. This information is not intended to replace advice given to you by your health care provider. Make sure  you discuss any questions you have with your health care provider. Document Revised: 05/14/2017 Document Reviewed: 07/11/2016 Elsevier Patient Education  2020 [employer].   ______________________________________________________________________   Low-Sodium Eating Plan Sodium, which is an element that makes up salt, helps you maintain a healthy balance of fluids in your body. Too much sodium can increase your blood pressure and cause fluid and waste to be held in your body. Your health care provider or dietitian may recommend following this plan if you have high blood pressure (hypertension), kidney disease, liver disease, or heart failure. Eating less sodium can help lower your blood pressure, reduce swelling, and protect your heart, liver,  and kidneys. What are tips for following this plan? General guidelines  Most people on this plan should limit their sodium intake to 1,500-2,000 mg (milligrams) of sodium each day. Reading food labels   The Nutrition Facts label lists the amount of sodium in one serving of the food. If you eat more than one serving, you must multiply the listed amount of sodium by the number of servings.  Choose foods with less than 140 mg of sodium per serving.  Avoid foods with 300 mg of sodium or more per serving. Shopping  Look for lower-sodium products, often labeled as "low-sodium" or "no salt added."  Always check the sodium content even if foods are labeled as "unsalted" or "no salt added".  Buy fresh foods. ? Avoid canned foods and premade or frozen meals. ? Avoid canned, cured, or processed meats  Buy breads that have less than 80 mg of sodium per slice. Cooking  Eat more home-cooked food and less restaurant, buffet, and fast food.  Avoid adding salt when cooking. Use salt-free seasonings or herbs instead of table salt or sea salt. Check with your health care provider or pharmacist before using salt substitutes.  Cook with plant-based oils,  such as canola, sunflower, or olive oil. Meal planning  When eating at a restaurant, ask that your food be prepared with less salt or no salt, if possible.  Avoid foods that contain MSG (monosodium glutamate). MSG is sometimes added to [place] food, bouillon, and some canned foods. What foods are recommended? The items listed may not be a complete list. Talk with your dietitian about what dietary choices are best for you. Grains Low-sodium cereals, including oats, puffed wheat and rice, and shredded wheat. Low-sodium crackers. Unsalted rice. Unsalted pasta. Low-sodium bread. Whole-grain breads and whole-grain pasta. Vegetables Fresh or frozen vegetables. "No salt added" canned vegetables. "No salt added" tomato sauce and paste. Low-sodium or reduced-sodium tomato and vegetable juice. Fruits Fresh, frozen, or canned fruit. Fruit juice. Meats and other protein foods Fresh or frozen (no salt added) meat, poultry, seafood, and fish. Low-sodium canned tuna and salmon. Unsalted nuts. Dried peas, beans, and lentils without added salt. Unsalted canned beans. Eggs. Unsalted nut butters. Dairy Milk. Soy milk. Cheese that is naturally low in sodium, such as ricotta cheese, fresh mozzarella, or Swiss cheese Low-sodium or reduced-sodium cheese. Cream cheese. Yogurt. Fats and oils Unsalted butter. Unsalted margarine with no trans fat. Vegetable oils such as canola or olive oils. Seasonings and other foods Fresh and dried herbs and spices. Salt-free seasonings. Low-sodium mustard and ketchup. Sodium-free salad dressing. Sodium-free light mayonnaise. Fresh or refrigerated horseradish. Lemon juice. Vinegar. Homemade, reduced-sodium, or low-sodium soups. Unsalted popcorn and pretzels. Low-salt or salt-free chips. What foods are not recommended? The items listed may not be a complete list. Talk with your dietitian about what dietary choices are best for you. Grains Instant hot cereals. Bread stuffing, pancake,  and biscuit mixes. Croutons. Seasoned rice or pasta mixes. Noodle soup cups. Boxed or frozen macaroni and cheese. Regular salted crackers. Self-rising flour. Vegetables Sauerkraut, pickled vegetables, and relishes. Olives. [place] fries. Onion rings. Regular canned vegetables (not low-sodium or reduced-sodium). Regular canned tomato sauce and paste (not low-sodium or reduced-sodium). Regular tomato and vegetable juice (not low-sodium or reduced-sodium). Frozen vegetables in sauces. Meats and other protein foods Meat or fish that is salted, canned, smoked, spiced, or pickled. Bacon, ham, sausage, hotdogs, corned beef, chipped beef, packaged lunch meats, salt pork, jerky, pickled herring, anchovies, regular canned tuna, sardines, salted  nuts. Dairy Processed cheese and cheese spreads. Cheese curds. Blue cheese. Feta cheese. String cheese. Regular cottage cheese. Buttermilk. Canned milk. Fats and oils Salted butter. Regular margarine. Ghee. Bacon fat. Seasonings and other foods Onion salt, garlic salt, seasoned salt, table salt, and sea salt. Canned and packaged gravies. Worcestershire sauce. Tartar sauce. Barbecue sauce. Teriyaki sauce. Soy sauce, including reduced-sodium. Steak sauce. Fish sauce. Oyster sauce. Cocktail sauce. Horseradish that you find on the shelf. Regular ketchup and mustard. Meat flavorings and tenderizers. Bouillon cubes. Hot sauce and Tabasco sauce. Premade or packaged marinades. Premade or packaged taco seasonings. Relishes. Regular salad dressings. Salsa. Potato and tortilla chips. Corn chips and puffs. Salted popcorn and pretzels. Canned or dried soups. Pizza. Frozen entrees and pot pies. Summary  Eating less sodium can help lower your blood pressure, reduce swelling, and protect your heart, liver, and kidneys.  Most people on this plan should limit their sodium intake to 1,500-2,000 mg (milligrams) of sodium each day.  Canned, boxed, and frozen foods are high in sodium.  Restaurant foods, fast foods, and pizza are also very high in sodium. You also get sodium by adding salt to food.  Try to cook at home, eat more fresh fruits and vegetables, and eat less fast food, canned, processed, or prepared foods. This information is not intended to replace advice given to you by your health care provider. Make sure you discuss any questions you have with your health care provider. Document Revised: 05/14/2017 Document Reviewed: 05/25/2016 Elsevier Patient Education  2020 [employer].

## 2019-12-07 NOTE — Discharge Summary (Signed)
Discharge Summary    Patient ID: [name] MRN: [id number]; DOB: 17-Aug-1955  Admit date: 12/05/2019 Discharge date: 12/07/2019  Primary Care Provider: System, Provider Not In  Primary Cardiologist: Chrystie Nose, MD  Primary Electrophysiologist:  None   Discharge Diagnoses    Principal Problem:   Non-ST elevation (NSTEMI) myocardial infarction [facility]) Active Problems:   Hypertension   Unstable angina (HCC)   NSTEMI (non-ST elevated myocardial infarction) (HCC)   Type 2 diabetes mellitus with hyperosmolarity without coma, without long-term current use of insulin (HCC)   Acute systolic heart failure (HCC)   Hyperlipidemia LDL goal <70   Diagnostic Studies/Procedures    Left heart cath 12/05/19:  Prox LAD lesion is 99% stenosed.  1st Diag lesion is 65% stenosed.  Mid LAD lesion is 30% stenosed.  A drug-eluting stent was successfully placed using a SYNERGY XD 3.50X16.  Post intervention, there is a 0% residual stenosis.   1. Severe proximal LAD stenosis 2. Successful PTCA/DES x 1 proximal LAD 3. Moderate stenosis in the ostium of a moderate caliber diagonal branch.  4. Elevated LVEDP  Recommendations: IV Lasix x 1. Echo in am. Continue ASA and Brilinta for one year. Continue statin and beta blocker. Medical management of Diagonal stenosis.   _____________   Echo 12/06/19: 1. Left ventricular ejection fraction, by estimation, is 30 to 35%. The  left ventricle has moderately decreased function. The left ventricle  demonstrates regional wall motion abnormalities (see scoring  diagram/findings for description). There is mild  asymmetric left ventricular hypertrophy. Left ventricular diastolic  parameters are consistent with Grade II diastolic dysfunction  (pseudonormalization). There is severe akinesis of the left ventricular,  mid-apical anteroseptal wall. There is moderate  dyskinesis of the left ventricular, apical apical segment.  2. Right ventricular systolic  function is normal. The right ventricular  size is normal. There is normal pulmonary artery systolic pressure.  3. The mitral valve is normal in structure. Trivial mitral valve  regurgitation.  4. The aortic valve is normal in structure. Aortic valve regurgitation is  not visualized. No aortic stenosis is present.    History of Present Illness     Nicole Barry is a 64 y.o. female with history of HTN, pre-diabetic, and depression presented to [facility] with chest pain.  Nicole Barry does not currently follow with cardiology. She presented to [facility] today after waking up with pain in her jaw/teeth. She took claritin and NSAID without relief. She reports chest discomfort developed throughout the day and symptoms worsened with exertion. At 2pm she was nauseous and vomited. EKG with anterior Q waves, mild ST elevation in anterior leads, no prior for comparison. HS troponin 1182.   Cardiology was consulted and accepted her in transfer. She was seen in [facility]. She reports ongoing left-sided chest pain radiating to her left shoulder and back 6/10. Heparin drip running. She has not received nitro.   She does have a strong family history of CAD. Brother had MI in his 30s. Father had MI in this 74s. She is a never smoker. She has lost her job and insurance, since then has stopped taking BP medication. She has not taking lisinopril, HCTZ, or metformin in over a year. She has intermittent bronchitis and was vaccinated against COVID in Apirl 2021.  Hospital Course     Consultants: none  NSTEMI CAD Concern for late presenting STEMI, but did not meet STEMI criteria on admission. Discussed with interventional team and she was ultimately taken urgently to the cath  lab. LHC revealed 99% occlusion of the proximal LAD treated with DES. Residual disease included 65% stenosis in D1 and 30% stenosis in the mid LAD HS troponin 1182 --> 7357. She was started on ASA and brilinta, BB and statin. Imdur and losartan added to her  regimen. Post-op Day 1 she had GI upset that resolved with GI cocktail. She had not taken her medications for over a year on presentation due to losing her medical insurance. Will consult CM to see if she can establish with community health and wellness for help with her medications.  No further chest pain. She has ambulated in the halls.   She does report headache, likely from the imdur. Will continue this, but if she is still having headaches at follow up, may need to switch this.    Ischemic cardiomyopathy Systolic and diastolic heart failure She had an elevated LVEDP during heart cath. Follow up echo revealed an EF of 30-35%, grade II DD, and no significant valvular disease.  She was diuresed with IV and PO lasix. She will discharge on 20 mg lasix daily.   Diabetes A1c 8.4%.  Restarted metformin. This needs to be followed outpatient.    Hyperlipidemia with LDL goal < 70 12/06/2019: Cholesterol 267; HDL 43; LDL Cholesterol 160; Triglycerides 319; VLDL 64  Started on 80 mg lipitor. Will need fasting lipids in 6 weeks.    We had a long discussion about her EF, daily weights, and sodium restriction as well as the importance of controlling her cholesterol and DM. She will establish with a PCP as above.    Did the patient have an acute coronary syndrome (MI, NSTEMI, STEMI, etc) this admission?:  Yes                               AHA/ACC Clinical Performance & Quality Measures: 1. Aspirin prescribed? - Yes 2. ADP Receptor Inhibitor (Plavix/Clopidogrel, Brilinta/Ticagrelor or Effient/Prasugrel) prescribed (includes medically managed patients)? - Yes 3. Beta Blocker prescribed? - Yes 4. High Intensity Statin (Lipitor 40-80mg  or Crestor 20-40mg ) prescribed? - Yes 5. EF assessed during THIS hospitalization? - Yes 6. For EF <40%, was ACEI/ARB prescribed? - Yes 7. For EF <40%, Aldosterone Antagonist (Spironolactone or Eplerenone) prescribed? - No - Reason:  BP marginal 8. Cardiac Rehab  Phase II ordered (including medically managed patients)? - Yes   _____________  Discharge Vitals Blood pressure 138/71, pulse 84, temperature 98.5 F (36.9 C), temperature source Oral, resp. rate 16, height 4\' 10"  (1.473 m), weight 89.4 kg, SpO2 98 %.  Filed Weights   12/05/19 1541 12/06/19 0329 12/07/19 0534  Weight: 90.7 kg 90.3 kg 89.4 kg   Physical Exam Constitutional:      Appearance: She is well-developed.  HENT:     Head: Normocephalic.  Neck:     Vascular: No JVD.  Cardiovascular:     Rate and Rhythm: Normal rate and regular rhythm.     Heart sounds: Normal heart sounds. No murmur heard.   Pulmonary:     Effort: Pulmonary effort is normal.     Breath sounds: Normal breath sounds. No wheezing.  Abdominal:     General: Bowel sounds are normal.     Palpations: Abdomen is soft.  Skin:    General: Skin is warm and dry.  Neurological:     General: No focal deficit present.     Mental Status: She is alert.  Psychiatric:  Mood and Affect: Mood normal.   Right radial cath site C/D/I   Labs & Radiologic Studies    CBC Recent Labs    12/05/19 1550 12/05/19 1550 12/06/19 0310 12/07/19 0434  WBC 9.3   < > 7.4 10.2  NEUTROABS 7.1  --   --   --   HGB 15.1*   < > 14.0 14.3  HCT 43.6   < > 41.4 42.2  MCV 89.9   < > 89.2 90.2  PLT 345   < > 298 313   < > = values in this interval not displayed.   Basic Metabolic Panel Recent Labs    16/10/96 0310 12/06/19 0925 12/07/19 0434  NA 139  --  139  K 4.1  --  4.5  CL 101  --  100  CO2 28  --  29  GLUCOSE 259*  --  214*  BUN 7*  --  7*  CREATININE 0.77  --  0.79  CALCIUM 8.8*  --  8.9  MG  --  1.7  --    Liver Function Tests Recent Labs    12/05/19 1550  AST 25  ALT 26  ALKPHOS 54  BILITOT 0.5  PROT 6.5  ALBUMIN 3.7   No results for input(s): LIPASE, AMYLASE in the last 72 hours. High Sensitivity Troponin:   Recent Labs  Lab 12/05/19 1550 12/06/19 0925  TROPONINIHS 1,182* 7,357*     BNP Invalid input(s): POCBNP D-Dimer No results for input(s): DDIMER in the last 72 hours. Hemoglobin A1C Recent Labs    12/06/19 0310  HGBA1C 8.4*   Fasting Lipid Panel Recent Labs    12/06/19 0310  CHOL 267*  HDL 43  LDLCALC 160*  TRIG 319*  CHOLHDL 6.2   Thyroid Function Tests No results for input(s): TSH, T4TOTAL, T3FREE, THYROIDAB in the last 72 hours.  Invalid input(s): FREET3 _____________  DG Chest 2 View  Result Date: 12/05/2019 CLINICAL DATA:  Chest pain EXAM: CHEST - 2 VIEW COMPARISON:  10/21/2007 FINDINGS: The heart size and mediastinal contours are within normal limits. Both lungs are clear. The visualized skeletal structures are unremarkable. IMPRESSION: No active cardiopulmonary disease. Electronically Signed   By: Marlan Palau M.D.   On: 12/05/2019 16:55   CARDIAC CATHETERIZATION  Result Date: 12/05/2019  Prox LAD lesion is 99% stenosed.  1st Diag lesion is 65% stenosed.  Mid LAD lesion is 30% stenosed.  A drug-eluting stent was successfully placed using a SYNERGY XD 3.50X16.  Post intervention, there is a 0% residual stenosis.  1. Severe proximal LAD stenosis 2. Successful PTCA/DES x 1 proximal LAD 3. Moderate stenosis in the ostium of a moderate caliber diagonal branch. 4. Elevated LVEDP Recommendations: IV Lasix x 1. Echo in am. Continue ASA and Brilinta for one year. Continue statin and beta blocker. Medical management of Diagonal stenosis.   ECHOCARDIOGRAM COMPLETE  Result Date: 12/06/2019    ECHOCARDIOGRAM REPORT   Patient Name:   Nicole Barry  Date of Exam: 12/06/2019 Medical Rec #:  [id number]  Height:       58.0 in Accession #:    [id number] Weight:       199.0 lb Date of Birth:  [birth year]  BSA:          1.817 m Patient Age:    64 years   BP:           148/77 mmHg Patient Gender: F          HR:  65 bpm. Exam Location:  Inpatient Procedure: 2D Echo and Intracardiac Opacification Agent Indications:    CAD Native Vessel 414.01 / I25.10  History:         Patient has no prior history of Echocardiogram examinations.                 Acute MI, Abnormal ECG; Risk Factors:Hypertension.  Sonographer:    Leta Junglingiffany Cooper RDCS Referring Phys: [id number] CHRISTOPHER D MCALHANY IMPRESSIONS  1. Left ventricular ejection fraction, by estimation, is 30 to 35%. The left ventricle has moderately decreased function. The left ventricle demonstrates regional wall motion abnormalities (see scoring diagram/findings for description). There is mild asymmetric left ventricular hypertrophy. Left ventricular diastolic parameters are consistent with Grade II diastolic dysfunction (pseudonormalization). There is severe akinesis of the left ventricular, mid-apical anteroseptal wall. There is moderate dyskinesis of the left ventricular, apical apical segment.  2. Right ventricular systolic function is normal. The right ventricular size is normal. There is normal pulmonary artery systolic pressure.  3. The mitral valve is normal in structure. Trivial mitral valve regurgitation.  4. The aortic valve is normal in structure. Aortic valve regurgitation is not visualized. No aortic stenosis is present. FINDINGS  Left Ventricle: Left ventricular ejection fraction, by estimation, is 30 to 35%. The left ventricle has moderately decreased function. The left ventricle demonstrates regional wall motion abnormalities. Severe akinesis of the left ventricular, mid-apical anteroseptal wall. Moderate dyskinesis of the left ventricular, apical apical segment. Definity contrast agent was given IV to delineate the left ventricular endocardial borders. The left ventricular internal cavity size was normal in size. There is mild asymmetric left ventricular hypertrophy. Left ventricular diastolic parameters are consistent with Grade II diastolic dysfunction (pseudonormalization). Right Ventricle: The right ventricular size is normal. No increase in right ventricular wall thickness. Right ventricular systolic function is  normal. There is normal pulmonary artery systolic pressure. The tricuspid regurgitant velocity is 2.46 m/s, and  with an assumed right atrial pressure of 8 mmHg, the estimated right ventricular systolic pressure is 32.2 mmHg. Left Atrium: Left atrial size was normal in size. Right Atrium: Right atrial size was normal in size. Pericardium: There is no evidence of pericardial effusion. Mitral Valve: The mitral valve is normal in structure. Trivial mitral valve regurgitation. Tricuspid Valve: The tricuspid valve is grossly normal. Tricuspid valve regurgitation is trivial. Aortic Valve: The aortic valve is normal in structure. Aortic valve regurgitation is not visualized. No aortic stenosis is present. Pulmonic Valve: The pulmonic valve was normal in structure. Pulmonic valve regurgitation is not visualized. No evidence of pulmonic stenosis. Aorta: The aortic root and ascending aorta are structurally normal, with no evidence of dilitation. IAS/Shunts: The atrial septum is grossly normal.  LEFT VENTRICLE PLAX 2D LVIDd:         5.40 cm      Diastology LVIDs:         3.40 cm      LV e' lateral:   5.98 cm/s LV PW:         0.70 cm      LV E/e' lateral: 15.0 LV IVS:        1.30 cm      LV e' medial:    4.47 cm/s LVOT diam:     1.90 cm      LV E/e' medial:  20.0 LV SV:         50 LV SV Index:   27 LVOT Area:     2.84 cm  LV Volumes (  MOD) LV vol d, MOD A2C: 110.0 ml LV vol d, MOD A4C: 106.0 ml LV vol s, MOD A2C: 55.6 ml LV vol s, MOD A4C: 54.9 ml LV SV MOD A2C:     54.4 ml LV SV MOD A4C:     106.0 ml LV SV MOD BP:      50.2 ml RIGHT VENTRICLE RV S prime:     8.93 cm/s TAPSE (M-mode): 1.6 cm LEFT ATRIUM             Index       RIGHT ATRIUM           Index LA diam:        3.40 cm 1.87 cm/m  RA Area:     10.80 cm LA Vol (A2C):   41.3 ml 22.73 ml/m RA Volume:   22.70 ml  12.49 ml/m LA Vol (A4C):   39.9 ml 21.96 ml/m LA Biplane Vol: 43.2 ml 23.77 ml/m  AORTIC VALVE LVOT Vmax:   89.40 cm/s LVOT Vmean:  61.700 cm/s LVOT VTI:     0.175 m  AORTA Ao Root diam: 3.10 cm MITRAL VALVE               TRICUSPID VALVE MV Area (PHT): 4.80 cm    TR Peak grad:   24.2 mmHg MV Decel Time: 158 msec    TR Vmax:        246.00 cm/s MV E velocity: 89.50 cm/s MV A velocity: 60.80 cm/s  SHUNTS MV E/A ratio:  1.47        Systemic VTI:  0.18 m                            Systemic Diam: 1.90 cm Kristeen Miss MD Electronically signed by Kristeen Miss MD Signature Date/Time: 12/06/2019/10:54:16 AM    Final    Disposition   Pt is being discharged home today in good condition.  Follow-up Plans & Appointments     Follow-up Information    Marcelino Duster, [place] Follow up on 12/14/2019.   Specialties: Physician Assistant, Cardiology, Radiology Why: 11:45 Contact information: [address] [place] [address] [phone]        [facility] Follow up.   Why: Walk in or call for an appt ASAP - will need medications from their pharmacy Contact information: [address] [place] [address] [phone]             Discharge Instructions    Ambulatory referral to Nutrition and Diabetic Education   Complete by: As directed       Discharge Medications   Allergies as of 12/07/2019      Reactions   Sulfa Antibiotics Rash      Medication List    STOP taking these medications   ciprofloxacin 500 MG tablet Commonly known as: Cipro   meloxicam 7.5 MG tablet Commonly known as: Mobic     TAKE these medications   albuterol 108 (90 Base) MCG/ACT inhaler Commonly known as: VENTOLIN HFA Inhale 2 puffs into the lungs every 6 (six) hours as needed for wheezing or shortness of breath.   aspirin 81 MG EC tablet Take 1 tablet (81 mg total) by mouth daily. Swallow whole.   atorvastatin 80 MG tablet Commonly known as: LIPITOR Take 1 tablet (80 mg total) by mouth daily.   escitalopram 10 MG tablet Commonly known as: LEXAPRO Take 10  mg by mouth daily.   fluconazole  150 MG tablet Commonly known as: Diflucan Take 1 tablet (150 mg total) by mouth daily.   fluticasone 50 MCG/ACT nasal spray Commonly known as: FLONASE Place 1 spray into both nostrils daily.   furosemide 20 MG tablet Commonly known as: LASIX Take 1 tablet (20 mg total) by mouth daily.   HYDROcodone-acetaminophen 5-325 MG tablet Commonly known as: NORCO/VICODIN Take 1 tablet by mouth every 6 (six) hours as needed for moderate pain.   isosorbide mononitrate 30 MG 24 hr tablet Commonly known as: IMDUR Take 1 tablet (30 mg total) by mouth daily.   loratadine 10 MG tablet Commonly known as: CLARITIN Take 10 mg by mouth daily.   losartan 25 MG tablet Commonly known as: COZAAR Take 1 tablet (25 mg total) by mouth daily.   metFORMIN 500 MG tablet Commonly known as: GLUCOPHAGE Take 1 tablet (500 mg total) by mouth 2 (two) times daily with a meal.   metroNIDAZOLE 500 MG tablet Commonly known as: Flagyl Take 1 tablet (500 mg total) by mouth 3 (three) times daily.   oxyCODONE-acetaminophen 5-325 MG tablet Commonly known as: Roxicet Take 1 tablet by mouth every 4 (four) hours as needed for severe pain.   pantoprazole 40 MG tablet Commonly known as: PROTONIX Take 1 tablet (40 mg total) by mouth daily.   ticagrelor 90 MG Tabs tablet Commonly known as: BRILINTA Take 1 tablet (90 mg total) by mouth 2 (two) times daily.          Outstanding Labs/Studies   TOC  Lipids 6 weeks  A1c 3 months  Duration of Discharge Encounter   Greater than 30 minutes including physician time.  Signed, Roe Rutherford Deegan Valentino, PA 12/07/2019, 8:36 AM

## 2019-12-07 NOTE — Progress Notes (Signed)
°  Progress Note   Date: 12/07/2019  Patient Name: Nicole Barry        MRN#: [id number]   Clarification of diagnosis:  This patient has a Body mass index of 41.21 kg/m, which constitutes morbid obesity.     Marcelino Duster, PA-C 12/07/2019, 3:09 PM [phone] [facility] [address] [place], [place] [address]

## 2019-12-08 ENCOUNTER — Encounter: Payer: Self-pay | Admitting: Internal Medicine

## 2019-12-08 NOTE — Telephone Encounter (Signed)
error 

## 2019-12-13 NOTE — Progress Notes (Addendum)
Cardiology Office Note:    Date:  12/14/2019   ID:  Nicole Barry, DOB July 22, 1955, MRN [id number]  PCP:  System, Provider Not In  Cardiologist:  Chrystie Nose, MD   Referring MD: No ref. provider found   Chief Complaint  Patient presents with  . Hospitalization Follow-up    CAD, NSTEMI    History of Present Illness:    Nicole Barry is a 64 y.o. female with a hx of hypertension, DM 2, and hyperlipidemia.  [employer] with chest pain.  EKG with anterior Q waves and mild ST elevation in anterior leads and a high-sensitivity troponin of 1182.  Was transferred to [facility], ED and cardiology accepted admission.  She has a strong family history of CAD with a brother had having an MI in his 75s, father MI in his 72s.  She is a never smoker.  She did lose her job and insurance and stopped taking her blood pressure medication.  She had not taken medication for over a year (lisinopril, HCTZ, or Metformin).  There was concern for late presenting STEMI, but did not meet STEMI criteria on admission.  Decision was made to obtain definitive angiography.  Catheterization revealed 99% occlusion of the proximal LAD treated with DES.  She does have residual disease including 65% stenosis in D1 and 30% stenosis in the mid LAD.  High-sensitivity troponin trended from 11 82-70 357.  She was discharged on aspirin, Brilinta, beta-blocker, and statin.  Imdur and losartan were added to her regimen the next day.  We recommended establishing with community health and wellness for medication assistance she is uninsured.  On day of discharge, she does report mild headache with Imdur.  This was continued on discharge; however, if she still had a persistent headache will discontinue Imdur at follow-up.  Echocardiogram with an EF of 30 to 35%, grade 2 diastolic dysfunction, and no significant valvular disease.  She presents for TOC follow-up. She denies chest pain. She does have some SOB with brilinta. This is  tolerable and she will try caffeine. She has taken her BP sporadically at home and has been ranging in the 130s. She will keep a BP log and bring to follow up. She is here with her boyfriend who is helping her at home. She has an appt with community health and wellness at the end of this month. I have provided 8 days of brilinta samples to get her through that appt. BB had been held at discharge due to some bradycardia. HR 90s here.    Past Medical History:  Diagnosis Date  . Anxiety   . CAD (coronary artery disease)   . Depression   . DM (diabetes mellitus) (HCC)   . HTN (hypertension)   . Hyperlipidemia LDL goal <70   . Hypertension   . NSTEMI (non-ST elevated myocardial infarction) (HCC)    12/05/19: DES-LAD    Past Surgical History:  Procedure Laterality Date  . BREAST SURGERY    . CESAREAN SECTION    . CORONARY STENT INTERVENTION N/A 12/05/2019   Procedure: CORONARY STENT INTERVENTION;  Surgeon: Kathleene Hazel, MD;  Location: [facility] INVASIVE CV LAB;  Service: Cardiovascular;  Laterality: N/A;  . LEFT HEART CATH AND CORONARY ANGIOGRAPHY N/A 12/05/2019   Procedure: LEFT HEART CATH AND CORONARY ANGIOGRAPHY;  Surgeon: Kathleene Hazel, MD;  Location: [facility] INVASIVE CV LAB;  Service: Cardiovascular;  Laterality: N/A;  . rotator cull surg     lt shoulder  Current Medications: Current Meds  Medication Sig  . albuterol (VENTOLIN HFA) 108 (90 Base) MCG/ACT inhaler Inhale 2 puffs into the lungs every 6 (six) hours as needed for wheezing or shortness of breath.  Marland Kitchen aspirin EC 81 MG EC tablet Take 1 tablet (81 mg total) by mouth daily. Swallow whole.  Marland Kitchen atorvastatin (LIPITOR) 80 MG tablet Take 1 tablet (80 mg total) by mouth daily.  Marland Kitchen escitalopram (LEXAPRO) 10 MG tablet Take 10 mg by mouth daily.  . fluticasone (FLONASE) 50 MCG/ACT nasal spray Place 1 spray into both nostrils daily.  . furosemide (LASIX) 20 MG tablet Take 1 tablet (20 mg total) by mouth daily.  . isosorbide  mononitrate (IMDUR) 30 MG 24 hr tablet Take 1 tablet (30 mg total) by mouth daily.  Marland Kitchen loratadine (CLARITIN) 10 MG tablet Take 10 mg by mouth daily.  Marland Kitchen losartan (COZAAR) 25 MG tablet Take 1 tablet (25 mg total) by mouth daily.  . metFORMIN (GLUCOPHAGE) 500 MG tablet Take 1 tablet (500 mg total) by mouth 2 (two) times daily with a meal.  . nitroGLYCERIN (NITROSTAT) 0.4 MG SL tablet Place 1 tablet (0.4 mg total) under the tongue every 5 (five) minutes as needed for chest pain.  . pantoprazole (PROTONIX) 40 MG tablet Take 1 tablet (40 mg total) by mouth daily.  . ticagrelor (BRILINTA) 90 MG TABS tablet Take 1 tablet (90 mg total) by mouth 2 (two) times daily.  . [DISCONTINUED] atorvastatin (LIPITOR) 80 MG tablet Take 1 tablet (80 mg total) by mouth daily.  . [DISCONTINUED] atorvastatin (LIPITOR) 80 MG tablet Take 1 tablet (80 mg total) by mouth daily.  . [DISCONTINUED] furosemide (LASIX) 20 MG tablet Take 1 tablet (20 mg total) by mouth daily.  . [DISCONTINUED] furosemide (LASIX) 20 MG tablet Take 1 tablet (20 mg total) by mouth daily.  . [DISCONTINUED] losartan (COZAAR) 25 MG tablet Take 1 tablet (25 mg total) by mouth daily.  . [DISCONTINUED] losartan (COZAAR) 25 MG tablet Take 1 tablet (25 mg total) by mouth daily.     Allergies:   Sulfa antibiotics   Social History   Socioeconomic History  . Marital status: Legally Separated    Spouse name: Not on file  . Number of children: Not on file  . Years of education: Not on file  . Highest education level: Not on file  Occupational History  . Not on file  Tobacco Use  . Smoking status: Never Smoker  . Smokeless tobacco: Never Used  Substance and Sexual Activity  . Alcohol use: Yes    Comment: occasional  . Drug use: No  . Sexual activity: Not on file  Other Topics Concern  . Not on file  Social History Narrative  . Not on file   Social Determinants of Health   Financial Resource Strain:   . Difficulty of Paying Living Expenses:     Food Insecurity:   . Worried About Programme researcher, broadcasting/film/video in the Last Year:   . Barista in the Last Year:   Transportation Needs:   . Freight forwarder (Medical):   Marland Kitchen Lack of Transportation (Non-Medical):   Physical Activity:   . Days of Exercise per Week:   . Minutes of Exercise per Session:   Stress:   . Feeling of Stress :   Social Connections:   . Frequency of Communication with Friends and Family:   . Frequency of Social Gatherings with Friends and Family:   . Attends Religious Services:   .  Active Member of Clubs or Organizations:   . Attends Banker Meetings:   Marland Kitchen Marital Status:      Family History: The patient's family history is negative for Colon cancer.  ROS:   Please see the history of present illness.     All other systems reviewed and are negative.  EKGs/Labs/Other Studies Reviewed:    The following studies were reviewed today:  Left heart cath 12/05/19:  Prox LAD lesion is 99% stenosed.  1st Diag lesion is 65% stenosed.  Mid LAD lesion is 30% stenosed.  A drug-eluting stent was successfully placed using a SYNERGY XD [id number].  Post intervention, there is a 0% residual stenosis.  1. Severe proximal LAD stenosis 2. Successful PTCA/DES x 1 proximal LAD 3. Moderate stenosis in the ostium of a moderate caliber diagonal branch.  4. Elevated LVEDP  Recommendations: IV Lasix x 1. Echo in am. Continue ASA and Brilinta for one year. Continue statin and beta blocker. Medical management of Diagonal stenosis.   _____________   Echo 12/06/19: 1. Left ventricular ejection fraction, by estimation, is 30 to 35%. The  left ventricle has moderately decreased function. The left ventricle  demonstrates regional wall motion abnormalities (see scoring  diagram/findings for description). There is mild  asymmetric left ventricular hypertrophy. Left ventricular diastolic  parameters are consistent with Grade II diastolic dysfunction   (pseudonormalization). There is severe akinesis of the left ventricular,  mid-apical anteroseptal wall. There is moderate  dyskinesis of the left ventricular, apical apical segment.  2. Right ventricular systolic function is normal. The right ventricular  size is normal. There is normal pulmonary artery systolic pressure.  3. The mitral valve is normal in structure. Trivial mitral valve  regurgitation.  4. The aortic valve is normal in structure. Aortic valve regurgitation is  not visualized. No aortic stenosis is present.   EKG:  EKG is  ordered today.  The ekg ordered today demonstrates sinus rhythm with HR 90, TWI lateral leads, improved  Recent Labs: 12/05/2019: ALT 26 12/06/2019: Magnesium 1.7 12/07/2019: BUN 7; Creatinine, Ser 0.79; Hemoglobin 14.3; Platelets 313; Potassium 4.5; Sodium 139  Recent Lipid Panel    Component Value Date/Time   CHOL 267 (H) 12/06/2019 0310   TRIG 319 (H) 12/06/2019 0310   HDL 43 12/06/2019 0310   CHOLHDL 6.2 12/06/2019 0310   VLDL 64 (H) 12/06/2019 0310   LDLCALC 160 (H) 12/06/2019 0310    Physical Exam:    VS:  BP (!) 146/90   Pulse 90   Ht  (1.448 m)   Wt 194 lb 6.4 oz (88.2 kg)   SpO2 95%   BMI 42.07 kg/m     Wt Readings from Last 3 Encounters:  12/14/19 194 lb 6.4 oz (88.2 kg)  12/07/19 197 lb 3.2 oz (89.4 kg)  05/18/14 192 lb 3.9 oz (87.2 kg)     GEN: Well nourished, well developed in no acute distress HEENT: Normal NECK: No JVD; No carotid bruits LYMPHATICS: No lymphadenopathy CARDIAC: RRR, no murmurs, rubs, gallops RESPIRATORY:  Clear to auscultation without rales, wheezing or rhonchi  ABDOMEN: Soft, non-tender, non-distended MUSCULOSKELETAL:  No edema; No deformity  SKIN: Warm and dry NEUROLOGIC:  Alert and oriented x 3 PSYCHIATRIC:  Normal affect  Right radial cath site C/D/I  ASSESSMENT:    1. Non-ST elevation (NSTEMI) myocardial infarction (HCC)   2. Essential hypertension   3. Chronic combined systolic  and diastolic heart failure, NYHA class 1 (HCC)   4. Hyperlipidemia LDL  goal <70   5. Ischemic cardiomyopathy    PLAN:    In order of problems listed above:  CAD NSTEMI status post DES to proximal LAD - 99% stenosis in the proximal LAD treated with DES, residual disease as above - Continue dual antiplatelet therapy for at least 12 months uninterrupted - no chest pain, but breathlessness with brilinta - instructed to try to caffeine - will stop imdur for headache - will start low dose lopressor - 25 mg torpol   New onset systolic and diastolic heart failure Ischemic cardiomyopathy - EF during her admission was 30 to 35%, grade 2 diastolic dysfunction, no significant valvular disease - She is euvolemic on exam at discharge and did not require diuretic - She is weighing daily - 194 lbs at home - doing well on 20 mg lasix - will try to titrate losartan if able at follow up in 2 weeks with BP log - will need repeat echo in 3 months on optimal therapy   Diabetes - A1C was 8.4% - Metformin was restarted - Follow-up with PCP - she continues to have significant nausea and diarrhea, which I think could be her metformin - may try extended release, but will defer to PCP   Hyperlipidemia with LDL goal less than 70 12/06/2019: Cholesterol 267; HDL 43; LDL Cholesterol 160; Triglycerides 319; VLDL 64 - Started 80 mg Lipitor - Repeat fasting labs in 6 weeks    Hypertension - pressure elevated today, but has been 130s systolic at home - D/C imdur for headache - will start low does BB as above - she will keep BP log, will try to titrate losartan if able   Follow up in 2 weeks with BP log.    Medication Adjustments/Labs and Tests Ordered: Current medicines are reviewed at length with the patient today.  Concerns regarding medicines are outlined above.  Orders Placed This Encounter  Procedures  . EKG 12-Lead   Meds ordered this encounter  Medications  . DISCONTD: metoprolol  succinate (TOPROL XL) 25 MG 24 hr tablet    Sig: Take 1 tablet (25 mg total) by mouth daily.    Dispense:  30 tablet    Refill:  3  . DISCONTD: atorvastatin (LIPITOR) 80 MG tablet    Sig: Take 1 tablet (80 mg total) by mouth daily.    Dispense:  30 tablet    Refill:  3  . DISCONTD: furosemide (LASIX) 20 MG tablet    Sig: Take 1 tablet (20 mg total) by mouth daily.    Dispense:  30 tablet    Refill:  3  . DISCONTD: losartan (COZAAR) 25 MG tablet    Sig: Take 1 tablet (25 mg total) by mouth daily.    Dispense:  30 tablet    Refill:  3  . atorvastatin (LIPITOR) 80 MG tablet    Sig: Take 1 tablet (80 mg total) by mouth daily.    Dispense:  30 tablet    Refill:  3  . furosemide (LASIX) 20 MG tablet    Sig: Take 1 tablet (20 mg total) by mouth daily.    Dispense:  30 tablet    Refill:  3  . losartan (COZAAR) 25 MG tablet    Sig: Take 1 tablet (25 mg total) by mouth daily.    Dispense:  30 tablet    Refill:  3  . metoprolol succinate (TOPROL XL) 25 MG 24 hr tablet    Sig: Take 1 tablet (25 mg total)  by mouth daily.    Dispense:  30 tablet    Refill:  3    Signed, Marcelino Duster, [place]  12/14/2019 1:51 PM    [facility]

## 2019-12-14 ENCOUNTER — Other Ambulatory Visit: Payer: Self-pay

## 2019-12-14 ENCOUNTER — Encounter: Payer: Self-pay | Admitting: Physician Assistant

## 2019-12-14 ENCOUNTER — Other Ambulatory Visit: Payer: Self-pay | Admitting: Physician Assistant

## 2019-12-14 ENCOUNTER — Ambulatory Visit (INDEPENDENT_AMBULATORY_CARE_PROVIDER_SITE_OTHER): Payer: Self-pay | Admitting: Physician Assistant

## 2019-12-14 VITALS — BP 146/90 | HR 90 | Ht <= 58 in | Wt 194.4 lb

## 2019-12-14 DIAGNOSIS — I255 Ischemic cardiomyopathy: Secondary | ICD-10-CM

## 2019-12-14 DIAGNOSIS — I5042 Chronic combined systolic (congestive) and diastolic (congestive) heart failure: Secondary | ICD-10-CM

## 2019-12-14 DIAGNOSIS — I214 Non-ST elevation (NSTEMI) myocardial infarction: Secondary | ICD-10-CM

## 2019-12-14 DIAGNOSIS — E785 Hyperlipidemia, unspecified: Secondary | ICD-10-CM

## 2019-12-14 DIAGNOSIS — I10 Essential (primary) hypertension: Secondary | ICD-10-CM

## 2019-12-14 MED ORDER — LOSARTAN POTASSIUM 25 MG PO TABS: 25.0000 mg | ORAL_TABLET | Freq: Every day | ORAL | 3 refills | Status: DC

## 2019-12-14 MED ORDER — METOPROLOL SUCCINATE ER 25 MG PO TB24: 25.0000 mg | ORAL_TABLET | Freq: Every day | ORAL | 3 refills | Status: DC

## 2019-12-14 MED ORDER — FUROSEMIDE 20 MG PO TABS: 20.0000 mg | ORAL_TABLET | Freq: Every day | ORAL | 3 refills | Status: DC

## 2019-12-14 MED ORDER — ATORVASTATIN CALCIUM 80 MG PO TABS: 80.0000 mg | ORAL_TABLET | Freq: Every day | ORAL | 3 refills | Status: DC

## 2019-12-14 MED ORDER — METOPROLOL SUCCINATE ER 25 MG PO TB24
25.0000 mg | ORAL_TABLET | Freq: Every day | ORAL | 3 refills | Status: DC
Start: 2019-12-14 — End: 2019-12-14

## 2019-12-14 MED FILL — ATORVASTATIN 80 MG TABLET: 80 | 30 days supply | Qty: 30 | Fill #0

## 2019-12-14 MED FILL — FUROSEMIDE 20 MG TAB: 20 | 30 days supply | Qty: 30 | Fill #0

## 2019-12-14 MED FILL — METOPROLOL SUCCINATE ER 25: 25 | 30 days supply | Qty: 30 | Fill #0

## 2019-12-14 MED FILL — LOSARTAN POTASSIUM 25 MG TA: 25 | 30 days supply | Qty: 30 | Fill #0

## 2019-12-14 NOTE — Patient Instructions (Signed)
Medication Instructions:  Your physician recommends that you continue on your current medications as directed. Please refer to the Current Medication list given to you today.  START Metoprolol Succinate 25 mg daily.  *If you need a refill on your cardiac medications before your next appointment, please call your pharmacy*    Follow-Up: At [facility], you and your health needs are our priority.  As part of our continuing mission to provide you with exceptional heart care, we have created designated Provider Care Teams.  These Care Teams include your primary Cardiologist (physician) and Advanced Practice Providers (APPs -  Physician Assistants and Nurse Practitioners) who all work together to provide you with the care you need, when you need it.  We recommend signing up for the patient portal called "MyChart".  Sign up information is provided on this After Visit Summary.  MyChart is used to connect with patients for Virtual Visits (Telemedicine).  Patients are able to view lab/test results, encounter notes, upcoming appointments, etc.  Non-urgent messages can be sent to your provider as well.   To learn more about what you can do with MyChart, go to [email or web address].    Your next appointment:   Thursday, 01/11/20 at 11:15 AM  The format for your next appointment:   In Person  Provider:   Corine Shelter, PA   Please schedule a 3 month in person follow-up appointment with Dr. Rennis Golden.   Other Instructions Your physician has requested that you regularly monitor and record your blood pressure readings at home, 2 hours after taking your medications and after resting for about 5-10 minutes. Please use the same machine at the same time of day to check your readings and record them to bring to your follow-up visit.

## 2019-12-15 ENCOUNTER — Telehealth: Payer: Self-pay | Admitting: Internal Medicine

## 2019-12-15 NOTE — Telephone Encounter (Signed)
The patient was calling requesting a prescription for Diflucan. She stated that this was discussed at her appointment yesterday.

## 2019-12-15 NOTE — Telephone Encounter (Signed)
Pt c/o medication issue:  1. Name of Medication: Diflucan  2. How are you currently taking this medication (dosage and times per day)? N/a  3. Are you having a reaction (difficulty breathing--STAT)? no  4. What is your medication issue? Patient saw Micah Flesher yesterday and she states she was going to call in this medication for her. She wants to know if she can do that today and send to [facility] - [place], [place] - [address]. Sending here because I'm unsure if we can send this prescription in for yeast infection.

## 2019-12-21 ENCOUNTER — Telehealth (HOSPITAL_COMMUNITY): Payer: Self-pay

## 2019-12-21 NOTE — Telephone Encounter (Signed)
Called patient to see if she was interested in participating in the Cardiac/VC Rehab Program. Patient stated yes. Patient will come in for orientation on 01/16/20 @ 10AM and will do the VCR program.  Mailed letter

## 2019-12-21 NOTE — Telephone Encounter (Signed)
Pt is interested in participating in Virtual Cardiac and Pulmonary Rehab. Pt advised that Virtual Cardiac and Pulmonary Rehab is provided at no cost to the patient.  Checklist:  1. Pt has smart device  ie smartphone and/or ipad for downloading an app  Yes 2. Reliable internet/wifi service    Yes 3. Understands how to use their smartphone and navigate within an app.  Yes   Pt verbalized understanding and is in agreement.  

## 2019-12-25 ENCOUNTER — Telehealth: Payer: Self-pay | Admitting: Internal Medicine

## 2019-12-25 NOTE — Telephone Encounter (Signed)
Spoke with pt and made her aware of recommendations.  Pt appreciative for call.  

## 2019-12-25 NOTE — Telephone Encounter (Signed)
Agree to contact PCP regarding metformin but if side effect becomes to unbareable, patient can d/c until she is seen.  Imodium will be fine to take.  Dose: 4 mg (2 capsules) orally followed by 2 mg (1 capsule) after each unformed stool

## 2019-12-25 NOTE — Telephone Encounter (Signed)
Pt c/o medication issue:  1. Name of Medication: metFORMIN (GLUCOPHAGE) 500 MG tablet  2. How are you currently taking this medication (dosage and times per day)? 2 times daily  3. Are you having a reaction (difficulty breathing--STAT)? no  4. What is your medication issue? Patient states she is has been having diarrhea and is not sure if it is, because of the medication. She states she also has been getting very hot, nauseas, and dizzy when she moves around a lot. She would like to know if she can take an over the counter medication imodium to help with her diarrhea.

## 2019-12-25 NOTE — Telephone Encounter (Signed)
Pt has been having diarrhea since starting Metformin.  Advised this is an unfortunate side effect of this medication that usually resolves after being on it a little while.  Pt has been taking since she was in the hospital with no improvement. Went to the grocery store yesterday and suddenly became flush, nauseous and dizzy.  Went to the car and cooled off and symptoms resolved.  This is the first time this has happened.  Advised pt to continue to monitor these symptoms.  Advised pt to reach out to PCP about Metformin.  States she hasn't seen them since 2018 and is getting established with [facility] since she no longer has insurance.  Appt with them isn't until 7/26.  She would like to know if she can take Imodium to help with the loose stools until she is seen on the 26th.  Will route to Pharmacy team to see if any contraindications for Imodium.

## 2020-01-08 ENCOUNTER — Ambulatory Visit: Payer: Self-pay | Attending: Family Medicine | Admitting: Family Medicine

## 2020-01-08 ENCOUNTER — Encounter: Payer: Self-pay | Admitting: Family Medicine

## 2020-01-08 ENCOUNTER — Other Ambulatory Visit: Payer: Self-pay

## 2020-01-08 ENCOUNTER — Other Ambulatory Visit: Payer: Self-pay | Admitting: Family Medicine

## 2020-01-08 VITALS — BP 145/71 | HR 62 | Temp 97.7°F | Ht 58.27 in | Wt 194.0 lb

## 2020-01-08 DIAGNOSIS — I10 Essential (primary) hypertension: Secondary | ICD-10-CM

## 2020-01-08 DIAGNOSIS — Z1159 Encounter for screening for other viral diseases: Secondary | ICD-10-CM

## 2020-01-08 DIAGNOSIS — L732 Hidradenitis suppurativa: Secondary | ICD-10-CM

## 2020-01-08 DIAGNOSIS — F329 Major depressive disorder, single episode, unspecified: Secondary | ICD-10-CM

## 2020-01-08 DIAGNOSIS — Z114 Encounter for screening for human immunodeficiency virus [HIV]: Secondary | ICD-10-CM

## 2020-01-08 DIAGNOSIS — E1100 Type 2 diabetes mellitus with hyperosmolarity without nonketotic hyperglycemic-hyperosmolar coma (NKHHC): Secondary | ICD-10-CM

## 2020-01-08 DIAGNOSIS — I214 Non-ST elevation (NSTEMI) myocardial infarction: Secondary | ICD-10-CM

## 2020-01-08 DIAGNOSIS — Z1211 Encounter for screening for malignant neoplasm of colon: Secondary | ICD-10-CM

## 2020-01-08 DIAGNOSIS — F419 Anxiety disorder, unspecified: Secondary | ICD-10-CM | POA: Insufficient documentation

## 2020-01-08 LAB — GLUCOSE, POCT (MANUAL RESULT ENTRY): POC Glucose: 183 mg/dl — AB (ref 70–99)

## 2020-01-08 MED ORDER — EMPAGLIFLOZIN 10 MG PO TABS: 10.0000 mg | ORAL_TABLET | Freq: Every day | ORAL | 3 refills | Status: DC

## 2020-01-08 MED ORDER — LOSARTAN POTASSIUM 50 MG PO TABS: 50.0000 mg | ORAL_TABLET | Freq: Every day | ORAL | 3 refills | Status: DC

## 2020-01-08 MED ORDER — ESCITALOPRAM OXALATE 10 MG PO TABS: 10.0000 mg | ORAL_TABLET | Freq: Every day | ORAL | 3 refills | Status: DC

## 2020-01-08 MED ORDER — METFORMIN HCL 500 MG PO TABS: 500.0000 mg | ORAL_TABLET | Freq: Every day | ORAL | 3 refills | Status: DC

## 2020-01-08 MED FILL — METFORMIN HCL 500 MG TABS: 500 | 30 days supply | Qty: 30 | Fill #0

## 2020-01-08 MED FILL — LOSARTAN POTASSIUM 50 MG TA: 50 | 30 days supply | Qty: 30 | Fill #0

## 2020-01-08 MED FILL — JARDIANCE 10 MG TABLET: 10 | 14 days supply | Qty: 14 | Fill #0

## 2020-01-08 MED FILL — ESCITALOPRAM 10 MG TABLET: 10 | 30 days supply | Qty: 30 | Fill #0

## 2020-01-08 NOTE — Patient Instructions (Addendum)
Diabetes Mellitus and Exercise Exercising regularly is important for your overall health, especially when you have diabetes (diabetes mellitus). Exercising is not only about losing weight. It has many other health benefits, such as increasing muscle strength and bone density and reducing body fat and stress. This leads to improved fitness, flexibility, and endurance, all of which result in better overall health. Exercise has additional benefits for people with diabetes, including:  Reducing appetite.  Helping to lower and control blood glucose.  Lowering blood pressure.  Helping to control amounts of fatty substances (lipids) in the blood, such as cholesterol and triglycerides.  Helping the body to respond better to insulin (improving insulin sensitivity).  Reducing how much insulin the body needs.  Decreasing the risk for heart disease by: ? Lowering cholesterol and triglyceride levels. ? Increasing the levels of good cholesterol. ? Lowering blood glucose levels. What is my activity plan? Your health care provider or certified diabetes educator can help you make a plan for the type and frequency of exercise (activity plan) that works for you. Make sure that you:  Do at least 150 minutes of moderate-intensity or vigorous-intensity exercise each week. This could be brisk walking, biking, or water aerobics. ? Do stretching and strength exercises, such as yoga or weightlifting, at least 2 times a week. ? Spread out your activity over at least 3 days of the week.  Get some form of physical activity every day. ? Do not go more than 2 days in a row without some kind of physical activity. ? Avoid being inactive for more than 30 minutes at a time. Take frequent breaks to walk or stretch.  Choose a type of exercise or activity that you enjoy, and set realistic goals.  Start slowly, and gradually increase the intensity of your exercise over time. What do I need to know about managing my  diabetes?   Check your blood glucose before and after exercising. ? If your blood glucose is 240 mg/dL (13.3 mmol/L) or higher before you exercise, check your urine for ketones. If you have ketones in your urine, do not exercise until your blood glucose returns to normal. ? If your blood glucose is 100 mg/dL (5.6 mmol/L) or lower, eat a snack containing 15-20 grams of carbohydrate. Check your blood glucose 15 minutes after the snack to make sure that your level is above 100 mg/dL (5.6 mmol/L) before you start your exercise.  Know the symptoms of low blood glucose (hypoglycemia) and how to treat it. Your risk for hypoglycemia increases during and after exercise. Common symptoms of hypoglycemia can include: ? Hunger. ? Anxiety. ? Sweating and feeling clammy. ? Confusion. ? Dizziness or feeling light-headed. ? Increased heart rate or palpitations. ? Blurry vision. ? Tingling or numbness around the mouth, lips, or tongue. ? Tremors or shakes. ? Irritability.  Keep a rapid-acting carbohydrate snack available before, during, and after exercise to help prevent or treat hypoglycemia.  Avoid injecting insulin into areas of the body that are going to be exercised. For example, avoid injecting insulin into: ? The arms, when playing tennis. ? The legs, when jogging.  Keep records of your exercise habits. Doing this can help you and your health care provider adjust your diabetes management plan as needed. Write down: ? Food that you eat before and after you exercise. ? Blood glucose levels before and after you exercise. ? The type and amount of exercise you have done. ? When your insulin is expected to peak, if you use  insulin. Avoid exercising at times when your insulin is peaking.  When you start a new exercise or activity, work with your health care provider to make sure the activity is safe for you, and to adjust your insulin, medicines, or food intake as needed.  Drink plenty of water while  you exercise to prevent dehydration or heat stroke. Drink enough fluid to keep your urine clear or pale yellow. Summary  Exercising regularly is important for your overall health, especially when you have diabetes (diabetes mellitus).  Exercising has many health benefits, such as increasing muscle strength and bone density and reducing body fat and stress.  Your health care provider or certified diabetes educator can help you make a plan for the type and frequency of exercise (activity plan) that works for you.  When you start a new exercise or activity, work with your health care provider to make sure the activity is safe for you, and to adjust your insulin, medicines, or food intake as needed. This information is not intended to replace advice given to you by your health care provider. Make sure you discuss any questions you have with your health care provider. Document Revised: 12/24/2016 Document Reviewed: 11/11/2015 Elsevier Patient Education  2020 Elsevier Inc.  Hidradenitis Suppurativa Hidradenitis suppurativa is a long-term (chronic) skin disease. It is similar to a severe form of acne, but it affects areas of the body where acne would be unusual, especially areas of the body where skin rubs against skin and becomes moist. These include:  Underarms.  Groin.  Genital area.  Buttocks.  Upper thighs.  Breasts. Hidradenitis suppurativa may start out as small lumps or pimples caused by blocked sweat glands or hair follicles. Pimples may develop into deep sores that break open (rupture) and drain pus. Over time, affected areas of skin may thicken and become scarred. This condition is rare and does not spread from person to person (non-contagious). What are the causes? The exact cause of this condition is not known. It may be related to:  Female and female hormones.  An overactive disease-fighting system (immune system). The immune system may over-react to blocked hair follicles or  sweat glands and cause swelling and pus-filled sores. What increases the risk? You are more likely to develop this condition if you:  Are female.  Are 18-12 years old.  Have a family history of hidradenitis suppurativa.  Have a personal history of acne.  Are overweight.  Smoke.  Take the medicine lithium. What are the signs or symptoms? The first symptoms are usually painful bumps in the skin, similar to pimples. The condition may get worse over time (progress), or it may only cause mild symptoms. If the disease progresses, symptoms may include:  Skin bumps getting bigger and growing deeper into the skin.  Bumps rupturing and draining pus.  Itchy, infected skin.  Skin getting thicker and scarred.  Tunnels under the skin (fistulas) where pus drains from a bump.  Pain during daily activities, such as pain during walking if your groin area is affected.  Emotional problems, such as stress or depression. This condition may affect your appearance and your ability or willingness to wear certain clothes or do certain activities. How is this diagnosed? This condition is diagnosed by a health care provider who specializes in skin diseases (dermatologist). You may be diagnosed based on:  Your symptoms and medical history.  A physical exam.  Testing a pus sample for infection.  Blood tests. How is this treated? Your treatment will  depend on how severe your symptoms are. The same treatment will not work for everybody with this condition. You may need to try several treatments to find what works best for you. Treatment may include:  Cleaning and bandaging (dressing) your wounds as needed.  Lifestyle changes, such as new skin care routines.  Taking medicines, such as: ? Antibiotics. ? Acne medicines. ? Medicines to reduce the activity of the immune system. ? A diabetes medicine (metformin). ? Birth control pills, for women. ? Steroids to reduce swelling and pain.  Working  with a mental health care provider, if you experience emotional distress due to this condition. If you have severe symptoms that do not get better with medicine, you may need surgery. Surgery may involve:  Using a laser to clear the skin and remove hair follicles.  Opening and draining deep sores.  Removing the areas of skin that are diseased and scarred. Follow these instructions at home: Medicines   Take over-the-counter and prescription medicines only as told by your health care provider.  If you were prescribed an antibiotic medicine, take it as told by your health care provider. Do not stop taking the antibiotic even if your condition improves. Skin care  If you have open wounds, cover them with a clean dressing as told by your health care provider. Keep wounds clean by washing them gently with soap and water when you bathe.  Do not shave the areas where you get hidradenitis suppurativa.  Do not wear deodorant.  Wear loose-fitting clothes.  Try to avoid getting overheated or sweaty. If you get sweaty or wet, change into clean, dry clothes as soon as you can.  To help relieve pain and itchiness, cover sore areas with a warm, clean washcloth (warm compress) for 5-10 minutes as often as needed.  If told by your health care provider, take a bleach bath twice a week: ? Fill your bathtub halfway with water. ? Pour in  cup of unscented household bleach. ? Soak in the tub for 5-10 minutes. ? Only soak from the neck down. Avoid water on your face and hair. ? Shower to rinse off the bleach from your skin. General instructions  Learn as much as you can about your disease so that you have an active role in your treatment. Work closely with your health care provider to find treatments that work for you.  If you are overweight, work with your health care provider to lose weight as recommended.  Do not use any products that contain nicotine or tobacco, such as cigarettes and  e-cigarettes. If you need help quitting, ask your health care provider.  If you struggle with living with this condition, talk with your health care provider or work with a mental health care provider as recommended.  Keep all follow-up visits as told by your health care provider. This is important. Where to find more information  Hidradenitis Suppurativa Foundation, Inc.: https://www.hs-foundation.org/ Contact a health care provider if you have:  A flare-up of hidradenitis suppurativa.  A fever or chills.  Trouble controlling your symptoms at home.  Trouble doing your daily activities because of your symptoms.  Trouble dealing with emotional problems related to your condition. Summary  Hidradenitis suppurativa is a long-term (chronic) skin disease. It is similar to a severe form of acne, but it affects areas of the body where acne would be unusual.  The first symptoms are usually painful bumps in the skin, similar to pimples. The condition may get worse over time (  progress), or it may only cause mild symptoms.  If you have open wounds, cover them with a clean dressing as told by your health care provider. Keep wounds clean by washing them gently with soap and water when you bathe.  Besides skin care, treatment may include medicines, laser treatment, and surgery. This information is not intended to replace advice given to you by your health care provider. Make sure you discuss any questions you have with your health care provider. Document Revised: 06/09/2017 Document Reviewed: 06/09/2017 Elsevier Patient Education  2020 [employer].

## 2020-01-08 NOTE — Progress Notes (Signed)
Subjective:  Patient ID: Nicole Barry, female    DOB: Oct 02, 1955  Age: 64 y.o. MRN: [id number]  CC: No chief complaint on file.   HPI Nicole Barry is a 64 year old female who presents today to establish care at the clinic.  Medical history is  significant for hypertension, type 2 diabetes mellitus, hyperlipidemia, NSTEMI after she had presented to med [facility] with a troponin of 1182 and transferred to [facility]. She underwent cardiac cath which revealed 99% occlusion of proximal LAD which was treated with a drug-eluting stent with residual 65% stenosis in D1 and 30% stenosis and mild LAD. She was placed on dual antiplatelet therapy with Brilinta and aspirin and her other medications optimized Echocardiogram revealed EF of 30 to 35%, moderately decreased left ventricular function, left ventricular regional wall motion abnormalities, severe akinesis of the left ventricular mid apical anteroseptal wall. LVH, grade II DD.  She was seen by cardiology for follow-up posthospitalization on 12/14/2019 and isosorbide was discontinued due to headache and she was placed on Metoprolol.  She has been on Metformin 500mg  daily as she had to cut back due to diarrhea. She denies presence of hypoglycemia, numbness in extremities or visual concerns. Her A1c is 8.4. She complains of breaking out in both intermittently in her right axilla and in her groin Past Medical History:  Diagnosis Date  . Anxiety   . CAD (coronary artery disease)   . Depression   . DM (diabetes mellitus) (HCC)   . HTN (hypertension)   . Hyperlipidemia LDL goal <70   . Hypertension   . NSTEMI (non-ST elevated myocardial infarction) (HCC)    12/05/19: DES-LAD    Past Surgical History:  Procedure Laterality Date  . BREAST SURGERY    . CESAREAN SECTION    . CORONARY STENT INTERVENTION N/A 12/05/2019   Procedure: CORONARY STENT INTERVENTION;  Surgeon: 12/07/2019, MD;  Location: [facility] INVASIVE CV LAB;  Service:  Cardiovascular;  Laterality: N/A;  . LEFT HEART CATH AND CORONARY ANGIOGRAPHY N/A 12/05/2019   Procedure: LEFT HEART CATH AND CORONARY ANGIOGRAPHY;  Surgeon: 12/07/2019, MD;  Location: [facility] INVASIVE CV LAB;  Service: Cardiovascular;  Laterality: N/A;  . rotator cull surg     lt shoulder    Family History  Problem Relation Age of Onset  . Colon cancer Neg Hx     Allergies  Allergen Reactions  . Sulfa Antibiotics Rash    Outpatient Medications Prior to Visit  Medication Sig Dispense Refill  . albuterol (VENTOLIN HFA) 108 (90 Base) MCG/ACT inhaler Inhale 2 puffs into the lungs every 6 (six) hours as needed for wheezing or shortness of breath.    Kathleene Hazel aspirin EC 81 MG EC tablet Take 1 tablet (81 mg total) by mouth daily. Swallow whole. 30 tablet 11  . atorvastatin (LIPITOR) 80 MG tablet Take 1 tablet (80 mg total) by mouth daily. 30 tablet 3  . escitalopram (LEXAPRO) 10 MG tablet Take 10 mg by mouth daily.    . fluticasone (FLONASE) 50 MCG/ACT nasal spray Place 1 spray into both nostrils daily.    . furosemide (LASIX) 20 MG tablet Take 1 tablet (20 mg total) by mouth daily. 30 tablet 3  . isosorbide mononitrate (IMDUR) 30 MG 24 hr tablet Take 1 tablet (30 mg total) by mouth daily. 90 tablet 3  . loratadine (CLARITIN) 10 MG tablet Take 10 mg by mouth daily.    Marland Kitchen losartan (COZAAR) 25 MG tablet Take 1  tablet (25 mg total) by mouth daily. 30 tablet 3  . metFORMIN (GLUCOPHAGE) 500 MG tablet Take 1 tablet (500 mg total) by mouth 2 (two) times daily with a meal. 180 tablet 3  . metoprolol succinate (TOPROL XL) 25 MG 24 hr tablet Take 1 tablet (25 mg total) by mouth daily. 30 tablet 3  . nitroGLYCERIN (NITROSTAT) 0.4 MG SL tablet Place 1 tablet (0.4 mg total) under the tongue every 5 (five) minutes as needed for chest pain. 25 tablet 3  . pantoprazole (PROTONIX) 40 MG tablet Take 1 tablet (40 mg total) by mouth daily. 90 tablet 3  . ticagrelor (BRILINTA) 90 MG TABS tablet Take 1 tablet (90  mg total) by mouth 2 (two) times daily. 180 tablet 3   No facility-administered medications prior to visit.     ROS Review of Systems  Constitutional: Negative for activity change, appetite change and fatigue.  HENT: Negative for congestion, sinus pressure and sore throat.   Eyes: Negative for visual disturbance.  Respiratory: Negative for cough, chest tightness, shortness of breath and wheezing.   Cardiovascular: Negative for chest pain and palpitations.  Gastrointestinal: Negative for abdominal distention, abdominal pain and constipation.  Endocrine: Negative for polydipsia.  Genitourinary: Negative for dysuria and frequency.  Musculoskeletal: Negative for arthralgias and back pain.  Skin: Positive for rash.  Neurological: Negative for tremors, light-headedness and numbness.  Hematological: Does not bruise/bleed easily.  Psychiatric/Behavioral: Negative for agitation and behavioral problems.    Objective:  There were no vitals taken for this visit.  BP/Weight 12/14/2019 12/07/2019 05/21/2014  Systolic BP 146 138 129  Diastolic BP 90 71 66  Wt. (Lbs) 194.4 197.2 -  BMI 42.07 41.21 -      Physical Exam Constitutional:      Appearance: She is well-developed.  Neck:     Vascular: No JVD.  Cardiovascular:     Rate and Rhythm: Normal rate.     Heart sounds: Normal heart sounds. No murmur heard.   Pulmonary:     Effort: Pulmonary effort is normal.     Breath sounds: Normal breath sounds. No wheezing or rales.  Chest:     Chest wall: No tenderness.  Abdominal:     General: Bowel sounds are normal. There is no distension.     Palpations: Abdomen is soft. There is no mass.     Tenderness: There is no abdominal tenderness.  Musculoskeletal:        General: Normal range of motion.     Right lower leg: No edema.     Left lower leg: No edema.  Skin:    Comments: Healed scar in the right axilla  Neurological:     Mental Status: She is alert and oriented to person, place,  and time.  Psychiatric:        Mood and Affect: Mood normal.     CMP Latest Ref Rng & Units 12/07/2019 12/06/2019 12/05/2019  Glucose 70 - 99 mg/dL 387(F) 643(P) 295(J)  BUN 8 - 23 mg/dL 7(L) 7(L) 12  Creatinine 0.44 - 1.00 mg/dL 8.84 1.66 0.63  Sodium 135 - 145 mmol/L 139 139 135  Potassium 3.5 - 5.1 mmol/L 4.5 4.1 3.9  Chloride 98 - 111 mmol/L 100 101 100  CO2 22 - 32 mmol/L 29 28 26   Calcium 8.9 - 10.3 mg/dL 8.9 ) 9.0  Total Protein 6.5 - 8.1 g/dL - - 6.5  Total Bilirubin 0.3 - 1.2 mg/dL - - 0.5  Alkaline Phos 38 -  126 U/L - - 54  AST 15 - 41 U/L - - 25  ALT 0 - 44 U/L - - 26    Lipid Panel     Component Value Date/Time   CHOL 267 (H) 12/06/2019 0310   TRIG 319 (H) 12/06/2019 0310   HDL 43 12/06/2019 0310   CHOLHDL 6.2 12/06/2019 0310   VLDL 64 (H) 12/06/2019 0310   LDLCALC 160 (H) 12/06/2019 0310    CBC    Component Value Date/Time   WBC 10.2 12/07/2019 0434   RBC 4.68 12/07/2019 0434   HGB 14.3 12/07/2019 0434   HCT 42.2 12/07/2019 0434   PLT 313 12/07/2019 0434   MCV 90.2 12/07/2019 0434   MCH 30.6 12/07/2019 0434   MCHC 33.9 12/07/2019 0434   RDW 11.9 12/07/2019 0434   LYMPHSABS 1.5 12/05/2019 1550   MONOABS 0.5 12/05/2019 1550   EOSABS 0.1 12/05/2019 1550   BASOSABS 0.0 12/05/2019 1550    Lab Results  Component Value Date   HGBA1C 8.4 (H) 12/06/2019    Assessment & Plan:   1. Type 2 diabetes mellitus with hyperosmolarity without coma, without long-term current use of insulin (HCC) Uncontrolled with A1c of 8.4; goal is less than 8.0 Intolerance of Metformin due to GI side effects hence we will keep her 500 mg daily Jardiance added to regimen Counseled on blood pressure goal of less than 130/80, low-sodium, DASH diet, medication compliance, 150 minutes of moderate intensity exercise per week. Discussed medication compliance, adverse effects. - Glucose (CBG) - Microalbumin/Creatinine Ratio, Urine - empagliflozin (JARDIANCE) 10 MG TABS tablet;  Take 1 tablet (10 mg total) by mouth daily before breakfast.  Dispense: 30 tablet; Refill: 3 - metFORMIN (GLUCOPHAGE) 500 MG tablet; Take 1 tablet (500 mg total) by mouth daily with breakfast.  Dispense: 60 tablet; Refill: 3 - Basic Metabolic Panel  2. Need for hepatitis C screening test - Hepatitis C Antibody  3. Colon cancer screening - Fecal occult blood, imunochemical([facility])  4. Encounter for screening for HIV - HIV antibody (with reflex)  5. Essential hypertension Uncontrolled Losartan dose increased Counseled on blood pressure goal of less than 130/80, low-sodium, DASH diet, medication compliance, 150 minutes of moderate intensity exercise per week. Discussed medication compliance, adverse effects. - losartan (COZAAR) 50 MG tablet; Take 1 tablet (50 mg total) by mouth daily.  Dispense: 30 tablet; Refill: 3  6. Hidradenitis Advised to apply warm compress Use bleach baths - CBC with Differential/Platelet  7. NSTEMI (non-ST elevated myocardial infarction) [facility]) S/p DES to LAD Risk factor modification Continue dual antiplatelet therapy for 1 year  8. Anxiety and depression Stable - escitalopram (LEXAPRO) 10 MG tablet; Take 1 tablet (10 mg total) by mouth daily.  Dispense: 30 tablet; Refill: 3    Hoy Register, MD, FAAFP. [facility], [place] [phone]   01/08/2020, 9:19 AM

## 2020-01-09 LAB — CBC WITH DIFFERENTIAL/PLATELET
Basophils Absolute: 0 10*3/uL (ref 0.0–0.2)
Basos: 1 %
EOS (ABSOLUTE): 0.3 10*3/uL (ref 0.0–0.4)
Eos: 4 %
Hematocrit: 41.3 % (ref 34.0–46.6)
Hemoglobin: 13.7 g/dL (ref 11.1–15.9)
Immature Grans (Abs): 0 10*3/uL (ref 0.0–0.1)
Immature Granulocytes: 0 %
Lymphocytes Absolute: 1.5 10*3/uL (ref 0.7–3.1)
Lymphs: 24 %
MCH: 30.6 pg (ref 26.6–33.0)
MCHC: 33.2 g/dL (ref 31.5–35.7)
MCV: 92 fL (ref 79–97)
Monocytes Absolute: 0.5 10*3/uL (ref 0.1–0.9)
Monocytes: 8 %
Neutrophils Absolute: 4.1 10*3/uL (ref 1.4–7.0)
Neutrophils: 63 %
Platelets: 394 10*3/uL (ref 150–450)
RBC: 4.47 x10E6/uL (ref 3.77–5.28)
RDW: 12.5 % (ref 11.7–15.4)
WBC: 6.4 10*3/uL (ref 3.4–10.8)

## 2020-01-09 LAB — BASIC METABOLIC PANEL
BUN/Creatinine Ratio: 27 (ref 12–28)
BUN: 17 mg/dL (ref 8–27)
CO2: 24 mmol/L (ref 20–29)
Calcium: 10 mg/dL (ref 8.7–10.3)
Chloride: 103 mmol/L (ref 96–106)
Creatinine, Ser: 0.63 mg/dL (ref 0.57–1.00)
GFR calc Af Amer: 110 mL/min/{1.73_m2} (ref >59)
GFR calc non Af Amer: 96 mL/min/{1.73_m2} (ref >59)
Glucose: 142 mg/dL — ABNORMAL HIGH (ref 65–99)
Potassium: 3.6 mmol/L (ref 3.5–5.2)
Sodium: 142 mmol/L (ref 134–144)

## 2020-01-09 LAB — HIV ANTIBODY (ROUTINE TESTING W REFLEX): HIV Screen 4th Generation wRfx: NONREACTIVE

## 2020-01-09 LAB — FECAL OCCULT BLOOD, IMMUNOCHEMICAL: Fecal Occult Bld: NEGATIVE

## 2020-01-09 LAB — MICROALBUMIN / CREATININE URINE RATIO
Creatinine, Urine: 7.1 mg/dL
Microalbumin, Urine: <3 ug/mL

## 2020-01-09 LAB — HEPATITIS C ANTIBODY: Hep C Virus Ab: <0.1 s/co ratio (ref 0.0–0.9)

## 2020-01-11 ENCOUNTER — Other Ambulatory Visit: Payer: Self-pay

## 2020-01-11 ENCOUNTER — Other Ambulatory Visit: Payer: Self-pay | Admitting: Cardiology

## 2020-01-11 ENCOUNTER — Telehealth (HOSPITAL_COMMUNITY): Payer: Self-pay | Admitting: Pharmacist

## 2020-01-11 ENCOUNTER — Ambulatory Visit (INDEPENDENT_AMBULATORY_CARE_PROVIDER_SITE_OTHER): Payer: Self-pay | Admitting: Cardiology

## 2020-01-11 ENCOUNTER — Telehealth: Payer: Self-pay

## 2020-01-11 ENCOUNTER — Encounter: Payer: Self-pay | Admitting: Cardiology

## 2020-01-11 DIAGNOSIS — Z9861 Coronary angioplasty status: Secondary | ICD-10-CM

## 2020-01-11 DIAGNOSIS — I2510 Atherosclerotic heart disease of native coronary artery without angina pectoris: Secondary | ICD-10-CM

## 2020-01-11 DIAGNOSIS — I214 Non-ST elevation (NSTEMI) myocardial infarction: Secondary | ICD-10-CM

## 2020-01-11 DIAGNOSIS — I10 Essential (primary) hypertension: Secondary | ICD-10-CM

## 2020-01-11 DIAGNOSIS — E119 Type 2 diabetes mellitus without complications: Secondary | ICD-10-CM

## 2020-01-11 DIAGNOSIS — I255 Ischemic cardiomyopathy: Secondary | ICD-10-CM

## 2020-01-11 DIAGNOSIS — E785 Hyperlipidemia, unspecified: Secondary | ICD-10-CM

## 2020-01-11 DIAGNOSIS — E669 Obesity, unspecified: Secondary | ICD-10-CM

## 2020-01-11 MED ORDER — CLOPIDOGREL BISULFATE 300 MG PO TABS: 300.0000 mg | ORAL_TABLET | Freq: Once | ORAL | 0 refills | Status: AC

## 2020-01-11 MED ORDER — CLOPIDOGREL BISULFATE 75 MG PO TABS: 75.0000 mg | ORAL_TABLET | Freq: Every day | ORAL | 1 refills | Status: DC

## 2020-01-11 MED FILL — CLOPIDOGREL 75 MG TABLET: 75 | 30 days supply | Qty: 30 | Fill #0

## 2020-01-11 NOTE — Assessment & Plan Note (Signed)
EF 30-35% at cath and by echo 12/06/2019

## 2020-01-11 NOTE — Telephone Encounter (Signed)
Patient name and DOB has been verified Patient was informed of lab results. Patient had no questions.  

## 2020-01-11 NOTE — Patient Instructions (Signed)
Medication Instructions:   STOP Brilinta  START Plavix 75 mg (when you run out of Brilinta)---take 300 mg the first day; then take 1 tablet daily thereafter.  *If you need a refill on your cardiac medications before your next appointment, please call your pharmacy*   Lab Work: Your physician recommends that you return for a FASTING lipid profile and CMET---in October prior to your appointment with Dr. Rennis Golden.  If you have labs (blood work) drawn today and your tests are completely normal, you will receive your results only by: Marland Kitchen MyChart Message (if you have MyChart) OR . A paper copy in the mail If you have any lab test that is abnormal or we need to change your treatment, we will call you to review the results.   Testing/Procedures: Your physician has requested that you have an echocardiogram in October--prior to your appointment with Dr. Rennis Golden. Echocardiography is a painless test that uses sound waves to create images of your heart. It provides your doctor with information about the size and shape of your heart and how well your heart's chambers and valves are working. This procedure takes approximately one hour. There are no restrictions for this procedure.   Follow-Up: At [facility], you and your health needs are our priority.  As part of our continuing mission to provide you with exceptional heart care, we have created designated Provider Care Teams.  These Care Teams include your primary Cardiologist (physician) and Advanced Practice Providers (APPs -  Physician Assistants and Nurse Practitioners) who all work together to provide you with the care you need, when you need it.  We recommend signing up for the patient portal called "MyChart".  Sign up information is provided on this After Visit Summary.  MyChart is used to connect with patients for Virtual Visits (Telemedicine).  Patients are able to view lab/test results, encounter notes, upcoming appointments, etc.  Non-urgent  messages can be sent to your provider as well.   To learn more about what you can do with MyChart, go to [email or web address].    Your next appointment:   October 12 at 1:30 PM  The format for your next appointment:   In Person  Provider:   K. Italy Hilty, MD

## 2020-01-11 NOTE — Progress Notes (Signed)
Cardiology Office Note:    Date:  01/11/2020   ID:  Nicole Barry, DOB 27-Jul-1955, MRN [id number]  PCP:  Hoy Register, MD  Cardiologist:  Chrystie Nose, MD  Electrophysiologist:  None   Referring MD: No ref. provider found   No chief complaint on file.   History of Present Illness:    Nicole Barry is a pleasant, obese, Caucasian 64 y.o. female with a hx of hypertension and dyslipidemia.  She had been working for [employer] [place] in [employer] department but was laid off over a year ago.  Prior to this she was on lisinopril HCTZ and rosuvastatin.  Patient been off her medications for more than a year when she presented 12/05/2019 with a NSTEMI.  She was taken to the Cath Lab where she had a 95% proximal LAD stenosis.  This was dilated and stented with a DES.  She had a residual 65% small diagonal narrowing and 30% mid distal LAD narrowing.  Her ejection fraction was 30 to 35% at cath and by echocardiogram.  Patient was placed on medications and is seen now in follow-up.  She has been unable to get Medicaid.  Basically all her medications are going to be self-pay.  Fortunately she is done well since last seen.  She says she will not be able to afford Brilinta long-term which is over $400 a month.  She has some dyspnea on exertion, especially when using her arms.  She denies any orthopnea or lower extremity edema.  She has a blood pressure cuff at home but her readings tend to run 10 to 20 mm higher than they do with a arm cuff.  Past Medical History:  Diagnosis Date  . Acute diverticulitis 05/17/2014  . Anxiety   . CAD (coronary artery disease)   . Depression   . Diverticulitis 05/17/2014  . HTN (hypertension)   . Hyperlipidemia LDL goal <70   . Non-ST elevation (NSTEMI) myocardial infarction (HCC) 12/05/2019  . Normocytic anemia 05/19/2014  . NSTEMI (non-ST elevated myocardial infarction) (HCC)    12/05/19: DES-LAD  . Sepsis (HCC) 05/19/2014  . Type 2 diabetes mellitus with hyperosmolarity  without coma, without long-term current use of insulin (HCC)   . Unstable angina (HCC) 12/05/2019    Past Surgical History:  Procedure Laterality Date  . BREAST SURGERY    . CESAREAN SECTION    . CORONARY STENT INTERVENTION N/A 12/05/2019   Procedure: CORONARY STENT INTERVENTION;  Surgeon: Kathleene Hazel, MD;  Location: [facility] INVASIVE CV LAB;  Service: Cardiovascular;  Laterality: N/A;  . LEFT HEART CATH AND CORONARY ANGIOGRAPHY N/A 12/05/2019   Procedure: LEFT HEART CATH AND CORONARY ANGIOGRAPHY;  Surgeon: Kathleene Hazel, MD;  Location: [facility] INVASIVE CV LAB;  Service: Cardiovascular;  Laterality: N/A;  . rotator cull surg     lt shoulder    Current Medications: Current Meds  Medication Sig  . albuterol (VENTOLIN HFA) 108 (90 Base) MCG/ACT inhaler Inhale 2 puffs into the lungs every 6 (six) hours as needed for wheezing or shortness of breath.  Marland Kitchen aspirin EC 81 MG EC tablet Take 1 tablet (81 mg total) by mouth daily. Swallow whole.  Marland Kitchen atorvastatin (LIPITOR) 80 MG tablet Take 1 tablet (80 mg total) by mouth daily.  . empagliflozin (JARDIANCE) 10 MG TABS tablet Take 1 tablet (10 mg total) by mouth daily before breakfast.  . escitalopram (LEXAPRO) 10 MG tablet Take 1 tablet (10 mg total) by mouth daily.  . fluticasone (FLONASE)  50 MCG/ACT nasal spray Place 1 spray into both nostrils daily.  . furosemide (LASIX) 20 MG tablet Take 1 tablet (20 mg total) by mouth daily.  Marland Kitchen loratadine (CLARITIN) 10 MG tablet Take 10 mg by mouth daily.  Marland Kitchen losartan (COZAAR) 50 MG tablet Take 1 tablet (50 mg total) by mouth daily.  . metFORMIN (GLUCOPHAGE) 500 MG tablet Take 1 tablet (500 mg total) by mouth daily with breakfast.  . metoprolol succinate (TOPROL XL) 25 MG 24 hr tablet Take 1 tablet (25 mg total) by mouth daily.  . nitroGLYCERIN (NITROSTAT) 0.4 MG SL tablet Place 1 tablet (0.4 mg total) under the tongue every 5 (five) minutes as needed for chest pain.  . pantoprazole (PROTONIX) 40 MG tablet  Take 1 tablet (40 mg total) by mouth daily.  . [DISCONTINUED] ticagrelor (BRILINTA) 90 MG TABS tablet Take 1 tablet (90 mg total) by mouth 2 (two) times daily.     Allergies:   Sulfa antibiotics   Social History   Socioeconomic History  . Marital status: Legally Separated    Spouse name: Not on file  . Number of children: Not on file  . Years of education: Not on file  . Highest education level: Not on file  Occupational History  . Not on file  Tobacco Use  . Smoking status: Never Smoker  . Smokeless tobacco: Never Used  Substance and Sexual Activity  . Alcohol use: Yes    Comment: occasional  . Drug use: No  . Sexual activity: Yes  Other Topics Concern  . Not on file  Social History Narrative  . Not on file   Social Determinants of Health   Financial Resource Strain:   . Difficulty of Paying Living Expenses:   Food Insecurity:   . Worried About Programme researcher, broadcasting/film/video in the Last Year:   . Barista in the Last Year:   Transportation Needs:   . Freight forwarder (Medical):   Marland Kitchen Lack of Transportation (Non-Medical):   Physical Activity:   . Days of Exercise per Week:   . Minutes of Exercise per Session:   Stress:   . Feeling of Stress :   Social Connections:   . Frequency of Communication with Friends and Family:   . Frequency of Social Gatherings with Friends and Family:   . Attends Religious Services:   . Active Member of Clubs or Organizations:   . Attends Banker Meetings:   Marland Kitchen Marital Status:      Family History: The patient's family history includes Diabetes in her sister; Heart attack in her brother and father; Hypertension in her sister. There is no history of Colon cancer.  ROS:   Please see the history of present illness.     All other systems reviewed and are negative.  EKGs/Labs/Other Studies Reviewed:    The following studies were reviewed today: Cath/PCI 12/05/2019 Echo 12/06/2019- IMPRESSIONS    1. Left ventricular  ejection fraction, by estimation, is 30 to 35%. The  left ventricle has moderately decreased function. The left ventricle  demonstrates regional wall motion abnormalities (see scoring  diagram/findings for description). There is mild  asymmetric left ventricular hypertrophy. Left ventricular diastolic  parameters are consistent with Grade II diastolic dysfunction  (pseudonormalization). There is severe akinesis of the left ventricular,  mid-apical anteroseptal wall. There is moderate  dyskinesis of the left ventricular, apical apical segment.  2. Right ventricular systolic function is normal. The right ventricular  size is normal.  There is normal pulmonary artery systolic pressure.  3. The mitral valve is normal in structure. Trivial mitral valve  regurgitation.  4. The aortic valve is normal in structure. Aortic valve regurgitation is  not visualized. No aortic stenosis is present.   EKG:  EKG is ordered today.  The ekg ordered today demonstrates NSR, HR 64, anterior lateral TWI  Recent Labs: 12/05/2019: ALT 26 12/06/2019: Magnesium 1.7 01/08/2020: BUN 17; Creatinine, Ser 0.63; Hemoglobin 13.7; Platelets 394; Potassium 3.6; Sodium 142  Recent Lipid Panel    Component Value Date/Time   CHOL 267 (H) 12/06/2019 0310   TRIG 319 (H) 12/06/2019 0310   HDL 43 12/06/2019 0310   CHOLHDL 6.2 12/06/2019 0310   VLDL 64 (H) 12/06/2019 0310   LDLCALC 160 (H) 12/06/2019 0310    Physical Exam:    VS:  BP (!) 130/77   Pulse 64   Temp (!) 93.9 F (34.4 C)   Ht 4\' 10"  (1.473 m)   Wt 191 lb 3.2 oz (86.7 kg)   SpO2 94%   BMI 39.96 kg/m     Wt Readings from Last 3 Encounters:  01/11/20 191 lb 3.2 oz (86.7 kg)  01/08/20 194 lb (88 kg)  12/14/19 194 lb 6.4 oz (88.2 kg)     GEN: Obese caucasian female,well developed in no acute distress HEENT: Normal NECK: No JVD; No carotid bruits CARDIAC: RRR, no murmurs, rubs, gallops RESPIRATORY:  Clear to auscultation without rales, wheezing or  rhonchi  ABDOMEN: Obese, soft, non-tender MUSCULOSKELETAL:  No edema; No deformity  SKIN: Warm and dry NEUROLOGIC:  Alert and oriented x 3 PSYCHIATRIC:  Normal affect   ASSESSMENT:    NSTEMI (non-ST elevated myocardial infarction) [facility]) Admitted 12/05/2019 with NSTEMI-taken for PCI  CAD S/P percutaneous coronary angioplasty S/P LAD PCI with DES 6/22/021, residual Dx disease  Ischemic cardiomyopathy EF 30-35% at cath and by echo 12/06/2019  Hypertension HTN- uncontrolled on admission- had been off medications secondary to loss of job and insurance.  Obesity (BMI 30-39.9) BMI 39, she says she had a negative sleep study in 2015  Hyperlipidemia LDL goal <70 LDL 160 on admission June 2021 (off statin Rx)  Non-insulin dependent type 2 diabetes mellitus (HCC) Diagnosed during her recent hospitalization- Hgb A1c- 8.3 Glucophage and Jardiance added  PLAN:    Same Rx- repeat B/P by me 130/70.  Transition from Brilinta to Plavix-300 mg x one then 75 mg QD. She has a follow up with Dr July 2021 in Oct- check fasting lipids, CMET, and echo before that visit.     Medication Adjustments/Labs and Tests Ordered: Current medicines are reviewed at length with the patient today.  Concerns regarding medicines are outlined above.  Orders Placed This Encounter  Procedures  . Lipid panel  . Comprehensive metabolic panel  . EKG 12-Lead  . ECHOCARDIOGRAM COMPLETE   Meds ordered this encounter  Medications  . clopidogrel (PLAVIX) 300 MG TABS tablet    Sig: Take 1 tablet (300 mg total) by mouth once for 1 dose.    Dispense:  1 tablet    Refill:  0  . clopidogrel (PLAVIX) 75 MG tablet    Sig: Take 1 tablet (75 mg total) by mouth daily.    Dispense:  90 tablet    Refill:  1    Patient Instructions  Medication Instructions:   STOP Brilinta  START Plavix 75 mg (when you run out of Brilinta)---take 300 mg the first day; then take 1 tablet daily thereafter.  *  If you need a refill on your  cardiac medications before your next appointment, please call your pharmacy*   Lab Work: Your physician recommends that you return for a FASTING lipid profile and CMET---in October prior to your appointment with Dr. Rennis GoldenHilty.  If you have labs (blood work) drawn today and your tests are completely normal, you will receive your results only by: Marland Kitchen. MyChart Message (if you have MyChart) OR . A paper copy in the mail If you have any lab test that is abnormal or we need to change your treatment, we will call you to review the results.   Testing/Procedures: Your physician has requested that you have an echocardiogram in October--prior to your appointment with Dr. Rennis GoldenHilty. Echocardiography is a painless test that uses sound waves to create images of your heart. It provides your doctor with information about the size and shape of your heart and how well your heart's chambers and valves are working. This procedure takes approximately one hour. There are no restrictions for this procedure.   Follow-Up: At [facility], you and your health needs are our priority.  As part of our continuing mission to provide you with exceptional heart care, we have created designated Provider Care Teams.  These Care Teams include your primary Cardiologist (physician) and Advanced Practice Providers (APPs -  Physician Assistants and Nurse Practitioners) who all work together to provide you with the care you need, when you need it.  We recommend signing up for the patient portal called "MyChart".  Sign up information is provided on this After Visit Summary.  MyChart is used to connect with patients for Virtual Visits (Telemedicine).  Patients are able to view lab/test results, encounter notes, upcoming appointments, etc.  Non-urgent messages can be sent to your provider as well.   To learn more about what you can do with MyChart, go to [email or web address].    Your next appointment:   October 12 at 1:30 PM  The format for  your next appointment:   In Person  Provider:   K. Italyhad Hilty, MD       Signed, Corine ShelterLuke Kilroy, PA-C  01/11/2020 12:03 PM    [facility]

## 2020-01-11 NOTE — Assessment & Plan Note (Signed)
Admitted 12/05/2019 with NSTEMI-taken for PCI

## 2020-01-11 NOTE — Assessment & Plan Note (Signed)
BMI 39, she says she had a negative sleep study in 2015

## 2020-01-11 NOTE — Assessment & Plan Note (Signed)
Diagnosed during her recent hospitalization- Hgb A1c- 8.3 Glucophage and Jardiance added

## 2020-01-11 NOTE — Assessment & Plan Note (Signed)
LDL 160 on admission June 2021 (off statin Rx)

## 2020-01-11 NOTE — Assessment & Plan Note (Signed)
S/P LAD PCI with DES 6/22/021, residual Dx disease

## 2020-01-11 NOTE — Telephone Encounter (Signed)
-----   Message from Hoy Register, MD sent at 01/10/2020  2:03 PM EDT ----- Stool test for occult blood is negative.  Repeat colon cancer screening recommended in 1 year

## 2020-01-11 NOTE — Assessment & Plan Note (Signed)
HTN- uncontrolled on admission- had been off medications secondary to loss of job and insurance.

## 2020-01-12 ENCOUNTER — Telehealth (HOSPITAL_COMMUNITY): Payer: Self-pay | Admitting: *Deleted

## 2020-01-12 ENCOUNTER — Encounter (HOSPITAL_COMMUNITY)
Admission: RE | Admit: 2020-01-12 | Discharge: 2020-01-12 | Disposition: A | Discharge status: Home / Self Care | Payer: Self-pay | Source: Ambulatory Visit | Attending: Internal Medicine | Admitting: Internal Medicine

## 2020-01-12 NOTE — Telephone Encounter (Signed)
Confirmed orientation appointment. Completed health history.Pt is interested in participating in Virtual Cardiac and Pulmonary Rehab. Pt advised that Virtual Cardiac and Pulmonary Rehab is provided at no cost to the patient.  Checklist:  1. Pt has smart device  ie smartphone and/or ipad for downloading an app  Yes 2. Reliable internet/wifi service    Yes 3. Understands how to use their smartphone and navigate within an app.  Yes   Pt verbalized understanding and is in agreement..           Confirm Consent - In the setting of the current Covid19 crisis, you are scheduled for a phone visit with your Cardiac or Pulmonary team member.  Just as we do with many in-gym visits, in order for you to participate in this visit, we must obtain consent.  If you'd like, I can send this to your mychart (if signed up) or email for you to review.  Otherwise, I can obtain your verbal consent now.  By agreeing to a telephone visit, we'd like you to understand that the technology does not allow for your Cardiac or Pulmonary Rehab team member to perform a physical assessment, and thus may limit their ability to fully assess your ability to perform exercise programs. If your provider identifies any concerns that need to be evaluated in person, we will make arrangements to do so.  Finally, though the technology is pretty good, we cannot assure that it will always work on either your or our end and we cannot ensure that we have a secure connection.  Cardiac and Pulmonary Rehab Telehealth visits and "At Home" cardiac and pulmonary rehab are provided at no cost to you.        Are you willing to proceed?"        STAFF: Did the patient verbally acknowledge consent to telehealth visit? Document YES/NO here: Yes     Gladstone Lighter RN  Cardiac and Pulmonary Rehab Staff        Date 01/12/20    @ Time 1520

## 2020-01-12 NOTE — Telephone Encounter (Signed)
Left message to call cardiac rehab regarding orientation.Gladstone Lighter, RN,BSN 01/12/2020 2:43 PM

## 2020-01-15 ENCOUNTER — Other Ambulatory Visit: Payer: Self-pay | Admitting: Family Medicine

## 2020-01-15 NOTE — Telephone Encounter (Signed)
Medication Refill - Medication: pantoprazole (PROTONIX) 40 MG tablet  Pt is completley out and is experiencing severe heart burn. Please advise   Has the patient contacted their pharmacy? Yes.   (Agent: If no, request that the patient contact the pharmacy for the refill.) (Agent: If yes, when and what did the pharmacy advise?)  Preferred Pharmacy (with phone number or street name):  [facility] - [place], [place] - [address]  [address] [place] [address]  Phone: [phone] Fax: [phone]     Agent: Please be advised that RX refills may take up to 3 business days. We ask that you follow-up with your pharmacy.

## 2020-01-15 NOTE — Telephone Encounter (Signed)
Requested medication (s) are due for refill today: no  Requested medication (s) are on the active medication list: yes  Last refill:  12/05/2019  Future visit scheduled: yes  Notes to clinic:  medication was filled by a different provider Patient is having bad heart and needs script   Requested Prescriptions  Pending Prescriptions Disp Refills   pantoprazole (PROTONIX) 40 MG tablet 90 tablet 3    Sig: Take 1 tablet (40 mg total) by mouth daily.      Gastroenterology: Proton Pump Inhibitors Passed - 01/15/2020 12:05 PM      Passed - Valid encounter within last 12 months    Recent Outpatient Visits           1 week ago Type 2 diabetes mellitus with hyperosmolarity without coma, without long-term current use of insulin (HCC)   [facility] Hoy Register, MD   7 years ago Shoulder capsulitis   [facility], [facility]   7 years ago Meniscus, lateral, derangement   [facility], Tessa Lerner, MD       Future Appointments             In 2 months Hilty, Lisette Abu, MD [facility] [place]   In 2 months Hoy Register, MD [facility]

## 2020-01-16 ENCOUNTER — Other Ambulatory Visit: Payer: Self-pay

## 2020-01-16 ENCOUNTER — Encounter (HOSPITAL_COMMUNITY): Payer: Self-pay

## 2020-01-16 ENCOUNTER — Encounter (HOSPITAL_COMMUNITY)
Admission: RE | Admit: 2020-01-16 | Discharge: 2020-01-16 | Disposition: A | Discharge status: Home / Self Care | Payer: Self-pay | Source: Ambulatory Visit | Attending: Internal Medicine | Admitting: Internal Medicine

## 2020-01-16 VITALS — BP 104/52 | HR 74 | Resp 18 | Ht <= 58 in | Wt 191.1 lb

## 2020-01-16 DIAGNOSIS — Z955 Presence of coronary angioplasty implant and graft: Secondary | ICD-10-CM

## 2020-01-16 DIAGNOSIS — I2510 Atherosclerotic heart disease of native coronary artery without angina pectoris: Secondary | ICD-10-CM

## 2020-01-16 DIAGNOSIS — I214 Non-ST elevation (NSTEMI) myocardial infarction: Secondary | ICD-10-CM

## 2020-01-16 MED FILL — PANTOPRAZOLE SOD DR 40 MG T: 40 | 30 days supply | Qty: 30 | Fill #0

## 2020-01-16 NOTE — Progress Notes (Signed)
Patient plans to participate in the virtual cardiac rehab program via the [employer] app. Reviewed home exercise guidelines with patient including endpoints, temperature precautions, target heart rate and rate of perceived exertion. Pt plans to walk as her mode of home exercise. Pt has bilateral knee problems, which limits walking. Discussed walking 10 minutes three times/day then gradually increasing duration as tolerated until reaching 15 minutes twice/day with a goal of accumulating 30 minutes of aerobic exercise 5-7 days/week, and pt is amenable to this. Pt is also looking into purchasing a stationary bike for home use and also has access to a pool for swimming/ water walking. Pt given orange resistance bands and is also looking into purchasing 2 lb.weights for resistance training exercises. Pt voices understanding of instructions given.  Artist Pais, MS, ACSM CEP

## 2020-01-16 NOTE — Progress Notes (Addendum)
Cardiac Individual Treatment Plan  Patient Details  Name: Nicole Barry MRN: [id number] Date of Birth: 04/17/1956 Referring Provider:     CARDIAC REHAB PHASE II ORIENTATION from 01/16/2020 in [facility] CARDIAC [facility]  Referring Provider Chrystie Nose, MD      Initial Encounter Date:    CARDIAC REHAB PHASE II ORIENTATION from 01/16/2020 in [facility] CARDIAC REHAB  Date 01/16/20      Visit Diagnosis: CAD S/P percutaneous coronary angioplasty  Status post coronary artery stent placement  NSTEMI (non-ST elevated myocardial infarction) (HCC)  Patient's Home Medications on Admission:  Current Outpatient Medications:  .  albuterol (VENTOLIN HFA) 108 (90 Base) MCG/ACT inhaler, Inhale 2 puffs into the lungs every 6 (six) hours as needed for wheezing or shortness of breath., Disp: , Rfl:  .  aspirin EC 81 MG EC tablet, Take 1 tablet (81 mg total) by mouth daily. Swallow whole., Disp: 30 tablet, Rfl: 11 .  atorvastatin (LIPITOR) 80 MG tablet, Take 1 tablet (80 mg total) by mouth daily., Disp: 30 tablet, Rfl: 3 .  clopidogrel (PLAVIX) 75 MG tablet, Take 1 tablet (75 mg total) by mouth daily., Disp: 90 tablet, Rfl: 1 .  empagliflozin (JARDIANCE) 10 MG TABS tablet, Take 1 tablet (10 mg total) by mouth daily before breakfast., Disp: 30 tablet, Rfl: 3 .  escitalopram (LEXAPRO) 10 MG tablet, Take 1 tablet (10 mg total) by mouth daily., Disp: 30 tablet, Rfl: 3 .  fluticasone (FLONASE) 50 MCG/ACT nasal spray, Place 1 spray into both nostrils daily., Disp: , Rfl:  .  furosemide (LASIX) 20 MG tablet, Take 1 tablet (20 mg total) by mouth daily., Disp: 30 tablet, Rfl: 3 .  loratadine (CLARITIN) 10 MG tablet, Take 10 mg by mouth daily., Disp: , Rfl:  .  losartan (COZAAR) 50 MG tablet, Take 1 tablet (50 mg total) by mouth daily., Disp: 30 tablet, Rfl: 3 .  metFORMIN (GLUCOPHAGE) 500 MG tablet, Take 1 tablet (500 mg total) by mouth daily with breakfast., Disp: 60 tablet, Rfl:  3 .  metoprolol succinate (TOPROL XL) 25 MG 24 hr tablet, Take 1 tablet (25 mg total) by mouth daily., Disp: 30 tablet, Rfl: 3 .  nitroGLYCERIN (NITROSTAT) 0.4 MG SL tablet, Place 1 tablet (0.4 mg total) under the tongue every 5 (five) minutes as needed for chest pain., Disp: 25 tablet, Rfl: 3 .  pantoprazole (PROTONIX) 40 MG tablet, Take 1 tablet (40 mg total) by mouth daily., Disp: 90 tablet, Rfl: 3  Past Medical History: Past Medical History:  Diagnosis Date  . Acute diverticulitis 05/17/2014  . Anxiety   . CAD (coronary artery disease)   . Depression   . Diverticulitis 05/17/2014  . HTN (hypertension)   . Hyperlipidemia LDL goal <70   . Non-ST elevation (NSTEMI) myocardial infarction (HCC) 12/05/2019  . Normocytic anemia 05/19/2014  . NSTEMI (non-ST elevated myocardial infarction) (HCC)    12/05/19: DES-LAD  . Sepsis (HCC) 05/19/2014  . Type 2 diabetes mellitus with hyperosmolarity without coma, without long-term current use of insulin (HCC)   . Unstable angina (HCC) 12/05/2019    Tobacco Use: Social History   Tobacco Use  Smoking Status Never Smoker  Smokeless Tobacco Never Used    Labs: Recent Review Advice worker    Labs for ITP Cardiac and Pulmonary Rehab Latest Ref Rng & Units 12/06/2019   Cholestrol 0 - 200 mg/dL 409(W)   LDLCALC 0 - 99 mg/dL 119(J)   HDL >47 mg/dL  43   Trlycerides <150 mg/dL 409(W)   Hemoglobin J1B 4.8 - 5.6 % 8.4(H)      Capillary Blood Glucose: Lab Results  Component Value Date   GLUCAP 238 (H) 12/07/2019   GLUCAP 175 (H) 12/06/2019   GLUCAP 233 (H) 12/06/2019   GLUCAP 140 (H) 05/21/2014   GLUCAP 124 (H) 05/21/2014     Exercise Target Goals: Exercise Program Goal: Individual exercise prescription set using results from initial 6 min walk test and THRR while considering  patient's activity barriers and safety.   Exercise Prescription Goal: Initial exercise prescription builds to 30-45 minutes a day of aerobic activity, 2-3 days per  week.  Home exercise guidelines will be given to patient during program as part of exercise prescription that the participant will acknowledge.  Activity Barriers & Risk Stratification:  Activity Barriers & Cardiac Risk Stratification - 01/16/20 1034      Activity Barriers & Cardiac Risk Stratification   Activity Barriers History of Falls;Balance Concerns;Other (comment);Joint Problems    Comments Bilateral laproscopic knee surgery. Per patient needs bilateral knee replacement. Left rotator cuff surgery. Bilateral carpal tunnel surgery.    Cardiac Risk Stratification High           6 Minute Walk:  6 Minute Walk    Row Name 01/16/20 1118         6 Minute Walk   Phase Initial     Distance 1400 feet     Walk Time 6 minutes     # of Rest Breaks 0     MPH 2.65     METS 3.07     RPE 12     Perceived Dyspnea  0     VO2 Peak 10.75     Symptoms No     Resting HR 74 bpm     Resting BP 104/52     Resting Oxygen Saturation  98 %     Exercise Oxygen Saturation  during 6 min walk 100 %     Max Ex. HR 115 bpm     Max Ex. BP 158/52     2 Minute Post BP 122/70            Oxygen Initial Assessment:   Oxygen Re-Evaluation:   Oxygen Discharge (Final Oxygen Re-Evaluation):   Initial Exercise Prescription:  Initial Exercise Prescription - 01/16/20 1100      Date of Initial Exercise RX and Referring Provider   Date 01/16/20    Referring Provider Chrystie Nose, MD    Expected Discharge Date 03/15/20      Track   Minutes 30      Prescription Details   Frequency (times per week) 5-7    Duration Progress to 30 minutes of continuous aerobic without signs/symptoms of physical distress      Intensity   THRR 40-80% of Max Heartrate 63-126    Ratings of Perceived Exertion 11-13    Perceived Dyspnea 0-4      Progression   Progression Continue to progress workloads to maintain intensity without signs/symptoms of physical distress.      Resistance Training   Training  Prescription Yes    Weight --   orange bands   Reps 10-15           Perform Capillary Blood Glucose checks as needed.  Exercise Prescription Changes:   Exercise Comments:   Exercise Comments    Row Name 01/16/20 1034           Exercise  Comments Reviewed home exercise prescription with patient.              Exercise Goals and Review:   Exercise Goals    Row Name 01/16/20 1036             Exercise Goals   Increase Physical Activity Yes       Intervention Provide advice, education, support and counseling about physical activity/exercise needs.;Develop an individualized exercise prescription for aerobic and resistive training based on initial evaluation findings, risk stratification, comorbidities and participant's personal goals.       Expected Outcomes Short Term: Attend rehab on a regular basis to increase amount of physical activity.;Long Term: Exercising regularly at least 3-5 days a week.;Long Term: Add in home exercise to make exercise part of routine and to increase amount of physical activity.       Increase Strength and Stamina Yes       Intervention Provide advice, education, support and counseling about physical activity/exercise needs.;Develop an individualized exercise prescription for aerobic and resistive training based on initial evaluation findings, risk stratification, comorbidities and participant's personal goals.       Expected Outcomes Short Term: Increase workloads from initial exercise prescription for resistance, speed, and METs.;Short Term: Perform resistance training exercises routinely during rehab and add in resistance training at home;Long Term: Improve cardiorespiratory fitness, muscular endurance and strength as measured by increased METs and functional capacity ( )       Able to understand and use rate of perceived exertion (RPE) scale Yes       Intervention Provide education and explanation on how to use RPE scale       Expected Outcomes  Short Term: Able to use RPE daily in rehab to express subjective intensity level;Long Term:  Able to use RPE to guide intensity level when exercising independently       Knowledge and understanding of Target Heart Rate Range (THRR) Yes       Intervention Provide education and explanation of THRR including how the numbers were predicted and where they are located for reference       Expected Outcomes Short Term: Able to state/look up THRR;Long Term: Able to use THRR to govern intensity when exercising independently;Short Term: Able to use daily as guideline for intensity in rehab       Able to check pulse independently Yes       Intervention Provide education and demonstration on how to check pulse in carotid and radial arteries.;Review the importance of being able to check your own pulse for safety during independent exercise       Expected Outcomes Short Term: Able to explain why pulse checking is important during independent exercise;Long Term: Able to check pulse independently and accurately       Understanding of Exercise Prescription Yes       Intervention Provide education, explanation, and written materials on patient's individual exercise prescription       Expected Outcomes Short Term: Able to explain program exercise prescription;Long Term: Able to explain home exercise prescription to exercise independently              Exercise Goals Re-Evaluation :   Discharge Exercise Prescription (Final Exercise Prescription Changes):   Nutrition:  Target Goals: Understanding of nutrition guidelines, daily intake of sodium 1500mg , cholesterol 200mg , calories 30% from fat and 7% or less from saturated fats, daily to have 5 or more servings of fruits and vegetables.  Biometrics:  Pre Biometrics - 01/16/20 1107  Pre Biometrics   Waist Circumference 41.75 inches    Hip Circumference 49.25 inches    Waist to Hip Ratio 0.85 %    Triceps Skinfold 36 mm    % Body Fat 51.2 %    Grip  Strength 32.5 kg    Flexibility 15.75 in    Single Leg Stand 24.56 seconds            Nutrition Therapy Plan and Nutrition Goals:   Nutrition Assessments:   Nutrition Goals Re-Evaluation:   Nutrition Goals Re-Evaluation:   Nutrition Goals Discharge (Final Nutrition Goals Re-Evaluation):   Psychosocial: Target Goals: Acknowledge presence or absence of significant depression and/or stress, maximize coping skills, provide positive support system. Participant is able to verbalize types and ability to use techniques and skills needed for reducing stress and depression.  Initial Review & Psychosocial Screening:  Initial Psych Review & Screening - 01/16/20 1202      Initial Review   Current issues with None Identified      Family Dynamics   Good Support System? Yes    Comments Ms. Baxley presents to her cardiac rehab orientation appointment with a positive attitude and outlook. She has a very healthy support system including her sister and a significant other of 2 years. PHQ 9 score 0. Patient states since her cardiac event she is grateful for the opportunity to better her health. No psychosocial interventions needed at this time.      Barriers   Psychosocial barriers to participate in program There are no identifiable barriers or psychosocial needs.      Screening Interventions   Interventions Encouraged to exercise           Quality of Life Scores:  Scores of 19 and below usually indicate a poorer quality of life in these areas.  A difference of  2-3 points is a clinically meaningful difference.  A difference of 2-3 points in the total score of the Quality of Life Index has been associated with significant improvement in overall quality of life, self-image, physical symptoms, and general health in studies assessing change in quality of life.  PHQ-9: Recent Review Flowsheet Data    Depression screen [facility] 2/9 01/16/2020 01/08/2020 01/08/2020   Decreased Interest 0 0 1   Down,  Depressed, Hopeless 0 0 0   PHQ - 2 Score 0 0 1   Altered sleeping 0 0 1   Tired, decreased energy 0 0 1   Change in appetite 0 0 0   Feeling bad or failure about yourself  0 0 0   Trouble concentrating 0 0 0   Moving slowly or fidgety/restless 0 0 0   Suicidal thoughts 0 0 0   PHQ-9 Score 0 0 3     Interpretation of Total Score  Total Score Depression Severity:  1-4 = Minimal depression, 5-9 = Mild depression, 10-14 = Moderate depression, 15-19 = Moderately severe depression, 20-27 = Severe depression   Psychosocial Evaluation and Intervention:   Psychosocial Re-Evaluation:   Psychosocial Discharge (Final Psychosocial Re-Evaluation):   Vocational Rehabilitation: Provide vocational rehab assistance to qualifying candidates.   Vocational Rehab Evaluation & Intervention:  Vocational Rehab - 01/16/20 1205      Initial Vocational Rehab Evaluation & Intervention   Assessment shows need for Vocational Rehabilitation No           Education: Education Goals: Education classes will be provided on a weekly basis, covering required topics. Participant will state understanding/return demonstration of topics presented.  Learning Barriers/Preferences:   Education Topics: Count Your Pulse:  -Group instruction provided by verbal instruction, demonstration, patient participation and written materials to support subject.  Instructors address importance of being able to find your pulse and how to count your pulse when at home without a heart monitor.  Patients get hands on experience counting their pulse with staff help and individually.   Heart Attack, Angina, and Risk Factor Modification:  -Group instruction provided by verbal instruction, video, and written materials to support subject.  Instructors address signs and symptoms of angina and heart attacks.    Also discuss risk factors for heart disease and how to make changes to improve heart health risk factors.   Functional  Fitness:  -Group instruction provided by verbal instruction, demonstration, patient participation, and written materials to support subject.  Instructors address safety measures for doing things around the house.  Discuss how to get up and down off the floor, how to pick things up properly, how to safely get out of a chair without assistance, and balance training.   Meditation and Mindfulness:  -Group instruction provided by verbal instruction, patient participation, and written materials to support subject.  Instructor addresses importance of mindfulness and meditation practice to help reduce stress and improve awareness.  Instructor also leads participants through a meditation exercise.    Stretching for Flexibility and Mobility:  -Group instruction provided by verbal instruction, patient participation, and written materials to support subject.  Instructors lead participants through series of stretches that are designed to increase flexibility thus improving mobility.  These stretches are additional exercise for major muscle groups that are typically performed during regular warm up and cool down.   Hands Only CPR:  -Group verbal, video, and participation provides a basic overview of AHA guidelines for community CPR. Role-play of emergencies allow participants the opportunity to practice calling for help and chest compression technique with discussion of AED use.   Hypertension: -Group verbal and written instruction that provides a basic overview of hypertension including the most recent diagnostic guidelines, risk factor reduction with self-care instructions and medication management.    Nutrition I class: Heart Healthy Eating:  -Group instruction provided by PowerPoint slides, verbal discussion, and written materials to support subject matter. The instructor gives an explanation and review of the Therapeutic Lifestyle Changes diet recommendations, which includes a discussion on lipid goals,  dietary fat, sodium, fiber, plant stanol/sterol esters, sugar, and the components of a well-balanced, healthy diet.   Nutrition II class: Lifestyle Skills:  -Group instruction provided by PowerPoint slides, verbal discussion, and written materials to support subject matter. The instructor gives an explanation and review of label reading, grocery shopping for heart health, heart healthy recipe modifications, and ways to make healthier choices when eating out.   Diabetes Question & Answer:  -Group instruction provided by PowerPoint slides, verbal discussion, and written materials to support subject matter. The instructor gives an explanation and review of diabetes co-morbidities, pre- and post-prandial blood glucose goals, pre-exercise blood glucose goals, signs, symptoms, and treatment of hypoglycemia and hyperglycemia, and foot care basics.   Diabetes Blitz:  -Group instruction provided by PowerPoint slides, verbal discussion, and written materials to support subject matter. The instructor gives an explanation and review of the physiology behind type 1 and type 2 diabetes, diabetes medications and rational behind using different medications, pre- and post-prandial blood glucose recommendations and Hemoglobin A1c goals, diabetes diet, and exercise including blood glucose guidelines for exercising safely.    Portion Distortion:  -Group instruction  provided by PowerPoint slides, verbal discussion, written materials, and food models to support subject matter. The instructor gives an explanation of serving size versus portion size, changes in portions sizes over the last 20 years, and what consists of a serving from each food group.   Stress Management:  -Group instruction provided by verbal instruction, video, and written materials to support subject matter.  Instructors review role of stress in heart disease and how to cope with stress positively.     Exercising on Your Own:  -Group instruction  provided by verbal instruction, power point, and written materials to support subject.  Instructors discuss benefits of exercise, components of exercise, frequency and intensity of exercise, and end points for exercise.  Also discuss use of nitroglycerin and activating EMS.  Review options of places to exercise outside of rehab.  Review guidelines for sex with heart disease.   Cardiac Drugs I:  -Group instruction provided by verbal instruction and written materials to support subject.  Instructor reviews cardiac drug classes: antiplatelets, anticoagulants, beta blockers, and statins.  Instructor discusses reasons, side effects, and lifestyle considerations for each drug class.   Cardiac Drugs II:  -Group instruction provided by verbal instruction and written materials to support subject.  Instructor reviews cardiac drug classes: angiotensin converting enzyme inhibitors (ACE-I), angiotensin II receptor blockers (ARBs), nitrates, and calcium channel blockers.  Instructor discusses reasons, side effects, and lifestyle considerations for each drug class.   Anatomy and Physiology of the Circulatory System:  Group verbal and written instruction and models provide basic cardiac anatomy and physiology, with the coronary electrical and arterial systems. Review of: AMI, Angina, Valve disease, Heart Failure, Peripheral Artery Disease, Cardiac Arrhythmia, Pacemakers, and the ICD.   Other Education:  -Group or individual verbal, written, or video instructions that support the educational goals of the cardiac rehab program.   Holiday Eating Survival Tips:  -Group instruction provided by PowerPoint slides, verbal discussion, and written materials to support subject matter. The instructor gives patients tips, tricks, and techniques to help them not only survive but enjoy the holidays despite the onslaught of food that accompanies the holidays.   Knowledge Questionnaire Score:  Knowledge Questionnaire Score -  01/16/20 1226      Knowledge Questionnaire Score   Pre Score 23/24           Core Components/Risk Factors/Patient Goals at Admission:  Personal Goals and Risk Factors at Admission - 01/16/20 1159      Core Components/Risk Factors/Patient Goals on Admission    Weight Management Yes;Obesity;Weight Loss    Intervention Weight Management/Obesity: Establish reasonable short term and long term weight goals.;Obesity: Provide education and appropriate resources to help participant work on and attain dietary goals.    Admit Weight 191 lb 2.2 oz (86.7 kg)    Goal Weight: Short Term 185 lb (83.9 kg)    Goal Weight: Long Term 179 lb (81.2 kg)    Expected Outcomes Short Term: Continue to assess and modify interventions until short term weight is achieved;Long Term: Adherence to nutrition and physical activity/exercise program aimed toward attainment of established weight goal;Weight Loss: Understanding of general recommendations for a balanced deficit meal plan, which promotes 1-2 lb weight loss per week and includes a negative energy balance of [id number] kcal/d;Understanding recommendations for meals to include 15-35% energy as protein, 25-35% energy from fat, 35-60% energy from carbohydrates, less than  of dietary cholesterol, 20-35 gm of total fiber daily;Understanding of distribution of calorie intake throughout the day with the consumption of  4-5 meals/snacks    Diabetes Yes    Intervention Provide education about signs/symptoms and action to take for hypo/hyperglycemia.;Provide education about proper nutrition, including hydration, and aerobic/resistive exercise prescription along with prescribed medications to achieve blood glucose in normal ranges: Fasting glucose 65-99 mg/dL    Expected Outcomes Short Term: Participant verbalizes understanding of the signs/symptoms and immediate care of hyper/hypoglycemia, proper foot care and importance of medication, aerobic/resistive exercise and nutrition  plan for blood glucose control.;Long Term: Attainment of HbA1C < 7%.    Hypertension Yes    Intervention Provide education on lifestyle modifcations including regular physical activity/exercise, weight management, moderate sodium restriction and increased consumption of fresh fruit, vegetables, and low fat dairy, alcohol moderation, and smoking cessation.;Monitor prescription use compliance.    Expected Outcomes Short Term: Continued assessment and intervention until BP is < 140/58mm HG in hypertensive participants. < 130/66mm HG in hypertensive participants with diabetes, heart failure or chronic kidney disease.;Long Term: Maintenance of blood pressure at goal levels.    Lipids Yes    Intervention Provide education and support for participant on nutrition & aerobic/resistive exercise along with prescribed medications to achieve LDL 70mg , HDL >40mg .    Expected Outcomes Short Term: Participant states understanding of desired cholesterol values and is compliant with medications prescribed. Participant is following exercise prescription and nutrition guidelines.;Long Term: Cholesterol controlled with medications as prescribed, with individualized exercise RX and with personalized nutrition plan. Value goals: LDL < 70mg , HDL > 40 mg.    Stress Yes    Intervention Offer individual and/or small group education and counseling on adjustment to heart disease, stress management and health-related lifestyle change. Teach and support self-help strategies.;Refer participants experiencing significant psychosocial distress to appropriate mental health specialists for further evaluation and treatment. When possible, include family members and significant others in education/counseling sessions.    Expected Outcomes Short Term: Participant demonstrates changes in health-related behavior, relaxation and other stress management skills, ability to obtain effective social support, and compliance with psychotropic medications  if prescribed.;Long Term: Emotional wellbeing is indicated by absence of clinically significant psychosocial distress or social isolation.           Core Components/Risk Factors/Patient Goals Review:    Core Components/Risk Factors/Patient Goals at Discharge (Final Review):    ITP Comments:  ITP Comments    Row Name 01/16/20 1027           ITP Comments Dr. 03/17/20, Medical Director Cardiac Rehab Armanda Magic              Comments: Patient attended orientation on 01/16/2020 to review rules and guidelines for program.  Completed 6 minute walk test, Intitial ITP, and exercise prescription.  VSS. Telemetry-NSR. Asymptomatic.Safety measures and social distancing in place per CDC guidelines. Patient will participate in VCR program utilizing the [employer] app secondary to lack of insurance coverage at this time. Consent obtained 01/12/20 and invitation sent via text message. Unfortunately patient states she did not receive invitation. Resent today.  Pt  was able to download the Better Hearts app on their smart device with no issues. Pt set up their account and received the following welcome message -"Welcome to the [facility] Cardiac and Pulmonary Rehabilitation program. We hope that you will find the exercise program beneficial in your recovery process. Our staff is available to assist with any questions/concerns about your exercise routine. Best wishes". Brief orientation provided to with the advisement to watch the "Intro to Rehab" series located under the Resource tab. Pt  verbalized understanding. Will continue to follow and monitor pt progress with feedback as needed.

## 2020-01-17 MED ORDER — PANTOPRAZOLE SODIUM 40 MG PO TBEC: 40.0000 mg | DELAYED_RELEASE_TABLET | Freq: Every day | ORAL | 0 refills | Status: DC

## 2020-01-18 MED FILL — JARDIANCE 10 MG TABLET: 10 | 30 days supply | Qty: 30 | Fill #1

## 2020-01-19 ENCOUNTER — Telehealth (HOSPITAL_COMMUNITY): Payer: Self-pay

## 2020-01-19 ENCOUNTER — Encounter (HOSPITAL_COMMUNITY): Admission: RE | Admit: 2020-01-19 | Payer: Self-pay | Source: Ambulatory Visit

## 2020-01-22 MED FILL — METOPROLOL SUCCINATE ER 25: 25 | 30 days supply | Qty: 30 | Fill #1

## 2020-01-23 ENCOUNTER — Encounter: Payer: Self-pay | Admitting: Nutrition

## 2020-01-23 ENCOUNTER — Encounter: Payer: Self-pay | Attending: Family Medicine | Admitting: Nutrition

## 2020-01-23 ENCOUNTER — Other Ambulatory Visit: Payer: Self-pay

## 2020-01-23 ENCOUNTER — Telehealth: Payer: Self-pay | Admitting: Family Medicine

## 2020-01-23 VITALS — Ht 58.5 in | Wt 191.0 lb

## 2020-01-23 DIAGNOSIS — I10 Essential (primary) hypertension: Secondary | ICD-10-CM | POA: Insufficient documentation

## 2020-01-23 DIAGNOSIS — F419 Anxiety disorder, unspecified: Secondary | ICD-10-CM

## 2020-01-23 DIAGNOSIS — E1165 Type 2 diabetes mellitus with hyperglycemia: Secondary | ICD-10-CM | POA: Insufficient documentation

## 2020-01-23 DIAGNOSIS — E118 Type 2 diabetes mellitus with unspecified complications: Secondary | ICD-10-CM | POA: Insufficient documentation

## 2020-01-23 DIAGNOSIS — E785 Hyperlipidemia, unspecified: Secondary | ICD-10-CM | POA: Insufficient documentation

## 2020-01-23 DIAGNOSIS — E669 Obesity, unspecified: Secondary | ICD-10-CM | POA: Insufficient documentation

## 2020-01-23 DIAGNOSIS — IMO0002 Reserved for concepts with insufficient information to code with codable children: Secondary | ICD-10-CM

## 2020-01-23 DIAGNOSIS — I252 Old myocardial infarction: Secondary | ICD-10-CM | POA: Insufficient documentation

## 2020-01-23 MED ORDER — ESCITALOPRAM OXALATE 20 MG PO TABS: 20.0000 mg | ORAL_TABLET | Freq: Every day | ORAL | 3 refills | Status: DC

## 2020-01-23 MED FILL — FUROSEMIDE 20 MG TAB: 20 | 30 days supply | Qty: 30 | Fill #1

## 2020-01-23 MED FILL — ATORVASTATIN 80 MG TABLET: 80 | 30 days supply | Qty: 30 | Fill #1

## 2020-01-23 NOTE — Addendum Note (Signed)
Addended by: Hoy Register on: 01/23/2020 05:38 PM   Modules accepted: Orders

## 2020-01-23 NOTE — Patient Instructions (Addendum)
Goal  Follow My Plate Eat 30 g CHO at each meal. Don't eat past 7 pm Avoid snacks between meals unless blood sugar is low. Read labels for sodium content Test blood sugars twice a day. Get A1C down to 7% Exercise 15 minutes a day.

## 2020-01-23 NOTE — Progress Notes (Signed)
Medical Nutrition Therapy:  Appt start time: 1530 end time:  1630.  Assessment:  Primary concerns today:  Diabetes Type 2, Obesity New DX of DM Type 2  since heart attack. Hasn't had insurance since 2018.Hasn't taken medications since then due to no insurance.  LIves with her sister. Had heart attack June 22nd and had a stent put in. She has lost from 203 down to 191 lbs. Has been doing cardiac rehab virtual. Didn't know she had DM til she had heart attack. Eats 3 meals per day. Working on eating better balanced meals. Testing blood sugars in am; usually FBS  130-140's.  A1C 8.4%. Medications: Metformin 500 mg once a day and Jardiance 10 mg before breakfast. She has had UTI's and yeast infections in the past.  The risks for fourneir's gangrene may outweigh the benefits of this medication in an obese diabetic patient with BMI of 39.  She is willing to work on diet modifications, start using a recumbent bike for exercise and lose weight.   Lab Results  Component Value Date   HGBA1C 8.4 (H) 12/06/2019   CMP Latest Ref Rng & Units 01/08/2020 12/07/2019 12/06/2019  Glucose 65 - 99 mg/dL 784(O) 962(X) 528(U)  BUN 8 - 27 mg/dL 17 7(L) 7(L)  Creatinine 0.57 - 1.00 mg/dL 1.32 4.40 1.02  Sodium 134 - 144 mmol/L 142 139 139  Potassium 3.5 - 5.2 mmol/L 3.6 4.5 4.1  Chloride 96 - 106 mmol/L 103 100 101  CO2 20 - 29 mmol/L 24 29 28   Calcium 8.7 - 10.3 mg/dL 8.9 72.5)  Total Protein 6.5 - 8.1 g/dL - - -  Total Bilirubin 0.3 - 1.2 mg/dL - - -  Alkaline Phos 38 - 126 U/L - - -  AST 15 - 41 U/L - - -  ALT 0 - 44 U/L - - -     Preferred Learning Style:    No preference indicated   Learning Readiness:   Ready  Change in progress   MEDICATIONS:   DIETARY INTAKE:    24-hr recall:  B ( AM): Premeir shake , 2 cup coffee with creamer Snk ( AM):  L ( PM): Rice cake with apple sauce,  1/2 tomato sandwich with lite mayo, water Snk ( PM): nuts or SJ jello D ( PM): Baked chicken,  Asparagus and beets, water Snk ( PM): Beverages: water  Usual physical activity: Walking about 20 minutes a day, working towards 30 minutes. Just got recumbent bike.  Estimated energy needs: 1200  calories 130 g carbohydrates 90 g protein 33 g fat  Progress Towards Goal(s):  In progress.   Nutritional Diagnosis:  NB-1.1 Food and nutrition-related knowledge deficit As related to Diabetes Type 2.  As evidenced by A1C 9.3%.    Intervention:  Nutrition and Diabetes education provided on My Plate, CHO counting, meal planning, portion sizes, timing of meals, avoiding snacks between meals unless having a low blood sugar, target ranges for A1C and blood sugars, signs/symptoms and treatment of hyper/hypoglycemia, monitoring blood sugars, taking medications as prescribed, benefits of exercising 30 minutes per day and prevention of complications of DM. 3.6(U Goal  Follow My Plate Eat 30 g CHO at each meal. Don't eat past 7 pm Avoid snacks between meals unless blood sugar is low. Read labels for sodium content Test blood sugars twice a day. Get A1C down to 7% Exercise 15 minutes a day.  Teaching Method Utilized:  Visual Auditory Hands on  Handouts given during visit include:  The Plate Method   Meal Plan Card  Diabetes Instructions.     Barriers to learning/adherence to lifestyle change: Heart attack  Demonstrated degree of understanding via:  Teach Back   Monitoring/Evaluation:  Dietary intake, exercise, , and body weight in 1 month(s). Recommend to increase metformin doses slowly to max dose of 2000 mg a day. Recommend to discontinue Jardiance as the risks outweigh the benefits in an obese diabetic. May consider Januvia or Glipizide if affordable with patient assistance.

## 2020-01-23 NOTE — Telephone Encounter (Signed)
Pt called regarding her lexapro. Pt states that she had been taking 20 mg of lexapro, but PCP only sent in 10mg . Please advise.     [facility] [place], [place] - [place] [address]  [address] [place]  Phone: [phone] Fax: [phone]  Hours: Not open 24 hours

## 2020-01-23 NOTE — Telephone Encounter (Signed)
Per chart review, provider note from last OV indicates that the patient is stable on 10 mg daily of Lexapro. Will forward to PCP for clarification.

## 2020-01-24 MED FILL — ESCITALOPRAM 20 MG TABLET: 20 | 30 days supply | Qty: 30 | Fill #0

## 2020-01-25 ENCOUNTER — Telehealth: Payer: Self-pay | Admitting: Internal Medicine

## 2020-01-25 NOTE — Telephone Encounter (Signed)
Patient called this evening with reports of lightheadedness and fatigue over the past day, without frank syncope. No other associated symptoms at this time. She called inquiring whether her BP (116/54, heart rate 56 bpm) was too low after an increase in her Losartan to 50mg  on 01/08/20. I informed her that her BP was actually appropriate and that her HR was in line with previous recordings, but to call back if her symptoms persisted beyond the night. She voiced full understanding.

## 2020-02-02 ENCOUNTER — Encounter (HOSPITAL_COMMUNITY)
Admission: RE | Admit: 2020-02-02 | Discharge: 2020-02-02 | Disposition: A | Discharge status: Home / Self Care | Payer: Self-pay | Source: Ambulatory Visit | Attending: Internal Medicine | Admitting: Internal Medicine

## 2020-02-02 ENCOUNTER — Other Ambulatory Visit: Payer: Self-pay

## 2020-02-02 NOTE — Progress Notes (Signed)
Nicole Barry 64 y.o. female Nutrition Note  Diagnosis: CAD S/P percutaneous coronary angioplasty  Status post coronary artery stent placement  NSTEMI (non-ST elevated myocardial infarction) [facility])  Past Medical History:  Diagnosis Date  . Acute diverticulitis 05/17/2014  . Anxiety   . CAD (coronary artery disease)   . Depression   . Diverticulitis 05/17/2014  . HTN (hypertension)   . Hyperlipidemia LDL goal <70   . Non-ST elevation (NSTEMI) myocardial infarction (HCC) 12/05/2019  . Normocytic anemia 05/19/2014  . NSTEMI (non-ST elevated myocardial infarction) (HCC)    12/05/19: DES-LAD  . Sepsis (HCC) 05/19/2014  . Type 2 diabetes mellitus with hyperosmolarity without coma, without long-term current use of insulin (HCC)   . Unstable angina (HCC) 12/05/2019     Medications reviewed.   Current Outpatient Medications:  .  albuterol (VENTOLIN HFA) 108 (90 Base) MCG/ACT inhaler, Inhale 2 puffs into the lungs every 6 (six) hours as needed for wheezing or shortness of breath., Disp: , Rfl:  .  aspirin EC 81 MG EC tablet, Take 1 tablet (81 mg total) by mouth daily. Swallow whole., Disp: 30 tablet, Rfl: 11 .  atorvastatin (LIPITOR) 80 MG tablet, Take 1 tablet (80 mg total) by mouth daily., Disp: 30 tablet, Rfl: 3 .  clopidogrel (PLAVIX) 75 MG tablet, Take 1 tablet (75 mg total) by mouth daily., Disp: 90 tablet, Rfl: 1 .  empagliflozin (JARDIANCE) 10 MG TABS tablet, Take 1 tablet (10 mg total) by mouth daily before breakfast., Disp: 30 tablet, Rfl: 3 .  escitalopram (LEXAPRO) 20 MG tablet, Take 1 tablet (20 mg total) by mouth daily., Disp: 30 tablet, Rfl: 3 .  fluticasone (FLONASE) 50 MCG/ACT nasal spray, Place 1 spray into both nostrils daily., Disp: , Rfl:  .  furosemide (LASIX) 20 MG tablet, Take 1 tablet (20 mg total) by mouth daily., Disp: 30 tablet, Rfl: 3 .  loratadine (CLARITIN) 10 MG tablet, Take 10 mg by mouth daily., Disp: , Rfl:  .  losartan (COZAAR) 50 MG tablet, Take 1 tablet (50  mg total) by mouth daily., Disp: 30 tablet, Rfl: 3 .  metFORMIN (GLUCOPHAGE) 500 MG tablet, Take 1 tablet (500 mg total) by mouth daily with breakfast., Disp: 60 tablet, Rfl: 3 .  metoprolol succinate (TOPROL XL) 25 MG 24 hr tablet, Take 1 tablet (25 mg total) by mouth daily., Disp: 30 tablet, Rfl: 3 .  nitroGLYCERIN (NITROSTAT) 0.4 MG SL tablet, Place 1 tablet (0.4 mg total) under the tongue every 5 (five) minutes as needed for chest pain., Disp: 25 tablet, Rfl: 3 .  pantoprazole (PROTONIX) 40 MG tablet, Take 1 tablet (40 mg total) by mouth daily., Disp: 90 tablet, Rfl: 0   Ht Readings from Last 1 Encounters:  01/23/20 4' 10.5" (1.486 m)     Wt Readings from Last 3 Encounters:  01/23/20 191 lb (86.6 kg)  01/16/20 191 lb 2.2 oz (86.7 kg)  01/11/20 191 lb 3.2 oz (86.7 kg)     There is no height or weight on file to calculate BMI.   Social History   Tobacco Use  Smoking Status Never Smoker  Smokeless Tobacco Never Used     Lab Results  Component Value Date   CHOL 267 (H) 12/06/2019   Lab Results  Component Value Date   HDL 43 12/06/2019   Lab Results  Component Value Date   LDLCALC 160 (H) 12/06/2019   Lab Results  Component Value Date   TRIG 319 (H) 12/06/2019  Lab Results  Component Value Date   HGBA1C 8.4 (H) 12/06/2019     CBG (last 3)  No results for input(s): GLUCAP in the last 72 hours.   Nutrition Note  Spoke with pt. Nutrition Plan and Nutrition Survey goals reviewed with pt. Pt started following a Heart Healthy diet after having STEMI. She recently had diabetes education with RD in [place] and felt adequately educated on managing diabetes and CAD.    Pt has Type 2 Diabetes.  Last A1C was 8.4. She has made many diet changes to lower A1C since last lab value was taken. Pt checks CBG's 1 times a day. Fasting CBG's reportedly 130-140 mg/dL.    Per discussion, pt does not use canned/convenience foods often. Pt does not add salt to food. Pt does not  eat out frequently.  Pt had questions about lipid panel - we reviewed and discussed appropriate changes with diet.  She has lost 13 lbs. Pt expressed understanding of the information reviewed.    Nutrition Diagnosis ? Excessive carbohydrate intake related to food preferences and lack of food related knowledge as evidenced by A1C 8.4  Nutrition Intervention ? Pt's individual nutrition plan reviewed with pt. ? Benefits of adopting Heart Healthy diet discussed when Medficts reviewed.   ? Continue client-centered nutrition education by RD, as part of interdisciplinary care.  Goal(s) ? Pt to identify food quantities necessary to achieve weight loss of 6-24 lb at graduation from cardiac rehab.  ? Pt to build a healthy plate including vegetables, fruits, whole grains, and low-fat dairy products in a heart healthy meal plan. ? CBG concentrations in the normal range or as close to normal as is safely possible. ? Improved blood glucose control as evidenced by pt's A1c trending from 8.4 toward less than 7.0.   Plan:   Will provide client-centered nutrition education as part of interdisciplinary care  Monitor and evaluate progress toward nutrition goal with team.   Andrey Campanile, MS, RDN, LDN

## 2020-02-05 MED FILL — LOSARTAN POTASSIUM 50 MG TA: 50 | 30 days supply | Qty: 30 | Fill #1

## 2020-02-05 MED FILL — METFORMIN HCL 500 MG TABS: 500 | 30 days supply | Qty: 30 | Fill #1

## 2020-02-05 MED FILL — CLOPIDOGREL 75 MG TABLET: 75 | 30 days supply | Qty: 30 | Fill #1

## 2020-02-12 MED FILL — METOPROLOL SUCCINATE ER 25: 25 | 30 days supply | Qty: 30 | Fill #2

## 2020-02-14 ENCOUNTER — Telehealth (HOSPITAL_COMMUNITY): Payer: Self-pay

## 2020-02-14 NOTE — Telephone Encounter (Signed)
Virtual Cardiac Rehab Note:  Unsuccessful telephone call to Alferd Apa to follow up on lack of utilization of the Better Hearts VCR app. Hipaa compliant VM message left requesting callback to [phone].  Ruther Ephraim E. Suzie Portela RN, BSN [place]. [facility]  Cardiac and Pulmonary Rehabilitation Phone: [phone] Fax: [phone]

## 2020-02-20 ENCOUNTER — Encounter (HOSPITAL_COMMUNITY): Payer: Self-pay

## 2020-02-20 DIAGNOSIS — I214 Non-ST elevation (NSTEMI) myocardial infarction: Secondary | ICD-10-CM

## 2020-02-20 DIAGNOSIS — Z955 Presence of coronary angioplasty implant and graft: Secondary | ICD-10-CM

## 2020-02-20 DIAGNOSIS — I2510 Atherosclerotic heart disease of native coronary artery without angina pectoris: Secondary | ICD-10-CM

## 2020-02-20 MED FILL — PANTOPRAZOLE SOD DR 40 MG T: 40 | 30 days supply | Qty: 30 | Fill #1

## 2020-02-20 MED FILL — JARDIANCE 10 MG TABLET: 10 | 30 days supply | Qty: 30 | Fill #2

## 2020-02-20 NOTE — Progress Notes (Signed)
Discharge Progress Report  Patient Details  Name: Nicole Barry MRN: [id number] Date of Birth: Feb 03, 1956 Referring Provider:     CARDIAC REHAB PHASE II ORIENTATION from 01/16/2020 in [facility]ARD[facility]  Referring Provider Chrystie Nose, MD       Number of Visits: Enrolled in VCR from 01/16/20-02-20-20  Reason for Discharge:  Early Exit:  Lack of app utilization and inability to contact patient  Smoking History:  Social History   Tobacco Use  Smoking Status Never Smoker  Smokeless Tobacco Never Used    Diagnosis:  CAD S/P percutaneous coronary angioplasty  Status post coronary artery stent placement  NSTEMI (non-ST elevated myocardial infarction) [facility])  ADL UCSD:   Initial Exercise Prescription:  Initial Exercise Prescription - 01/16/20 1100      Date of Initial Exercise RX and Referring Provider   Date 01/16/20    Referring Provider Chrystie Nose, MD    Expected Discharge Date 03/15/20      Track   Minutes 30      Prescription Details   Frequency (times per week) 5-7    Duration Progress to 30 minutes of continuous aerobic without signs/symptoms of physical distress      Intensity   THRR 40-80% of Max Heartrate 63-126    Ratings of Perceived Exertion 11-13    Perceived Dyspnea 0-4      Progression   Progression Continue to progress workloads to maintain intensity without signs/symptoms of physical distress.      Resistance Training   Training Prescription Yes    Weight --   orange bands   Reps 10-15           Discharge Exercise Prescription (Final Exercise Prescription Changes):   Functional Capacity:  6 Minute Walk    Row Name 01/16/20 1118         6 Minute Walk   Phase Initial     Distance 1400 feet     Walk Time 6 minutes     # of Rest Breaks 0     MPH 2.65     METS 3.07     RPE 12     Perceived Dyspnea  0     VO2 Peak 10.75     Symptoms No     Resting HR 74 bpm     Resting BP 104/52     Resting Oxygen  Saturation  98 %     Exercise Oxygen Saturation  during 6 min walk 100 %     Max Ex. HR 115 bpm     Max Ex. BP 158/52     2 Minute Post BP 122/70            Psychological, QOL, Others - Outcomes: PHQ 2/9: Depression screen [facility] 2/9 01/23/2020 01/16/2020 01/08/2020 01/08/2020  Decreased Interest 0 0 0 1  Down, Depressed, Hopeless 0 0 0 0  PHQ - 2 Score 0 0 0 1  Altered sleeping 0 0 0 1  Tired, decreased energy 0 0 0 1  Change in appetite 0 0 0 0  Feeling bad or failure about yourself  0 0 0 0  Trouble concentrating 0 0 0 0  Moving slowly or fidgety/restless 0 0 0 0  Suicidal thoughts 0 0 0 0  PHQ-9 Score 0 0 0 3    Quality of Life:  Quality of Life - 01/16/20 1229      Quality of Life   Select Quality of Life  Quality of Life Scores   Health/Function Pre 22.87 %    Socioeconomic Pre 22.38 %    Psych/Spiritual Pre 22.57 %    Family Pre 26.4 %    GLOBAL Pre 23.2 %           Personal Goals: Goals established at orientation with interventions provided to work toward goal.  Personal Goals and Risk Factors at Admission - 01/16/20 1159      Core Components/Risk Factors/Patient Goals on Admission    Weight Management Yes;Obesity;Weight Loss    Intervention Weight Management/Obesity: Establish reasonable short term and long term weight goals.;Obesity: Provide education and appropriate resources to help participant work on and attain dietary goals.    Admit Weight 191 lb 2.2 oz (86.7 kg)    Goal Weight: Short Term 185 lb (83.9 kg)    Goal Weight: Long Term 179 lb (81.2 kg)    Expected Outcomes Short Term: Continue to assess and modify interventions until short term weight is achieved;Long Term: Adherence to nutrition and physical activity/exercise program aimed toward attainment of established weight goal;Weight Loss: Understanding of general recommendations for a balanced deficit meal plan, which promotes 1-2 lb weight loss per week and includes a negative energy balance of  [phone] kcal/d;Understanding recommendations for meals to include 15-35% energy as protein, 25-35% energy from fat, 35-60% energy from carbohydrates, less than 200mg  of dietary cholesterol, 20-35 gm of total fiber daily;Understanding of distribution of calorie intake throughout the day with the consumption of 4-5 meals/snacks    Diabetes Yes    Intervention Provide education about signs/symptoms and action to take for hypo/hyperglycemia.;Provide education about proper nutrition, including hydration, and aerobic/resistive exercise prescription along with prescribed medications to achieve blood glucose in normal ranges: Fasting glucose 65-99 mg/dL    Expected Outcomes Short Term: Participant verbalizes understanding of the signs/symptoms and immediate care of hyper/hypoglycemia, proper foot care and importance of medication, aerobic/resistive exercise and nutrition plan for blood glucose control.;Long Term: Attainment of HbA1C < 7%.    Hypertension Yes    Intervention Provide education on lifestyle modifcations including regular physical activity/exercise, weight management, moderate sodium restriction and increased consumption of fresh fruit, vegetables, and low fat dairy, alcohol moderation, and smoking cessation.;Monitor prescription use compliance.    Expected Outcomes Short Term: Continued assessment and intervention until BP is < 140/21mm HG in hypertensive participants. < 130/55mm HG in hypertensive participants with diabetes, heart failure or chronic kidney disease.;Long Term: Maintenance of blood pressure at goal levels.    Lipids Yes    Intervention Provide education and support for participant on nutrition & aerobic/resistive exercise along with prescribed medications to achieve LDL 70mg , HDL >40mg .    Expected Outcomes Short Term: Participant states understanding of desired cholesterol values and is compliant with medications prescribed. Participant is following exercise prescription and  nutrition guidelines.;Long Term: Cholesterol controlled with medications as prescribed, with individualized exercise RX and with personalized nutrition plan. Value goals: LDL < 70mg , HDL > 40 mg.    Stress Yes    Intervention Offer individual and/or small group education and counseling on adjustment to heart disease, stress management and health-related lifestyle change. Teach and support self-help strategies.;Refer participants experiencing significant psychosocial distress to appropriate mental health specialists for further evaluation and treatment. When possible, include family members and significant others in education/counseling sessions.    Expected Outcomes Short Term: Participant demonstrates changes in health-related behavior, relaxation and other stress management skills, ability to obtain effective social support, and compliance with psychotropic medications if  prescribed.;Long Term: Emotional wellbeing is indicated by absence of clinically significant psychosocial distress or social isolation.            Personal Goals Discharge:   Exercise Goals and Review:  Exercise Goals    Row Name 01/16/20 1036             Exercise Goals   Increase Physical Activity Yes       Intervention Provide advice, education, support and counseling about physical activity/exercise needs.;Develop an individualized exercise prescription for aerobic and resistive training based on initial evaluation findings, risk stratification, comorbidities and participant's personal goals.       Expected Outcomes Short Term: Attend rehab on a regular basis to increase amount of physical activity.;Long Term: Exercising regularly at least 3-5 days a week.;Long Term: Add in home exercise to make exercise part of routine and to increase amount of physical activity.       Increase Strength and Stamina Yes       Intervention Provide advice, education, support and counseling about physical activity/exercise needs.;Develop  an individualized exercise prescription for aerobic and resistive training based on initial evaluation findings, risk stratification, comorbidities and participant's personal goals.       Expected Outcomes Short Term: Increase workloads from initial exercise prescription for resistance, speed, and METs.;Short Term: Perform resistance training exercises routinely during rehab and add in resistance training at home;Long Term: Improve cardiorespiratory fitness, muscular endurance and strength as measured by increased METs and functional capacity ( )       Able to understand and use rate of perceived exertion (RPE) scale Yes       Intervention Provide education and explanation on how to use RPE scale       Expected Outcomes Short Term: Able to use RPE daily in rehab to express subjective intensity level;Long Term:  Able to use RPE to guide intensity level when exercising independently       Knowledge and understanding of Target Heart Rate Range (THRR) Yes       Intervention Provide education and explanation of THRR including how the numbers were predicted and where they are located for reference       Expected Outcomes Short Term: Able to state/look up THRR;Long Term: Able to use THRR to govern intensity when exercising independently;Short Term: Able to use daily as guideline for intensity in rehab       Able to check pulse independently Yes       Intervention Provide education and demonstration on how to check pulse in carotid and radial arteries.;Review the importance of being able to check your own pulse for safety during independent exercise       Expected Outcomes Short Term: Able to explain why pulse checking is important during independent exercise;Long Term: Able to check pulse independently and accurately       Understanding of Exercise Prescription Yes       Intervention Provide education, explanation, and written materials on patient's individual exercise prescription       Expected Outcomes  Short Term: Able to explain program exercise prescription;Long Term: Able to explain home exercise prescription to exercise independently              Exercise Goals Re-Evaluation:   Nutrition & Weight - Outcomes:  Pre Biometrics - 01/16/20 1107      Pre Biometrics   Waist Circumference 41.75 inches    Hip Circumference 49.25 inches    Waist to Hip Ratio 0.85 %    Triceps  Skinfold 36 mm    % Body Fat 51.2 %    Grip Strength 32.5 kg    Flexibility 15.75 in    Single Leg Stand 24.56 seconds            Nutrition:   Nutrition Discharge:   Education Questionnaire Score:  Knowledge Questionnaire Score - 01/16/20 1226      Knowledge Questionnaire Score   Pre Score 23/24

## 2020-02-27 ENCOUNTER — Ambulatory Visit: Payer: Self-pay | Admitting: Nutrition

## 2020-02-27 MED FILL — FUROSEMIDE 20 MG TAB: 20 | 30 days supply | Qty: 30 | Fill #2

## 2020-02-27 MED FILL — ATORVASTATIN 80 MG TABLET: 80 | 30 days supply | Qty: 30 | Fill #2

## 2020-03-11 MED FILL — CLOPIDOGREL 75 MG TABLET: 75 | 30 days supply | Qty: 30 | Fill #2

## 2020-03-11 MED FILL — METOPROLOL SUCCINATE ER 25: 25 | 30 days supply | Qty: 30 | Fill #3

## 2020-03-18 ENCOUNTER — Other Ambulatory Visit: Payer: Self-pay

## 2020-03-18 ENCOUNTER — Ambulatory Visit (HOSPITAL_COMMUNITY): Payer: Self-pay | Attending: Cardiovascular Disease

## 2020-03-18 DIAGNOSIS — I214 Non-ST elevation (NSTEMI) myocardial infarction: Secondary | ICD-10-CM | POA: Insufficient documentation

## 2020-03-18 DIAGNOSIS — Z9861 Coronary angioplasty status: Secondary | ICD-10-CM | POA: Insufficient documentation

## 2020-03-18 DIAGNOSIS — I255 Ischemic cardiomyopathy: Secondary | ICD-10-CM | POA: Insufficient documentation

## 2020-03-18 DIAGNOSIS — E669 Obesity, unspecified: Secondary | ICD-10-CM | POA: Insufficient documentation

## 2020-03-18 DIAGNOSIS — I10 Essential (primary) hypertension: Secondary | ICD-10-CM | POA: Insufficient documentation

## 2020-03-18 DIAGNOSIS — E785 Hyperlipidemia, unspecified: Secondary | ICD-10-CM | POA: Insufficient documentation

## 2020-03-18 DIAGNOSIS — I2510 Atherosclerotic heart disease of native coronary artery without angina pectoris: Secondary | ICD-10-CM | POA: Insufficient documentation

## 2020-03-18 LAB — ECHOCARDIOGRAM COMPLETE
Area-P 1/2: 3.17 cm2
S' Lateral: 3.4 cm

## 2020-03-18 MED FILL — METFORMIN HCL 500 MG TABS: 500 | 30 days supply | Qty: 30 | Fill #2

## 2020-03-18 MED FILL — PANTOPRAZOLE SOD DR 40 MG T: 40 | 30 days supply | Qty: 30 | Fill #2

## 2020-03-20 LAB — LIPID PANEL
Chol/HDL Ratio: 3.9 ratio (ref 0.0–4.4)
Cholesterol, Total: 156 mg/dL (ref 100–199)
HDL: 40 mg/dL (ref >39)
LDL Chol Calc (NIH): 89 mg/dL (ref 0–99)
Triglycerides: 153 mg/dL — ABNORMAL HIGH (ref 0–149)
VLDL Cholesterol Cal: 27 mg/dL (ref 5–40)

## 2020-03-20 LAB — COMPREHENSIVE METABOLIC PANEL
ALT: 35 IU/L — ABNORMAL HIGH (ref 0–32)
AST: 21 IU/L (ref 0–40)
Albumin/Globulin Ratio: 1.5 (ref 1.2–2.2)
Albumin: 4.3 g/dL (ref 3.8–4.8)
Alkaline Phosphatase: 80 IU/L (ref 44–121)
BUN/Creatinine Ratio: 24 (ref 12–28)
BUN: 17 mg/dL (ref 8–27)
Bilirubin Total: 0.4 mg/dL (ref 0.0–1.2)
CO2: 28 mmol/L (ref 20–29)
Calcium: 9.6 mg/dL (ref 8.7–10.3)
Chloride: 102 mmol/L (ref 96–106)
Creatinine, Ser: 0.7 mg/dL (ref 0.57–1.00)
GFR calc Af Amer: 107 mL/min/{1.73_m2} (ref >59)
GFR calc non Af Amer: 93 mL/min/{1.73_m2} (ref >59)
Globulin, Total: 2.8 g/dL (ref 1.5–4.5)
Glucose: 125 mg/dL — ABNORMAL HIGH (ref 65–99)
Potassium: 4.4 mmol/L (ref 3.5–5.2)
Sodium: 143 mmol/L (ref 134–144)
Total Protein: 7.1 g/dL (ref 6.0–8.5)

## 2020-03-22 ENCOUNTER — Other Ambulatory Visit (HOSPITAL_COMMUNITY): Payer: Self-pay

## 2020-03-26 ENCOUNTER — Encounter: Payer: Self-pay | Admitting: Internal Medicine

## 2020-03-26 ENCOUNTER — Other Ambulatory Visit: Payer: Self-pay

## 2020-03-26 ENCOUNTER — Other Ambulatory Visit: Payer: Self-pay | Admitting: Internal Medicine

## 2020-03-26 ENCOUNTER — Ambulatory Visit (INDEPENDENT_AMBULATORY_CARE_PROVIDER_SITE_OTHER): Payer: Self-pay | Admitting: Internal Medicine

## 2020-03-26 VITALS — BP 134/80 | HR 70 | Ht <= 58 in | Wt 193.0 lb

## 2020-03-26 DIAGNOSIS — E785 Hyperlipidemia, unspecified: Secondary | ICD-10-CM

## 2020-03-26 DIAGNOSIS — I10 Essential (primary) hypertension: Secondary | ICD-10-CM

## 2020-03-26 DIAGNOSIS — Z9861 Coronary angioplasty status: Secondary | ICD-10-CM

## 2020-03-26 DIAGNOSIS — I255 Ischemic cardiomyopathy: Secondary | ICD-10-CM

## 2020-03-26 DIAGNOSIS — I2510 Atherosclerotic heart disease of native coronary artery without angina pectoris: Secondary | ICD-10-CM

## 2020-03-26 MED ORDER — EZETIMIBE 10 MG PO TABS
10.0000 mg | ORAL_TABLET | Freq: Every day | ORAL | 3 refills | Status: DC
Start: 2020-03-26 — End: 2020-03-26

## 2020-03-26 MED FILL — EZETIMIBE 10 MG TABS: 10 | 30 days supply | Qty: 30 | Fill #0

## 2020-03-26 NOTE — Patient Instructions (Addendum)
Medication Instructions:  START zetia 10mg  daily *If you need a refill on your cardiac medications before your next appointment, please call your pharmacy*   Lab Work: Hepatic Function Panel in 2 weeks FASTING Lipid Panel in 3 months  If you have labs (blood work) drawn today and your tests are completely normal, you will receive your results only by: MyChart Message (if you have MyChart) OR . A paper copy in the mail If you have any lab test that is abnormal or we need to change your treatment, we will call you to review the results.   Testing/Procedures: NONE   Follow-Up: At [facility], you and your health needs are our priority.  As part of our continuing mission to provide you with exceptional heart care, we have created designated Provider Care Teams.  These Care Teams include your primary Cardiologist (physician) and Advanced Practice Providers (APPs -  Physician Assistants and Nurse Practitioners) who all work together to provide you with the care you need, when you need it.  We recommend signing up for the patient portal called "MyChart".  Sign up information is provided on this After Visit Summary.  MyChart is used to connect with patients for Virtual Visits (Telemedicine).  Patients are able to view lab/test results, encounter notes, upcoming appointments, etc.  Non-urgent messages can be sent to your provider as well.   To learn more about what you can do with MyChart, go to [facility].    Your next appointment:   June 2021  The format for your next appointment:   6 months  Provider:   You may see July 2021, MD or one of the following Advanced Practice Providers on your designated Care Team:    Chrystie Nose, PA-C  Azalee Course, PA-C or   Micah Flesher, Judy Pimple    Other Instructions

## 2020-03-27 ENCOUNTER — Encounter: Payer: Self-pay | Admitting: Internal Medicine

## 2020-03-27 NOTE — Progress Notes (Signed)
OFFICE NOTE  Chief Complaint:  Follow-up  Primary Care Physician: Hoy Register, MD  HPI:  Nicole Barry is a 64 y.o. female with a past medial history significant for hypertension and dyslipidemia.  She had been working for [employer] of [place] in [employer] department but was laid off over a year ago.  Prior to this she was on lisinopril HCTZ and rosuvastatin.  Patient been off her medications for more than a year when she presented 12/05/2019 with a NSTEMI.  She was taken to the Cath Lab where she had a 95% proximal LAD stenosis.  This was dilated and stented with a DES.  She had a residual 65% small diagonal narrowing and 30% mid distal LAD narrowing.  Her ejection fraction was 30 to 35% at cath and by echocardiogram. Patient was placed on medications and is seen now in follow-up.  She has been unable to get Medicaid.  Basically all her medications are going to be self-pay.  Fortunately she is done well since last seen.  She says she will not be able to afford Brilinta long-term which is over $400 a month.  She has some dyspnea on exertion, especially when using her arms.  She denies any orthopnea or lower extremity edema.  She has a blood pressure cuff at home but her readings tend to run 10 to 20 mm higher than they do with a arm cuff.  03/26/2020  Nicole Barry returns today for follow-up.  In general she feels well after seeing Corine Shelter, PA-C.  She had an echo which showed normal LVEF 55 to 60%, representing significant improvement from an EF of 30 to 35% as of December 06, 2019.  At the time she had knee and had a drug-eluting stent to the proximal LAD which was 99% stenosed.  She denies any significant shortness of breath with exertion.  Recent labs showed top normal ALT of 35 with total cholesterol 156, triglycerides 153, HDL 40 and LDL 89 on a atorvastatin 80 mg daily.  PMHx:  Past Medical History:  Diagnosis Date   Acute diverticulitis 05/17/2014   Anxiety    CAD (coronary artery disease)      Depression    Diverticulitis 05/17/2014   HTN (hypertension)    Hyperlipidemia LDL goal <70    Non-ST elevation (NSTEMI) myocardial infarction (HCC) 12/05/2019   Normocytic anemia 05/19/2014   NSTEMI (non-ST elevated myocardial infarction) (HCC)    12/05/19: DES-LAD   Sepsis (HCC) 05/19/2014   Type 2 diabetes mellitus with hyperosmolarity without coma, without long-term current use of insulin (HCC)    Unstable angina (HCC) 12/05/2019    Past Surgical History:  Procedure Laterality Date   BREAST SURGERY     CESAREAN SECTION     CORONARY STENT INTERVENTION N/A 12/05/2019   Procedure: CORONARY STENT INTERVENTION;  Surgeon: Kathleene Hazel, MD;  Location: [facility];  Service: Cardiovascular;  Laterality: N/A;   LEFT HEART CATH AND CORONARY ANGIOGRAPHY N/A 12/05/2019   Procedure: LEFT HEART CATH AND CORONARY ANGIOGRAPHY;  Surgeon: Kathleene Hazel, MD;  Location: [facility] CV [facility];  Service: Cardiovascular;  Laterality: N/A;   rotator cull surg     lt shoulder    FAMHx:  Family History  Problem Relation Age of Onset   Heart attack Father    Hypertension Sister    Heart attack Brother    Diabetes Sister    Colon cancer Neg Hx     SOCHx:   reports that  she has never smoked. She has never used smokeless tobacco. She reports current alcohol use. She reports that she does not use drugs.  ALLERGIES:  Allergies  Allergen Reactions   Sulfa Antibiotics Rash    ROS: Pertinent items noted in HPI and remainder of comprehensive ROS otherwise negative.  HOME MEDS: Current Outpatient Medications on File Prior to Visit  Medication Sig Dispense Refill   albuterol (VENTOLIN HFA) 108 (90 Base) MCG/ACT inhaler Inhale 2 puffs into the lungs every 6 (six) hours as needed for wheezing or shortness of breath.     aspirin EC 81 MG EC tablet Take 1 tablet (81 mg total) by mouth daily. Swallow whole. 30 tablet 11   atorvastatin (LIPITOR) 80 MG tablet  Take 1 tablet (80 mg total) by mouth daily. 30 tablet 3   clopidogrel (PLAVIX) 75 MG tablet Take 1 tablet (75 mg total) by mouth daily. 90 tablet 1   empagliflozin (JARDIANCE) 10 MG TABS tablet Take 1 tablet (10 mg total) by mouth daily before breakfast. 30 tablet 3   escitalopram (LEXAPRO) 20 MG tablet Take 1 tablet (20 mg total) by mouth daily. 30 tablet 3   fluticasone (FLONASE) 50 MCG/ACT nasal spray Place 1 spray into both nostrils daily.     furosemide (LASIX) 20 MG tablet Take 1 tablet (20 mg total) by mouth daily. 30 tablet 3   loratadine (CLARITIN) 10 MG tablet Take 10 mg by mouth daily.     losartan (COZAAR) 50 MG tablet Take 1 tablet (50 mg total) by mouth daily. 30 tablet 3   metFORMIN (GLUCOPHAGE) 500 MG tablet Take 1 tablet (500 mg total) by mouth daily with breakfast. 60 tablet 3   metoprolol succinate (TOPROL XL) 25 MG 24 hr tablet Take 1 tablet (25 mg total) by mouth daily. 30 tablet 3   nitroGLYCERIN (NITROSTAT) 0.4 MG SL tablet Place 1 tablet (0.4 mg total) under the tongue every 5 (five) minutes as needed for chest pain. 25 tablet 3   pantoprazole (PROTONIX) 40 MG tablet Take 1 tablet (40 mg total) by mouth daily. 90 tablet 0   No current facility-administered medications on file prior to visit.    LABS/IMAGING: No results found for this or any previous visit (from the past 48 hour(s)). No results found.  LIPID PANEL:    Component Value Date/Time   CHOL 156 03/20/2020 1114   TRIG 153 (H) 03/20/2020 1114   HDL 40 03/20/2020 1114   CHOLHDL 3.9 03/20/2020 1114   CHOLHDL 6.2 12/06/2019 0310   VLDL 64 (H) 12/06/2019 0310   LDLCALC 89 03/20/2020 1114     WEIGHTS: Wt Readings from Last 3 Encounters:  03/26/20 193 lb (87.5 kg)  01/23/20 191 lb (86.6 kg)  01/16/20 191 lb 2.2 oz (86.7 kg)    VITALS: BP 134/80    Pulse 70    Ht 4\' 10"  (1.473 m)    Wt 193 lb (87.5 kg)    SpO2 95%    BMI 40.34 kg/m   EXAM: General appearance: alert and no distress Neck:  no carotid bruit, no JVD and thyroid not enlarged, symmetric, no tenderness/mass/nodules Lungs: clear to auscultation bilaterally Heart: regular rate and rhythm, S1, S2 normal, no murmur, click, rub or gallop Abdomen: soft, non-tender; bowel sounds normal; no masses,  no organomegaly Extremities: extremities normal, atraumatic, no cyanosis or edema Pulses: 2+ and symmetric Skin: Skin color, texture, turgor normal. No rashes or lesions Neurologic: Grossly normal Psych: Pleasant  Examination was chaperoned by  Julaine Fusi, RN.   EKG: Normal sinus rhythm at 70, possible left atrial enlargement, septal infarct- personally reviewed  ASSESSMENT: 1. CAD with prior NSTEMI and PCI to the proximal LAD (11/2019) 2. Ischemic cardiomyopathy, LVEF 30 to 35%-improved to 55 to 60% (03/2020) 3. Mixed dyslipidemia 4. Hypertension 5. Type 2 diabetes  PLAN: 1.   Nicole Barry seems to be doing well without any recurrent chest pain.  Her cardiomyopathy has resolved.  Cholesterol remains above target LDL less than 70 on high potency statin.  Blood pressure is well controlled.  I think she is a good candidate to add ezetimibe for additional LDL lowering.  Start 10 mg daily.  Plan repeat lipids in about 3 months.  Follow-up with me in 6 months or sooner as necessary.  Chrystie Nose, MD, [facility], FACP  [place]   [facility]  Medical Director of the [facility]  [facility] Diplomate of the American Board of Clinical Lipidology Attending Cardiologist  Direct Dial: [phone]   Fax: [phone]  Website:  [email or web address] [email or web address] 03/27/2020, 10:46 PM

## 2020-03-29 ENCOUNTER — Telehealth: Payer: Self-pay | Admitting: Internal Medicine

## 2020-03-29 NOTE — Telephone Encounter (Signed)
Spoke to patient she stated she has been feeling " out of sorts " all week.She is tired,no energy.She had a episode of dizziness while taking a shower this morning.On her way to work she perspired heavy.Stated she was unable to work today due to nausea.Stated yesterday she had chest heaviness off and on all day.No chest heaviness this morning she just feels nauseated and tired.Stated she started Zetia 10 mg 2 days ago.Advised I will send message to Dr.Hilty for advice.

## 2020-03-29 NOTE — Telephone Encounter (Signed)
Pt c/o medication issue:  1. Name of Medication: ezetimibe (ZETIA) 10 MG tablet  2. How are you currently taking this medication (dosage and times per day)? As written  3. Are you having a reaction (difficulty breathing--STAT)? No  4. What is your medication issue? Heavy sweating, fatigued, nauseated, lightheadedness

## 2020-03-29 NOTE — Telephone Encounter (Signed)
Spoke to patient Dr.Hilty's advice given.Advised to call back to report her symptoms.

## 2020-03-29 NOTE — Telephone Encounter (Signed)
I would give the zetia a little more time - if symptoms persist next week, she should try and hold it and see if they improve and let us know.  Dr [name]

## 2020-04-01 ENCOUNTER — Other Ambulatory Visit (HOSPITAL_COMMUNITY): Payer: Self-pay

## 2020-04-02 ENCOUNTER — Other Ambulatory Visit: Payer: Self-pay | Admitting: Internal Medicine

## 2020-04-02 MED FILL — LOSARTAN POTASSIUM 50 MG TA: 50 | 30 days supply | Qty: 30 | Fill #2

## 2020-04-02 MED FILL — FUROSEMIDE 20 MG TAB: 20 | 30 days supply | Qty: 30 | Fill #3

## 2020-04-02 MED FILL — ATORVASTATIN 80 MG TABLET: 80 | 30 days supply | Qty: 30 | Fill #3

## 2020-04-02 NOTE — Telephone Encounter (Signed)
*  STAT* If patient is at the pharmacy, call can be transferred to refill team.   1. Which medications need to be refilled? (please list name of each medication and dose if known)  Need a new prescription for Metoprolol  2. Which pharmacy/location (including street and city if local pharmacy) is medication to be sent to? [facility]  3. Do they need a 30 day or 90 day supply? +0 days and refills

## 2020-04-03 ENCOUNTER — Other Ambulatory Visit: Payer: Self-pay | Admitting: Internal Medicine

## 2020-04-03 MED ORDER — METOPROLOL SUCCINATE ER 25 MG PO TB24: 25.0000 mg | ORAL_TABLET | Freq: Every day | ORAL | 3 refills | Status: DC

## 2020-04-05 MED FILL — CLOPIDOGREL 75 MG TABLET: 75 | 30 days supply | Qty: 30 | Fill #3

## 2020-04-09 MED FILL — METOPROLOL SUCCINATE ER 25: 25 | 90 days supply | Qty: 90 | Fill #0

## 2020-04-10 ENCOUNTER — Encounter: Payer: Self-pay | Admitting: Family Medicine

## 2020-04-10 ENCOUNTER — Other Ambulatory Visit: Payer: Self-pay

## 2020-04-10 ENCOUNTER — Other Ambulatory Visit: Payer: Self-pay | Admitting: Family Medicine

## 2020-04-10 ENCOUNTER — Ambulatory Visit: Payer: Self-pay | Attending: Family Medicine | Admitting: Pharmacist

## 2020-04-10 ENCOUNTER — Ambulatory Visit: Payer: Self-pay | Attending: Family Medicine | Admitting: Family Medicine

## 2020-04-10 VITALS — BP 121/72 | HR 67 | Wt 193.0 lb

## 2020-04-10 DIAGNOSIS — F32A Depression, unspecified: Secondary | ICD-10-CM

## 2020-04-10 DIAGNOSIS — Z79899 Other long term (current) drug therapy: Secondary | ICD-10-CM

## 2020-04-10 DIAGNOSIS — E1159 Type 2 diabetes mellitus with other circulatory complications: Secondary | ICD-10-CM

## 2020-04-10 DIAGNOSIS — F419 Anxiety disorder, unspecified: Secondary | ICD-10-CM

## 2020-04-10 DIAGNOSIS — I10 Essential (primary) hypertension: Secondary | ICD-10-CM

## 2020-04-10 DIAGNOSIS — I214 Non-ST elevation (NSTEMI) myocardial infarction: Secondary | ICD-10-CM

## 2020-04-10 LAB — GLUCOSE, POCT (MANUAL RESULT ENTRY): POC Glucose: 124 mg/dl — AB (ref 70–99)

## 2020-04-10 LAB — POCT GLYCOSYLATED HEMOGLOBIN (HGB A1C): HbA1c, POC (controlled diabetic range): 6.8 % (ref 0.0–7.0)

## 2020-04-10 MED ORDER — PANTOPRAZOLE SODIUM 40 MG PO TBEC
40.0000 mg | DELAYED_RELEASE_TABLET | Freq: Every day | ORAL | 6 refills | Status: DC
Start: 2020-04-10 — End: 2020-11-05

## 2020-04-10 MED ORDER — LOSARTAN POTASSIUM 50 MG PO TABS: 50.0000 mg | ORAL_TABLET | Freq: Every day | ORAL | 6 refills | Status: DC

## 2020-04-10 MED ORDER — HYDROXYZINE HCL 25 MG PO TABS: 25.0000 mg | ORAL_TABLET | Freq: Three times a day (TID) | ORAL | 1 refills | Status: DC | PRN

## 2020-04-10 MED ORDER — ESCITALOPRAM OXALATE 20 MG PO TABS: 20.0000 mg | ORAL_TABLET | Freq: Every day | ORAL | 6 refills | Status: DC

## 2020-04-10 MED ORDER — ATORVASTATIN CALCIUM 80 MG PO TABS: 80.0000 mg | ORAL_TABLET | Freq: Every day | ORAL | 6 refills | Status: DC

## 2020-04-10 MED ORDER — EMPAGLIFLOZIN 10 MG PO TABS: 10.0000 mg | ORAL_TABLET | Freq: Every day | ORAL | 6 refills | Status: DC

## 2020-04-10 MED ORDER — TRULICITY 0.75 MG/0.5ML ~~LOC~~ SOAJ: 0.7500 mg | SUBCUTANEOUS | 6 refills | Status: DC

## 2020-04-10 MED FILL — hydrOXYzine HCL 25 MG TABS: 25 | 20 days supply | Qty: 60 | Fill #0

## 2020-04-10 MED FILL — ESCITALOPRAM 20 MG TABLET: 20 | 30 days supply | Qty: 30 | Fill #0

## 2020-04-10 MED FILL — !TRULICITY 0.75 MG/0.5 ML P: 0.75 | 28 days supply | Qty: 2 | Fill #0

## 2020-04-10 NOTE — Progress Notes (Signed)
Needs to discuss Lexapro medication.

## 2020-04-10 NOTE — Patient Instructions (Signed)
Dulaglutide injection What is this medicine? DULAGLUTIDE (DOO la GLOO tide) is used to improve blood sugar control in adults with type 2 diabetes. This medicine may be used with other oral diabetes medicines. This drug may also reduce the risk of heart attack or stroke if you have type 2 diabetes and risk factors for heart disease. This medicine may be used for other purposes; ask your health care provider or pharmacist if you have questions. COMMON BRAND NAME(S): Trulicity What should I tell my health care provider before I take this medicine? They need to know if you have any of these conditions:  endocrine tumors (MEN 2) or if someone in your family had these tumors  eye disease, vision problems  history of pancreatitis  kidney disease  liver disease  stomach problems  thyroid cancer or if someone in your family had thyroid cancer  an unusual or allergic reaction to dulaglutide, other medicines, foods, dyes, or preservatives  pregnant or trying to get pregnant  breast-feeding How should I use this medicine? This medicine is for injection under the skin of your upper leg (thigh), stomach area, or upper arm. It is usually given once every week (every 7 days). You will be taught how to prepare and give this medicine. Use exactly as directed. Take your medicine at regular intervals. Do not take it more often than directed. If you use this medicine with insulin, you should inject this medicine and the insulin separately. Do not mix them together. Do not give the injections right next to each other. Change (rotate) injection sites with each injection. It is important that you put your used needles and syringes in a special sharps container. Do not put them in a trash can. If you do not have a sharps container, call your pharmacist or healthcare provider to get one. A special MedGuide will be given to you by the pharmacist with each prescription and refill. Be sure to read this information  carefully each time. This drug comes with INSTRUCTIONS FOR USE. Ask your pharmacist for directions on how to use this drug. Read the information carefully. Talk to your pharmacist or health care provider if you have questions. Talk to your pediatrician regarding the use of this medicine in children. Special care may be needed. Overdosage: If you think you have taken too much of this medicine contact a poison control center or emergency room at once. NOTE: This medicine is only for you. Do not share this medicine with others. What if I miss a dose? If you miss a dose, take it as soon as you can within 3 days after the missed dose. Then take your next dose at your regular weekly time. If it has been longer than 3 days after the missed dose, do not take the missed dose. Take the next dose at your regular time. Do not take double or extra doses. If you have questions about a missed dose, contact your health care provider for advice. What may interact with this medicine?  other medicines for diabetes Many medications may cause changes in blood sugar, these include:  alcohol containing beverages  antiviral medicines for HIV or AIDS  aspirin and aspirin-like drugs  certain medicines for blood pressure, heart disease, irregular heart beat  chromium  diuretics  female hormones, such as estrogens or progestins, birth control pills  fenofibrate  gemfibrozil  isoniazid  lanreotide  female hormones or anabolic steroids  MAOIs like Carbex, Eldepryl, Marplan, Nardil, and Parnate  medicines for weight   loss  medicines for allergies, asthma, cold, or cough  medicines for depression, anxiety, or psychotic disturbances  niacin  nicotine  NSAIDs, medicines for pain and inflammation, like ibuprofen or naproxen  octreotide  pasireotide  pentamidine  phenytoin  probenecid  quinolone antibiotics such as ciprofloxacin, levofloxacin, ofloxacin  some herbal dietary  supplements  steroid medicines such as prednisone or cortisone  sulfamethoxazole; trimethoprim  thyroid hormones Some medications can hide the warning symptoms of low blood sugar (hypoglycemia). You may need to monitor your blood sugar more closely if you are taking one of these medications. These include:  beta-blockers, often used for high blood pressure or heart problems (examples include atenolol, metoprolol, propranolol)  clonidine  guanethidine  reserpine This list may not describe all possible interactions. Give your health care provider a list of all the medicines, herbs, non-prescription drugs, or dietary supplements you use. Also tell them if you smoke, drink alcohol, or use illegal drugs. Some items may interact with your medicine. What should I watch for while using this medicine? Visit your doctor or health care professional for regular checks on your progress. Drink plenty of fluids while taking this medicine. Check with your doctor or health care professional if you get an attack of severe diarrhea, nausea, and vomiting. The loss of too much body fluid can make it dangerous for you to take this medicine. A test called the HbA1C (A1C) will be monitored. This is a simple blood test. It measures your blood sugar control over the last 2 to 3 months. You will receive this test every 3 to 6 months. Learn how to check your blood sugar. Learn the symptoms of low and high blood sugar and how to manage them. Always carry a quick-source of sugar with you in case you have symptoms of low blood sugar. Examples include hard sugar candy or glucose tablets. Make sure others know that you can choke if you eat or drink when you develop serious symptoms of low blood sugar, such as seizures or unconsciousness. They must get medical help at once. Tell your doctor or health care professional if you have high blood sugar. You might need to change the dose of your medicine. If you are sick or  exercising more than usual, you might need to change the dose of your medicine. Do not skip meals. Ask your doctor or health care professional if you should avoid alcohol. Many nonprescription cough and cold products contain sugar or alcohol. These can affect blood sugar. Pens should never be shared. Even if the needle is changed, sharing may result in passing of viruses like hepatitis or HIV. Wear a medical ID bracelet or chain, and carry a card that describes your disease and details of your medicine and dosage times. What side effects may I notice from receiving this medicine? Side effects that you should report to your doctor or health care professional as soon as possible:  allergic reactions like skin rash, itching or hives, swelling of the face, lips, or tongue  breathing problems  changes in vision  diarrhea that continues or is severe  lump or swelling on the neck  severe nausea  signs and symptoms of infection like fever or chills; cough; sore throat; pain or trouble passing urine  signs and symptoms of low blood sugar such as feeling anxious, confusion, dizziness, increased hunger, unusually weak or tired, sweating, shakiness, cold, irritable, headache, blurred vision, fast heartbeat, loss of consciousness  signs and symptoms of kidney injury like trouble passing   urine or change in the amount of urine  trouble swallowing  unusual stomach upset or pain  vomiting Side effects that usually do not require medical attention (report to your doctor or health care professional if they continue or are bothersome):  diarrhea  loss of appetite  nausea  pain, redness, or irritation at site where injected  stomach upset This list may not describe all possible side effects. Call your doctor for medical advice about side effects. You may report side effects to FDA at 1-800-FDA-1088. Where should I keep my medicine? Keep out of the reach of children. Store unopened pens in a  refrigerator between 2 and 8 degrees C (36 and 46 degrees F). Do not freeze or use if the medicine has been frozen. Protect from light and excessive heat. Store in the carton until use. Each single-dose pen can be kept at room temperature, not to exceed 30 degrees C (86 degrees F) for a total of 14 days, if needed. Throw away any unused medicine after the expiration date on the label. NOTE: This sheet is a summary. It may not cover all possible information. If you have questions about this medicine, talk to your doctor, pharmacist, or health care provider.  2020 Elsevier/Gold Standard (2019-02-14 09:34:53)  

## 2020-04-10 NOTE — Progress Notes (Signed)
Subjective:  Patient ID: Nicole Barry, female    DOB: 1955-07-12  Age: 64 y.o. MRN: [id number]  CC: Diabetes   HPI Nicole Barry is a 64 year old female with a history of hypertension, type 2 diabetes mellitus (6.8), hyperlipidemia, NSTEMI status post DES to LAD, anxiety and depression who presents today for chronic disease management.   She is going through Anxiety and her Mom has stage 4 Lung ca and when she drives in a car her nerves are 'shot'.  She informs me she received a prescription for 10 mg of Lexapro at her last visit rather than 20 mg which she previously took. I reviewed her med list with her and see where I have sent a prescription for Lexapro 20 mg to the pharmacy however she informs me no one told her of this. Has had a couple of episodes where she had to take Nitroglycerine   A1c is 6.8 which is down from 8.4 and she endorses compliance with [place].  Metformin dose was reduced due to GI side effects and she is tolerating her current dose.  Denies presence of neuropathy or visual abnormalities. Doing well on her statin and Zetia was recently added at her cardiology visit.  Last saw cardiology 2 weeks ago.  She has had to use nitroglycerin on few occasions for chest pain. Past Medical History:  Diagnosis Date  . Acute diverticulitis 05/17/2014  . Anxiety   . CAD (coronary artery disease)   . Depression   . Diverticulitis 05/17/2014  . HTN (hypertension)   . Hyperlipidemia LDL goal <70   . Non-ST elevation (NSTEMI) myocardial infarction ([place]) 12/05/2019  . Normocytic anemia 05/19/2014  . NSTEMI (non-ST elevated myocardial infarction) ([place])    12/05/19: DES-LAD  . Sepsis ([place]) 05/19/2014  . Type 2 diabetes mellitus with hyperosmolarity without coma, without long-term current use of insulin ([place])   . Unstable angina ([place]) 12/05/2019    Past Surgical History:  Procedure Laterality Date  . BREAST SURGERY    . CESAREAN SECTION    . CORONARY STENT INTERVENTION N/A 12/05/2019    Procedure: CORONARY STENT INTERVENTION;  Surgeon: Burnell Blanks, MD;  Location: [facility];  Service: Cardiovascular;  Laterality: N/A;  . LEFT HEART CATH AND CORONARY ANGIOGRAPHY N/A 12/05/2019   Procedure: LEFT HEART CATH AND CORONARY ANGIOGRAPHY;  Surgeon: Burnell Blanks, MD;  Location: [facility];  Service: Cardiovascular;  Laterality: N/A;  . rotator cull surg     lt shoulder    Family History  Problem Relation Age of Onset  . Heart attack Father   . Hypertension Sister   . Heart attack Brother   . Diabetes Sister   . Colon cancer Neg Hx     Allergies  Allergen Reactions  . Sulfa Antibiotics Rash    Outpatient Medications Prior to Visit  Medication Sig Dispense Refill  . albuterol (VENTOLIN HFA) 108 (90 Base) MCG/ACT inhaler Inhale 2 puffs into the lungs every 6 (six) hours as needed for wheezing or shortness of breath.    Marland Kitchen aspirin EC 81 MG EC tablet Take 1 tablet (81 mg total) by mouth daily. Swallow whole. 30 tablet 11  . clopidogrel (PLAVIX) 75 MG tablet Take 1 tablet (75 mg total) by mouth daily. 90 tablet 1  . ezetimibe (ZETIA) 10 MG tablet Take 1 tablet (10 mg total) by mouth daily. 90 tablet 3  . fluticasone (FLONASE) 50 MCG/ACT nasal spray Place 1 spray into both nostrils daily.    Marland Kitchen  furosemide (LASIX) 20 MG tablet Take 1 tablet (20 mg total) by mouth daily. 30 tablet 3  . loratadine (CLARITIN) 10 MG tablet Take 10 mg by mouth daily.    . metFORMIN (GLUCOPHAGE) 500 MG tablet Take 1 tablet (500 mg total) by mouth daily with breakfast. 60 tablet 3  . metoprolol succinate (TOPROL XL) 25 MG 24 hr tablet Take 1 tablet (25 mg total) by mouth daily. 90 tablet 3  . nitroGLYCERIN (NITROSTAT) 0.4 MG SL tablet Place 1 tablet (0.4 mg total) under the tongue every 5 (five) minutes as needed for chest pain. 25 tablet 3  . atorvastatin (LIPITOR) 80 MG tablet Take 1 tablet (80 mg total) by mouth daily. 30 tablet 3  . empagliflozin (JARDIANCE) 10 MG  TABS tablet Take 1 tablet (10 mg total) by mouth daily before breakfast. 30 tablet 3  . escitalopram (LEXAPRO) 20 MG tablet Take 1 tablet (20 mg total) by mouth daily. 30 tablet 3  . losartan (COZAAR) 50 MG tablet Take 1 tablet (50 mg total) by mouth daily. 30 tablet 3  . pantoprazole (PROTONIX) 40 MG tablet Take 1 tablet (40 mg total) by mouth daily. 90 tablet 0   No facility-administered medications prior to visit.     ROS Review of Systems  Constitutional: Negative for activity change, appetite change and fatigue.  HENT: Negative for congestion, sinus pressure and sore throat.   Eyes: Negative for visual disturbance.  Respiratory: Negative for cough, chest tightness, shortness of breath and wheezing.   Cardiovascular: Negative for chest pain and palpitations.  Gastrointestinal: Negative for abdominal distention, abdominal pain and constipation.  Endocrine: Negative for polydipsia.  Genitourinary: Negative for dysuria and frequency.  Musculoskeletal: Negative for arthralgias and back pain.  Skin: Negative for rash.  Neurological: Negative for tremors, light-headedness and numbness.  Hematological: Does not bruise/bleed easily.  Psychiatric/Behavioral: Negative for agitation and behavioral problems.    Objective:  BP 121/72   Pulse 67   Wt 193 lb (87.5 kg)   SpO2 99%   BMI 40.34 kg/m   BP/Weight 04/10/2020 03/26/2020 5/40/9811  Systolic BP 914 782 -  Diastolic BP 72 80 -  Wt. (Lbs) 193 193 191  BMI 40.34 40.34 39.24      Physical Exam Constitutional:      Appearance: She is well-developed.  Neck:     Vascular: No JVD.  Cardiovascular:     Rate and Rhythm: Normal rate.     Heart sounds: Normal heart sounds. No murmur heard.   Pulmonary:     Effort: Pulmonary effort is normal.     Breath sounds: Normal breath sounds. No wheezing or rales.  Chest:     Chest wall: No tenderness.  Abdominal:     General: Bowel sounds are normal. There is no distension.      Palpations: Abdomen is soft. There is no mass.     Tenderness: There is no abdominal tenderness.  Musculoskeletal:        General: Normal range of motion.     Right lower leg: No edema.     Left lower leg: No edema.  Neurological:     Mental Status: She is alert and oriented to person, place, and time.  Psychiatric:        Mood and Affect: Mood normal.     CMP Latest Ref Rng & Units 03/20/2020 01/08/2020 12/07/2019  Glucose 65 - 99 mg/dL 125(H) 142(H) 214(H)  BUN 8 - 27 mg/dL 17 17 7(L)  Creatinine  0.57 - 1.00 mg/dL 0.70 0.63 0.79  Sodium 134 - 144 mmol/L 143 142 139  Potassium 3.5 - 5.2 mmol/L 4.4 3.6 4.5  Chloride 96 - 106 mmol/L 102 103 100  CO2 20 - 29 mmol/L _0 Calcium 8.7 - 10.3 mg/dL 9.6 10.0 8.9  Total Protein 6.0 - 8.5 g/dL 7.1 - -  Total Bilirubin 0.0 - 1.2 mg/dL 0.4 - -  Alkaline Phos 44 - 121 IU/L 80 - -  AST 0 - 40 IU/L 21 - -  ALT 0 - 32 IU/L 35(H) - -    Lipid Panel     Component Value Date/Time   CHOL 156 03/20/2020 1114   TRIG 153 (H) 03/20/2020 1114   HDL 40 03/20/2020 1114   CHOLHDL 3.9 03/20/2020 1114   CHOLHDL 6.2 12/06/2019 0310   VLDL 64 (H) 12/06/2019 0310   LDLCALC 89 03/20/2020 1114    CBC    Component Value Date/Time   WBC 6.4 01/08/2020 1030   WBC 10.2 12/07/2019 0434   RBC 4.47 01/08/2020 1030   RBC 4.68 12/07/2019 0434   HGB 13.7 01/08/2020 1030   HCT 41.3 01/08/2020 1030   PLT 394 01/08/2020 1030   MCV 92 01/08/2020 1030   MCH 30.6 01/08/2020 1030   MCH 30.6 12/07/2019 0434   MCHC 33.2 01/08/2020 1030   MCHC 33.9 12/07/2019 0434   RDW 12.5 01/08/2020 1030   LYMPHSABS 1.5 01/08/2020 1030   MONOABS 0.5 12/05/2019 1550   EOSABS 0.3 01/08/2020 1030   BASOSABS 0.0 01/08/2020 1030    Lab Results  Component Value Date   HGBA1C 6.8 04/10/2020    Assessment & Plan:  1. Type 2 diabetes mellitus with other circulatory complication, without long-term current use of insulin (HCC) Controlled with A1c of 6.8; goal is less than  7.0 Commended on improvement of A1c which has trended down from 8.4 previously Placed on Trulicity which would be beneficial with regard to weight loss and Metformin has been discontinued due to GI side effects Counseled on Diabetic diet, my plate method, 536 minutes of moderate intensity exercise/week Blood sugar logs with fasting goals of 80-120 mg/dl, random of less than 180 and in the event of sugars less than 60 mg/dl or greater than 400 mg/dl encouraged to notify the clinic. Advised on the need for annual eye exams, annual foot exams, Pneumonia vaccine. - POCT glucose (manual entry) - POCT glycosylated hemoglobin (Hb A1C) - CMP14+EGFR - Dulaglutide (TRULICITY) 6.44 IH/4.7QQ SOPN; Inject 0.75 mg into the skin once a week. After 1 month, increase to 1.5 mg once a week  Dispense: 2 mL; Refill: 6 - empagliflozin (JARDIANCE) 10 MG TABS tablet; Take 1 tablet (10 mg total) by mouth daily before breakfast.  Dispense: 30 tablet; Refill: 6  2. Anxiety and depression Uncontrolled Triggered by terminal diagnosis of Mom Hydroxyzine added to regimen She will benefit from psychotherapy and has been referred to the LCSW - escitalopram (LEXAPRO) 20 MG tablet; Take 1 tablet (20 mg total) by mouth daily.  Dispense: 30 tablet; Refill: 6 - hydrOXYzine (ATARAX/VISTARIL) 25 MG tablet; Take 1 tablet (25 mg total) by mouth 3 (three) times daily as needed.  Dispense: 60 tablet; Refill: 1 - Ambulatory referral to Social Work  3. Essential hypertension Controlled Counseled on blood pressure goal of less than 130/80, low-sodium, DASH diet, medication compliance, 150 minutes of moderate intensity exercise per week. Discussed medication compliance, adverse effects. - losartan (COZAAR) 50 MG tablet; Take 1 tablet (  50 mg total) by mouth daily.  Dispense: 30 tablet; Refill: 6  4. Morbid obesity ([place]) Discussed reducing portion sizes, increasing physical activity Hopefully Trulicity will be beneficial  5. NSTEMI  (non-ST elevated myocardial infarction) ([facility]) Status post DES to LAD Risk factor modification No angina at this time Follow-up with cardiology - atorvastatin (LIPITOR) 80 MG tablet; Take 1 tablet (80 mg total) by mouth daily.  Dispense: 30 tablet; Refill: 6    Meds ordered this encounter  Medications  . escitalopram (LEXAPRO) 20 MG tablet    Sig: Take 1 tablet (20 mg total) by mouth daily.    Dispense:  30 tablet    Refill:  6  . losartan (COZAAR) 50 MG tablet    Sig: Take 1 tablet (50 mg total) by mouth daily.    Dispense:  30 tablet    Refill:  6  . Dulaglutide (TRULICITY) 4.98 YM/4.1RA SOPN    Sig: Inject 0.75 mg into the skin once a week. After 1 month, increase to 1.5 mg once a week    Dispense:  2 mL    Refill:  6  . hydrOXYzine (ATARAX/VISTARIL) 25 MG tablet    Sig: Take 1 tablet (25 mg total) by mouth 3 (three) times daily as needed.    Dispense:  60 tablet    Refill:  1  . empagliflozin (JARDIANCE) 10 MG TABS tablet    Sig: Take 1 tablet (10 mg total) by mouth daily before breakfast.    Dispense:  30 tablet    Refill:  6  . atorvastatin (LIPITOR) 80 MG tablet    Sig: Take 1 tablet (80 mg total) by mouth daily.    Dispense:  30 tablet    Refill:  6  . pantoprazole (PROTONIX) 40 MG tablet    Sig: Take 1 tablet (40 mg total) by mouth daily.    Dispense:  30 tablet    Refill:  6    Follow-up: Return in about 3 months (around 07/11/2020) for chronic disease management.       Charlott Rakes, MD, FAAFP. [facility] [place], [place]   04/10/2020, 2:18 PM

## 2020-04-10 NOTE — Progress Notes (Signed)
Patient was educated on the use of the Trulicity pen. Reviewed necessary supplies and operation of the pen. Also reviewed goal blood glucose levels. Patient was able to demonstrate use. All questions and concerns were addressed.  Patient seen with:  Levada Schilling, PharmD Candidate  [facility] SOP  Class of 2022  Butch Penny, PharmD, CPP Clinical Pharmacist [facility] [phone]

## 2020-04-11 ENCOUNTER — Telehealth: Payer: Self-pay

## 2020-04-11 LAB — CMP14+EGFR
ALT: 29 IU/L (ref 0–32)
AST: 20 IU/L (ref 0–40)
Albumin/Globulin Ratio: 1.8 (ref 1.2–2.2)
Albumin: 4.5 g/dL (ref 3.8–4.8)
Alkaline Phosphatase: 87 IU/L (ref 44–121)
BUN/Creatinine Ratio: 18 (ref 12–28)
BUN: 17 mg/dL (ref 8–27)
Bilirubin Total: 0.3 mg/dL (ref 0.0–1.2)
CO2: 25 mmol/L (ref 20–29)
Calcium: 9.6 mg/dL (ref 8.7–10.3)
Chloride: 105 mmol/L (ref 96–106)
Creatinine, Ser: 0.97 mg/dL (ref 0.57–1.00)
GFR calc Af Amer: 72 mL/min/{1.73_m2} (ref >59)
GFR calc non Af Amer: 62 mL/min/{1.73_m2} (ref >59)
Globulin, Total: 2.5 g/dL (ref 1.5–4.5)
Glucose: 126 mg/dL — ABNORMAL HIGH (ref 65–99)
Potassium: 4.3 mmol/L (ref 3.5–5.2)
Sodium: 145 mmol/L — ABNORMAL HIGH (ref 134–144)
Total Protein: 7 g/dL (ref 6.0–8.5)

## 2020-04-11 NOTE — Telephone Encounter (Signed)
Patient has viewed results via mychart.  Patient was sent a message with normal results via mychart.

## 2020-04-11 NOTE — Telephone Encounter (Signed)
-----   Message from Enobong Newlin, MD sent at 04/11/2020 12:22 PM EDT ----- °Please inform the patient that labs are normal. Thank you. °

## 2020-04-22 ENCOUNTER — Institutional Professional Consult (permissible substitution): Payer: Self-pay | Admitting: Licensed Clinical Social Worker

## 2020-04-24 MED FILL — PANTOPRAZOLE SOD DR 40 MG T: 40 | 30 days supply | Qty: 30 | Fill #3

## 2020-04-24 MED FILL — EZETIMIBE 10 MG TABS: 10 | 30 days supply | Qty: 30 | Fill #1

## 2020-05-03 MED FILL — LOSARTAN POTASSIUM 50 MG TA: 50 | 30 days supply | Qty: 30 | Fill #3

## 2020-05-06 MED FILL — ATORVASTATIN CALCIUM 80 MG: 80 | 30 days supply | Qty: 30 | Fill #0

## 2020-05-06 MED FILL — CLOPIDOGREL 75 MG TABLET: 75 | 30 days supply | Qty: 30 | Fill #4

## 2020-05-07 ENCOUNTER — Telehealth: Payer: Self-pay | Admitting: Internal Medicine

## 2020-05-07 MED ORDER — FUROSEMIDE 20 MG PO TABS
20.0000 mg | ORAL_TABLET | Freq: Every day | ORAL | 3 refills | Status: DC
Start: 2020-05-07 — End: 2020-11-05

## 2020-05-07 NOTE — Telephone Encounter (Signed)
*  STAT* If patient is at the pharmacy, call can be transferred to refill team.   1. Which medications need to be refilled? (please list name of each medication and dose if known) furosemide (LASIX) 20 MG tablet  2. Which pharmacy/location (including street and city if local pharmacy) is medication to be sent to? [facility] [place], [place] - [place] [address]  3. Do they need a 30 day or 90 day supply? 90 day   Patient is out of medication

## 2020-05-07 NOTE — Telephone Encounter (Signed)
Prescription refilled as requested.

## 2020-05-08 ENCOUNTER — Ambulatory Visit: Payer: 59 | Attending: Family Medicine | Admitting: Pharmacist

## 2020-05-08 ENCOUNTER — Other Ambulatory Visit: Payer: Self-pay

## 2020-05-08 ENCOUNTER — Encounter: Payer: Self-pay | Admitting: Pharmacist

## 2020-05-08 ENCOUNTER — Other Ambulatory Visit: Payer: Self-pay | Admitting: Pharmacist

## 2020-05-08 DIAGNOSIS — E785 Hyperlipidemia, unspecified: Secondary | ICD-10-CM | POA: Diagnosis not present

## 2020-05-08 DIAGNOSIS — E1159 Type 2 diabetes mellitus with other circulatory complications: Secondary | ICD-10-CM

## 2020-05-08 MED ORDER — TRULICITY 1.5 MG/0.5ML ~~LOC~~ SOAJ: 1.5000 mg | SUBCUTANEOUS | 3 refills | Status: DC

## 2020-05-08 MED ORDER — ROSUVASTATIN CALCIUM 40 MG PO TABS: 40.0000 mg | ORAL_TABLET | Freq: Every day | ORAL | 3 refills | Status: DC

## 2020-05-08 MED FILL — !TRULICITY 1.5 MG/0.5 ML PE: 1.5 | 28 days supply | Qty: 2 | Fill #0

## 2020-05-08 MED FILL — FUROSEMIDE 20 MG TABS: 20 | 30 days supply | Qty: 30 | Fill #0

## 2020-05-08 MED FILL — ROSUVASTATIN CALCIUM 40 MG: 40 | 30 days supply | Qty: 30 | Fill #0

## 2020-05-08 NOTE — Progress Notes (Signed)
° ° °  S:     PCP: Dr. Alvis Lemmings PMH: T2DM, HLD, CAD s/p NSTEMI, systolic HF, anxiety, depression  Patient arrives in good spirits. Presents for diabetes evaluation, education, and management.  Patient was referred and last seen by Primary Care Provider on 04/10/20. At that visit, metformin was discontinued due to GI side effects and Trulicity was initiated.   Today, patient reports medication adherence and no side effects with Trulicity and Jardiance. Reports checking fasting sugars in the mornings. Reports highest CBG at 130. Denies sugars <70. Reports new onset muscle aches since adding ezetimibe with atorvastatin for lipid management.   Family/Social History:  -Fhx: heart attack in father and brother; HTN in sister; diabetes in sister -Tobacco use: denies   Insurance coverage/medication affordability: [employer]  Medication adherence reported.   Current diabetes medications include: Trulicity 0.75 mg weekly (Sundays), Jardiance 10 mg daily Current hypertension medications include: Toprol XL 25 mg daily, losartan 50 mg daily Current hyperlipidemia medications include: atorvastatin 80 mg daily, ezetimibe 10 mg daily   Patient denies hypoglycemic events.  Patient reported dietary habits: Eats 3 meals/day Breakfast: premier shake Lunch: rice cake with peanut butter, fruits Dinner: baked/grilled chicken, steam vegetables, limits red meat once a week Snacks: nuts Drinks: water, sweet tea  Patient-reported exercise habits: tries to walk around plant (work) sometimes   Patient denies nocturia (nighttime urination).  Patient denies neuropathy (nerve pain). Patient denies visual changes. Patient reports self foot exams.     O:   Lab Results  Component Value Date   HGBA1C 6.8 04/10/2020   There were no vitals filed for this visit.  Lipid Panel     Component Value Date/Time   CHOL 156 03/20/2020 1114   TRIG 153 (H) 03/20/2020 1114   HDL 40 03/20/2020 1114   CHOLHDL 3.9 03/20/2020  1114   CHOLHDL 6.2 12/06/2019 0310   VLDL 64 (H) 12/06/2019 0310   LDLCALC 89 03/20/2020 1114    Home fasting blood sugars: 130 2 hour post-meal/random blood sugars: not checking  Clinical Atherosclerotic Cardiovascular Disease (ASCVD): Yes  The ASCVD Risk score Denman George DC Jr., et al., 2013) failed to calculate for the following reasons:   The patient has a prior MI or stroke diagnosis    A/P: Diabetes longstanding currently controlled. Medication adherence appears optimal. Pt is tolerating Trulicity well with no complaints. Will increase to target dose. Encouraged patient to aim for a diet full of vegetables, fruit and lean meats (chicken, [place], fish), monitor carb intake, and limit sugar intake. Encouraged patient to exercise at least 20-30 minutes daily with the goal of 150 minutes per week. Patient verbalized understanding.  -Increased Trulicity to 1.5 mg weekly (Sundays) -Continued Jardiance 10 mg daily -Extensively discussed pathophysiology of diabetes, recommended lifestyle interventions, dietary effects on blood sugar control -Counseled on s/sx of and management of hypoglycemia -Next A1C anticipated January 2022.   ASCVD risk - Secondary prevention in patient with diabetes. Last LDL is not controlled. High intensity statin indicated. Patient experiencing muscle cramping with ezetimibe and atorvastatin. Will switch atorvastatin to rosuvastatin which tends to have lower rates of myalgias.  -Started rosuvastatin 40 mg daily -Discontinued atorvastatin  Written patient instructions provided.  Total time in face to face counseling 25 minutes.   Follow up PCP Clinic Visit in 2 months.     Fabio Neighbors, PharmD, BCPS PGY2 Ambulatory Care Resident [facility]   Pharmacy

## 2020-05-23 MED FILL — EZETIMIBE 10 MG TABS: 10 | 30 days supply | Qty: 30 | Fill #2

## 2020-05-23 MED FILL — PANTOPRAZOLE SOD DR 40 MG T: 40 | 30 days supply | Qty: 30 | Fill #4

## 2020-05-29 ENCOUNTER — Ambulatory Visit: Payer: Self-pay

## 2020-05-29 ENCOUNTER — Other Ambulatory Visit: Payer: Self-pay

## 2020-05-29 ENCOUNTER — Ambulatory Visit: Payer: 59 | Attending: Family Medicine

## 2020-05-29 DIAGNOSIS — Z20822 Contact with and (suspected) exposure to covid-19: Secondary | ICD-10-CM

## 2020-05-29 NOTE — Progress Notes (Signed)
Pt arrived at clinic for covid testing, pt states that she was exposed by her granddaughter and had headache and sore throat on yesterday.  Pt was informed that she will be contacted with results.

## 2020-05-29 NOTE — Telephone Encounter (Signed)
   IY    1        Nicole Barry Female, 64 y.o., 1955-10-13  MRN:  [id number] Phone:  [phone] Judie Petit)       PCP:  Hoy Register, MD Coverage:  [place] [employer] Other  Next Appt With Internal Medicine 07/10/2020 at 1:30 PM         Message from [facility] sent at 05/29/2020 9:49 AM EST  Pt called stating that she was in direct contact with someone who tested positive for covid on Saturday. She states that she had a headache, fatigue, aching feeling yesterday. She states that today she feels fine. Pt is concerned as she was in contact with someone else with stage 4 lung cancer. Please advise.    Call History   Type Contact Phone/Fax User  05/29/2020 09:47 AM EST Phone (Incoming) [name], [name] (Self) [phone] [name]) [name], [name]   Pt. States she was exposed Saturday to someone who has tested positive for COVID 19 . Had fatigue, headache yesterday. No symptoms today. Requesting to be tested at the practice today.Per [name] in the practice, nurse visit scheduled for today. Pt. Needs to stay in her car and call the practice to let them know she is there for her COVID 19 test. Pt. Verbalizes understanding.

## 2020-05-29 NOTE — Telephone Encounter (Signed)
Pt has been tested for COVID and will be informed once results are received.

## 2020-05-31 LAB — SARS-COV-2, NAA 2 DAY TAT

## 2020-05-31 LAB — NOVEL CORONAVIRUS, NAA: SARS-CoV-2, NAA: NOT DETECTED

## 2020-06-05 MED FILL — $TRULICITY 1.5 MG/0.5 ML PE: 1.5 | 28 days supply | Qty: 2 | Fill #1

## 2020-06-06 MED FILL — ROSUVASTATIN CALCIUM 40 MG: 40 | 30 days supply | Qty: 30 | Fill #1

## 2020-06-06 MED FILL — FUROSEMIDE 20 MG TABS: 20 | 30 days supply | Qty: 30 | Fill #1

## 2020-06-06 MED FILL — LOSARTAN POTASSIUM 50 MG TA: 50 | 30 days supply | Qty: 30 | Fill #0

## 2020-06-11 MED FILL — CLOPIDOGREL 75 MG TABLET: 75 | 30 days supply | Qty: 30 | Fill #5

## 2020-06-24 MED FILL — PANTOPRAZOLE SOD DR 40 MG T: 40 | 30 days supply | Qty: 30 | Fill #5

## 2020-06-24 MED FILL — EZETIMIBE 10 MG TABS: 10 | 30 days supply | Qty: 30 | Fill #3

## 2020-07-04 MED FILL — ESCITALOPRAM 20 MG TABLET: 20 | 30 days supply | Qty: 30 | Fill #1

## 2020-07-04 MED FILL — FUROSEMIDE 20 MG TABS: 20 | 30 days supply | Qty: 30 | Fill #2

## 2020-07-04 MED FILL — ROSUVASTATIN CALCIUM 40 MG: 40 | 30 days supply | Qty: 30 | Fill #2

## 2020-07-04 MED FILL — LOSARTAN POTASSIUM 50 MG TA: 50 | 30 days supply | Qty: 30 | Fill #1

## 2020-07-04 MED FILL — $TRULICITY 1.5 MG/0.5 ML PE: 1.5 | 28 days supply | Qty: 2 | Fill #2

## 2020-07-10 ENCOUNTER — Other Ambulatory Visit: Payer: Self-pay

## 2020-07-10 ENCOUNTER — Other Ambulatory Visit: Payer: Self-pay | Admitting: Cardiology

## 2020-07-10 ENCOUNTER — Other Ambulatory Visit: Payer: Self-pay | Admitting: Internal Medicine

## 2020-07-10 ENCOUNTER — Ambulatory Visit: Payer: 59 | Attending: Family Medicine | Admitting: Family Medicine

## 2020-07-10 VITALS — BP 124/75 | HR 62 | Ht <= 58 in | Wt 191.6 lb

## 2020-07-10 DIAGNOSIS — E785 Hyperlipidemia, unspecified: Secondary | ICD-10-CM

## 2020-07-10 DIAGNOSIS — Z23 Encounter for immunization: Secondary | ICD-10-CM | POA: Diagnosis not present

## 2020-07-10 DIAGNOSIS — I10 Essential (primary) hypertension: Secondary | ICD-10-CM

## 2020-07-10 DIAGNOSIS — F32A Depression, unspecified: Secondary | ICD-10-CM

## 2020-07-10 DIAGNOSIS — E1159 Type 2 diabetes mellitus with other circulatory complications: Secondary | ICD-10-CM | POA: Diagnosis not present

## 2020-07-10 DIAGNOSIS — I214 Non-ST elevation (NSTEMI) myocardial infarction: Secondary | ICD-10-CM | POA: Diagnosis not present

## 2020-07-10 DIAGNOSIS — F419 Anxiety disorder, unspecified: Secondary | ICD-10-CM

## 2020-07-10 LAB — POCT GLYCOSYLATED HEMOGLOBIN (HGB A1C): HbA1c, POC (controlled diabetic range): 6.3 % (ref 0.0–7.0)

## 2020-07-10 LAB — GLUCOSE, POCT (MANUAL RESULT ENTRY): POC Glucose: 120 mg/dl — AB (ref 70–99)

## 2020-07-10 MED FILL — CLOPIDOGREL 75 MG TABLET: 75 | 30 days supply | Qty: 30 | Fill #0

## 2020-07-10 MED FILL — METOPROLOL SUCCINATE ER 25: 25 | 30 days supply | Qty: 30 | Fill #1

## 2020-07-10 NOTE — Progress Notes (Signed)
Subjective:  Patient ID: Nicole Barry, female    DOB: Mar 20, 1956  Age: 65 y.o. MRN: [id number]  CC: Diabetes and Anxiety   HPI Nicole Barry is a 65 year old female with a history of hypertension, type 2 diabetes mellitus (6.3), hyperlipidemia, NSTEMI status post DES to LAD, anxiety and depression who presents today for chronic disease management.  Her Anxiety is better and she uses Hydroxyzine prn.  With regards to her diabetes mellitus she has been compliant with her medications and denies visual concerns or numbness in extremities. Her eye exam was in 04/2020 at [facility], [facility]  She has no chest pain or dyspnea and has no pedal edema.  Compliant with her statin and antihypertensive. She has no additional concerns today Past Medical History:  Diagnosis Date  . Acute diverticulitis 05/17/2014  . Anxiety   . CAD (coronary artery disease)   . Depression   . Diverticulitis 05/17/2014  . HTN (hypertension)   . Hyperlipidemia LDL goal <70   . Non-ST elevation (NSTEMI) myocardial infarction ([place]) 12/05/2019  . Normocytic anemia 05/19/2014  . NSTEMI (non-ST elevated myocardial infarction) ([place])    12/05/19: DES-LAD  . Sepsis ([place]) 05/19/2014  . Type 2 diabetes mellitus with hyperosmolarity without coma, without long-term current use of insulin ([place])   . Unstable angina ([place]) 12/05/2019    Past Surgical History:  Procedure Laterality Date  . BREAST SURGERY    . CESAREAN SECTION    . CORONARY STENT INTERVENTION N/A 12/05/2019   Procedure: CORONARY STENT INTERVENTION;  Surgeon: Burnell Blanks, MD;  Location: [place] [facility];  Service: Cardiovascular;  Laterality: N/A;  . LEFT HEART CATH AND CORONARY ANGIOGRAPHY N/A 12/05/2019   Procedure: LEFT HEART CATH AND CORONARY ANGIOGRAPHY;  Surgeon: Burnell Blanks, MD;  Location: [place] [facility];  Service: Cardiovascular;  Laterality: N/A;  . rotator cull surg     lt shoulder    Family History  Problem Relation Age of  Onset  . Heart attack Father   . Hypertension Sister   . Heart attack Brother   . Diabetes Sister   . Colon cancer Neg Hx     Allergies  Allergen Reactions  . Sulfa Antibiotics Rash    Outpatient Medications Prior to Visit  Medication Sig Dispense Refill  . albuterol (VENTOLIN HFA) 108 (90 Base) MCG/ACT inhaler Inhale 2 puffs into the lungs every 6 (six) hours as needed for wheezing or shortness of breath.    Marland Kitchen aspirin EC 81 MG EC tablet Take 1 tablet (81 mg total) by mouth daily. Swallow whole. 30 tablet 11  . clopidogrel (PLAVIX) 75 MG tablet TAKE 1 TABLET (75 MG TOTAL) BY MOUTH DAILY. 30 tablet 1  . Dulaglutide (TRULICITY) 1.5 NF/6.2ZH SOPN Inject 1.5 mg into the skin once a week. 0.5 mL 3  . empagliflozin (JARDIANCE) 10 MG TABS tablet Take 1 tablet (10 mg total) by mouth daily before breakfast. 30 tablet 6  . escitalopram (LEXAPRO) 20 MG tablet Take 1 tablet (20 mg total) by mouth daily. 30 tablet 6  . fluticasone (FLONASE) 50 MCG/ACT nasal spray Place 1 spray into both nostrils daily.    . furosemide (LASIX) 20 MG tablet Take 1 tablet (20 mg total) by mouth daily. 90 tablet 3  . hydrOXYzine (ATARAX/VISTARIL) 25 MG tablet Take 1 tablet (25 mg total) by mouth 3 (three) times daily as needed. 60 tablet 1  . loratadine (CLARITIN) 10 MG tablet Take 10 mg by mouth daily.    Marland Kitchen  losartan (COZAAR) 50 MG tablet Take 1 tablet (50 mg total) by mouth daily. 30 tablet 6  . metoprolol succinate (TOPROL XL) 25 MG 24 hr tablet Take 1 tablet (25 mg total) by mouth daily. 90 tablet 3  . nitroGLYCERIN (NITROSTAT) 0.4 MG SL tablet Place 1 tablet (0.4 mg total) under the tongue every 5 (five) minutes as needed for chest pain. 25 tablet 3  . pantoprazole (PROTONIX) 40 MG tablet Take 1 tablet (40 mg total) by mouth daily. 30 tablet 6  . rosuvastatin (CRESTOR) 40 MG tablet Take 1 tablet (40 mg total) by mouth daily. 90 tablet 3  . ezetimibe (ZETIA) 10 MG tablet Take 1 tablet (10 mg total) by mouth daily. 90  tablet 3  . metFORMIN (GLUCOPHAGE) 500 MG tablet Take 1 tablet (500 mg total) by mouth daily with breakfast. (Patient not taking: Reported on 07/10/2020) 60 tablet 3   No facility-administered medications prior to visit.     ROS Review of Systems  Constitutional: Negative for activity change, appetite change and fatigue.  HENT: Negative for congestion, sinus pressure and sore throat.   Eyes: Negative for visual disturbance.  Respiratory: Negative for cough, chest tightness, shortness of breath and wheezing.   Cardiovascular: Negative for chest pain and palpitations.  Gastrointestinal: Negative for abdominal distention, abdominal pain and constipation.  Endocrine: Negative for polydipsia.  Genitourinary: Negative for dysuria and frequency.  Musculoskeletal: Negative for arthralgias and back pain.  Skin: Negative for rash.  Neurological: Negative for tremors, light-headedness and numbness.  Hematological: Does not bruise/bleed easily.  Psychiatric/Behavioral: Negative for agitation and behavioral problems.    Objective:  BP 124/75   Pulse 62   Ht '4\' 10"'  (1.473 m)   Wt 191 lb 9.6 oz (86.9 kg)   SpO2 97%   BMI 40.04 kg/m   BP/Weight 07/10/2020 04/10/2020 45/99/7741  Systolic BP 423 953 202  Diastolic BP 75 72 80  Wt. (Lbs) 191.6 193 193  BMI 40.04 40.34 40.34      Physical Exam Constitutional:      Appearance: She is well-developed.  Neck:     Vascular: No JVD.  Cardiovascular:     Rate and Rhythm: Normal rate.     Heart sounds: Normal heart sounds. No murmur heard.   Pulmonary:     Effort: Pulmonary effort is normal.     Breath sounds: Normal breath sounds. No wheezing or rales.  Chest:     Chest wall: No tenderness.  Abdominal:     General: Bowel sounds are normal. There is no distension.     Palpations: Abdomen is soft. There is no mass.     Tenderness: There is no abdominal tenderness.  Musculoskeletal:        General: Normal range of motion.     Right  lower leg: No edema.     Left lower leg: No edema.  Neurological:     Mental Status: She is alert and oriented to person, place, and time.  Psychiatric:        Mood and Affect: Mood normal.     CMP Latest Ref Rng & Units 04/10/2020 03/20/2020 01/08/2020  Glucose 65 - 99 mg/dL 126(H) 125(H) 142(H)  BUN 8 - 27 mg/dL '17 17 17  ' Creatinine 0.57 - 1.00 mg/dL 0.97 0.70 0.63  Sodium 134 - 144 mmol/L 145(H) 143 142  Potassium 3.5 - 5.2 mmol/L 4.3 4.4 3.6  Chloride 96 - 106 mmol/L 105 102 103  CO2 20 - 29 mmol/L 25  28 24  Calcium 8.7 - 10.3 mg/dL 9.6 9.6 10.0  Total Protein 6.0 - 8.5 g/dL 7.0 7.1 -  Total Bilirubin 0.0 - 1.2 mg/dL 0.3 0.4 -  Alkaline Phos 44 - 121 IU/L 87 80 -  AST 0 - 40 IU/L 20 21 -  ALT 0 - 32 IU/L 29 35(H) -    Lipid Panel     Component Value Date/Time   CHOL 156 03/20/2020 1114   TRIG 153 (H) 03/20/2020 1114   HDL 40 03/20/2020 1114   CHOLHDL 3.9 03/20/2020 1114   CHOLHDL 6.2 12/06/2019 0310   VLDL 64 (H) 12/06/2019 0310   LDLCALC 89 03/20/2020 1114    CBC    Component Value Date/Time   WBC 6.4 01/08/2020 1030   WBC 10.2 12/07/2019 0434   RBC 4.47 01/08/2020 1030   RBC 4.68 12/07/2019 0434   HGB 13.7 01/08/2020 1030   HCT 41.3 01/08/2020 1030   PLT 394 01/08/2020 1030   MCV 92 01/08/2020 1030   MCH 30.6 01/08/2020 1030   MCH 30.6 12/07/2019 0434   MCHC 33.2 01/08/2020 1030   MCHC 33.9 12/07/2019 0434   RDW 12.5 01/08/2020 1030   LYMPHSABS 1.5 01/08/2020 1030   MONOABS 0.5 12/05/2019 1550   EOSABS 0.3 01/08/2020 1030   BASOSABS 0.0 01/08/2020 1030    Lab Results  Component Value Date   HGBA1C 6.3 07/10/2020    Assessment & Plan:  1. Type 2 diabetes mellitus with other circulatory complication, without long-term current use of insulin (HCC) Controlled with A1c of 6.3 Continue current regimen - POCT glucose (manual entry) - POCT glycosylated hemoglobin (Hb A1C)  2. Anxiety and depression Controlled on Lexapro and hydroxyzine  3. Morbid  obesity ([place]) Counseled on reducing portion sizes, exercising  4. NSTEMI (non-ST elevated myocardial infarction) ([place]) Status post DES to LAD Asymptomatic Risk factor modification Continue Plavix and aspirin  5. Essential hypertension Controlled Continue antihypertensive - CMP14+EGFR  6. Hyperlipidemia LDL goal <70 LDL is 89 slightly above goal of less than 70 Currently on a statin and Zetia Low-cholesterol diet  7. Need for immunization against influenza - Flu Vaccine QUAD 36+ mos IM    No orders of the defined types were placed in this encounter.   Follow-up: Return in about 1 month (around 08/10/2020) for complete physical exam.       Charlott Rakes, MD, FAAFP. [facility] [place], [place]   07/10/2020, 5:49 PM

## 2020-07-11 LAB — CMP14+EGFR
ALT: 21 IU/L (ref 0–32)
AST: 19 IU/L (ref 0–40)
Albumin/Globulin Ratio: 1.7 (ref 1.2–2.2)
Albumin: 4.6 g/dL (ref 3.8–4.8)
Alkaline Phosphatase: 76 IU/L (ref 44–121)
BUN/Creatinine Ratio: 15 (ref 12–28)
BUN: 12 mg/dL (ref 8–27)
Bilirubin Total: 0.4 mg/dL (ref 0.0–1.2)
CO2: 28 mmol/L (ref 20–29)
Calcium: 9.5 mg/dL (ref 8.7–10.3)
Chloride: 99 mmol/L (ref 96–106)
Creatinine, Ser: 0.78 mg/dL (ref 0.57–1.00)
GFR calc Af Amer: 94 mL/min/{1.73_m2} (ref >59)
GFR calc non Af Amer: 81 mL/min/{1.73_m2} (ref >59)
Globulin, Total: 2.7 g/dL (ref 1.5–4.5)
Glucose: 100 mg/dL — ABNORMAL HIGH (ref 65–99)
Potassium: 4.2 mmol/L (ref 3.5–5.2)
Sodium: 142 mmol/L (ref 134–144)
Total Protein: 7.3 g/dL (ref 6.0–8.5)

## 2020-07-12 ENCOUNTER — Telehealth: Payer: Self-pay

## 2020-07-12 NOTE — Telephone Encounter (Signed)
-----   Message from Hoy Register, MD sent at 07/11/2020  2:26 PM EST ----- Please inform the patient that labs are normal. Thank you.

## 2020-07-12 NOTE — Telephone Encounter (Signed)
Patient name and DOB has been verified Patient was informed of lab results. Patient had no questions.  

## 2020-07-16 ENCOUNTER — Encounter: Payer: Self-pay | Admitting: Family Medicine

## 2020-07-19 ENCOUNTER — Other Ambulatory Visit: Payer: Self-pay | Admitting: Family Medicine

## 2020-07-23 MED FILL — PANTOPRAZOLE SOD DR 40 MG T: 40 | 30 days supply | Qty: 30 | Fill #6

## 2020-07-23 MED FILL — EZETIMIBE 10 MG TABS: 10 | 30 days supply | Qty: 30 | Fill #4

## 2020-07-29 MED FILL — $TRULICITY 1.5 MG/0.5 ML PE: 1.5 | 28 days supply | Qty: 2 | Fill #3

## 2020-08-08 ENCOUNTER — Other Ambulatory Visit: Payer: Self-pay

## 2020-08-08 ENCOUNTER — Ambulatory Visit: Payer: 59 | Attending: Family Medicine | Admitting: Family Medicine

## 2020-08-08 ENCOUNTER — Other Ambulatory Visit (HOSPITAL_COMMUNITY)
Admission: RE | Admit: 2020-08-08 | Discharge: 2020-08-08 | Disposition: A | Discharge status: Home / Self Care | Payer: 59 | Source: Ambulatory Visit | Attending: Family Medicine | Admitting: Family Medicine

## 2020-08-08 ENCOUNTER — Encounter: Payer: Self-pay | Admitting: Family Medicine

## 2020-08-08 ENCOUNTER — Other Ambulatory Visit: Payer: Self-pay | Admitting: Pharmacist

## 2020-08-08 VITALS — BP 125/71 | HR 62 | Ht <= 58 in | Wt 192.0 lb

## 2020-08-08 DIAGNOSIS — Z124 Encounter for screening for malignant neoplasm of cervix: Secondary | ICD-10-CM | POA: Diagnosis not present

## 2020-08-08 DIAGNOSIS — Z1211 Encounter for screening for malignant neoplasm of colon: Secondary | ICD-10-CM

## 2020-08-08 DIAGNOSIS — Z0000 Encounter for general adult medical examination without abnormal findings: Secondary | ICD-10-CM

## 2020-08-08 DIAGNOSIS — Z23 Encounter for immunization: Secondary | ICD-10-CM

## 2020-08-08 MED ORDER — ALBUTEROL SULFATE HFA 108 (90 BASE) MCG/ACT IN AERS: 2.0000 | INHALATION_SPRAY | Freq: Four times a day (QID) | RESPIRATORY_TRACT | 1 refills | Status: DC | PRN

## 2020-08-08 MED ORDER — ALBUTEROL SULFATE HFA 108 (90 BASE) MCG/ACT IN AERS: 2.0000 | INHALATION_SPRAY | Freq: Four times a day (QID) | RESPIRATORY_TRACT | 2 refills | Status: DC | PRN

## 2020-08-08 MED FILL — FUROSEMIDE 20 MG TABS: 20 | 30 days supply | Qty: 30 | Fill #3

## 2020-08-08 MED FILL — ALBUTEROL SULFATE HFA 108 (: 108 (90 BAS | 25 days supply | Qty: 9 | Fill #0

## 2020-08-08 MED FILL — ROSUVASTATIN CALCIUM 40 MG: 40 | 30 days supply | Qty: 30 | Fill #3

## 2020-08-08 MED FILL — LOSARTAN POTASSIUM 50 MG TA: 50 | 30 days supply | Qty: 30 | Fill #2

## 2020-08-08 MED FILL — CLOPIDOGREL 75 MG TABLET: 75 | 30 days supply | Qty: 30 | Fill #1

## 2020-08-08 MED FILL — METOPROLOL SUCCINATE ER 25: 25 | 30 days supply | Qty: 30 | Fill #2

## 2020-08-08 NOTE — Patient Instructions (Signed)
Health Maintenance, Female Adopting a healthy lifestyle and getting preventive care are important in promoting health and wellness. Ask your health care provider about:  The right schedule for you to have regular tests and exams.  Things you can do on your own to prevent diseases and keep yourself healthy. What should I know about diet, weight, and exercise? Eat a healthy diet  Eat a diet that includes plenty of vegetables, fruits, low-fat dairy products, and lean protein.  Do not eat a lot of foods that are high in solid fats, added sugars, or sodium.   Maintain a healthy weight Body mass index (BMI) is used to identify weight problems. It estimates body fat based on height and weight. Your health care provider can help determine your BMI and help you achieve or maintain a healthy weight. Get regular exercise Get regular exercise. This is one of the most important things you can do for your health. Most adults should:  Exercise for at least 150 minutes each week. The exercise should increase your heart rate and make you sweat (moderate-intensity exercise).  Do strengthening exercises at least twice a week. This is in addition to the moderate-intensity exercise.  Spend less time sitting. Even light physical activity can be beneficial. Watch cholesterol and blood lipids Have your blood tested for lipids and cholesterol at 64 years of age, then have this test every 5 years. Have your cholesterol levels checked more often if:  Your lipid or cholesterol levels are high.  You are older than 64 years of age.  You are at high risk for heart disease. What should I know about cancer screening? Depending on your health history and family history, you may need to have cancer screening at various ages. This may include screening for:  Breast cancer.  Cervical cancer.  Colorectal cancer.  Skin cancer.  Lung cancer. What should I know about heart disease, diabetes, and high blood  pressure? Blood pressure and heart disease  High blood pressure causes heart disease and increases the risk of stroke. This is more likely to develop in people who have high blood pressure readings, are of African descent, or are overweight.  Have your blood pressure checked: ? Every 3-5 years if you are 18-39 years of age. ? Every year if you are 40 years old or older. Diabetes Have regular diabetes screenings. This checks your fasting blood sugar level. Have the screening done:  Once every three years after age 40 if you are at a normal weight and have a low risk for diabetes.  More often and at a younger age if you are overweight or have a high risk for diabetes. What should I know about preventing infection? Hepatitis B If you have a higher risk for hepatitis B, you should be screened for this virus. Talk with your health care provider to find out if you are at risk for hepatitis B infection. Hepatitis C Testing is recommended for:  Everyone born from 1945 through 1965.  Anyone with known risk factors for hepatitis C. Sexually transmitted infections (STIs)  Get screened for STIs, including gonorrhea and chlamydia, if: ? You are sexually active and are younger than 64 years of age. ? You are older than 64 years of age and your health care provider tells you that you are at risk for this type of infection. ? Your sexual activity has changed since you were last screened, and you are at increased risk for chlamydia or gonorrhea. Ask your health care provider   if you are at risk.  Ask your health care provider about whether you are at high risk for HIV. Your health care provider may recommend a prescription medicine to help prevent HIV infection. If you choose to take medicine to prevent HIV, you should first get tested for HIV. You should then be tested every 3 months for as long as you are taking the medicine. Pregnancy  If you are about to stop having your period (premenopausal) and  you may become pregnant, seek counseling before you get pregnant.  Take 400 to 800 micrograms (mcg) of folic acid every day if you become pregnant.  Ask for birth control (contraception) if you want to prevent pregnancy. Osteoporosis and menopause Osteoporosis is a disease in which the bones lose minerals and strength with aging. This can result in bone fractures. If you are 65 years old or older, or if you are at risk for osteoporosis and fractures, ask your health care provider if you should:  Be screened for bone loss.  Take a calcium or vitamin D supplement to lower your risk of fractures.  Be given hormone replacement therapy (HRT) to treat symptoms of menopause. Follow these instructions at home: Lifestyle  Do not use any products that contain nicotine or tobacco, such as cigarettes, e-cigarettes, and chewing tobacco. If you need help quitting, ask your health care provider.  Do not use street drugs.  Do not share needles.  Ask your health care provider for help if you need support or information about quitting drugs. Alcohol use  Do not drink alcohol if: ? Your health care provider tells you not to drink. ? You are pregnant, may be pregnant, or are planning to become pregnant.  If you drink alcohol: ? Limit how much you use to 0-1 drink a day. ? Limit intake if you are breastfeeding.  Be aware of how much alcohol is in your drink. In the [place] one drink equals one 12 oz bottle of beer (355 mL), one 5 oz glass of wine (148 mL), or one 1 oz glass of hard liquor (44 mL). General instructions  Schedule regular health, dental, and eye exams.  Stay current with your vaccines.  Tell your health care provider if: ? You often feel depressed. ? You have ever been abused or do not feel safe at home. Summary  Adopting a healthy lifestyle and getting preventive care are important in promoting health and wellness.  Follow your health care provider's instructions about healthy  diet, exercising, and getting tested or screened for diseases.  Follow your health care provider's instructions on monitoring your cholesterol and blood pressure. This information is not intended to replace advice given to you by your health care provider. Make sure you discuss any questions you have with your health care provider. Document Revised: 05/25/2018 Document Reviewed: 05/25/2018 Elsevier Patient Education  2021 Elsevier Inc.  

## 2020-08-08 NOTE — Progress Notes (Signed)
Subjective:  Patient ID: Nicole Barry, female    DOB: 1955/09/25  Age: 65 y.o. MRN: [id number]  CC: Annual Exam and Gynecologic Exam   HPI Nicole Barry  is a 65 year old femalewith a history ofhypertension, type 2 diabetes mellitus(6.3), hyperlipidemia, NSTEMIstatus post DES to LAD, anxiety and depression who presents today for   She had her mammogram at [facility] 3 weeks ago and mammogram was negative for malignancy. She is due for Pap smear today and a colonoscopy.  Last fecal occult test from 12/2019 was negative for occult blood.  Past Medical History:  Diagnosis Date  . Acute diverticulitis 05/17/2014  . Anxiety   . CAD (coronary artery disease)   . Depression   . Diverticulitis 05/17/2014  . HTN (hypertension)   . Hyperlipidemia LDL goal <70   . Non-ST elevation (NSTEMI) myocardial infarction (HCC) 12/05/2019  . Normocytic anemia 05/19/2014  . NSTEMI (non-ST elevated myocardial infarction) (HCC)    12/05/19: DES-LAD  . Sepsis (HCC) 05/19/2014  . Type 2 diabetes mellitus with hyperosmolarity without coma, without long-term current use of insulin (HCC)   . Unstable angina (HCC) 12/05/2019    Past Surgical History:  Procedure Laterality Date  . BREAST SURGERY    . CESAREAN SECTION    . CORONARY STENT INTERVENTION N/A 12/05/2019   Procedure: CORONARY STENT INTERVENTION;  Surgeon: Kathleene Hazel, MD;  Location: [facility];  Service: Cardiovascular;  Laterality: N/A;  . LEFT HEART CATH AND CORONARY ANGIOGRAPHY N/A 12/05/2019   Procedure: LEFT HEART CATH AND CORONARY ANGIOGRAPHY;  Surgeon: Kathleene Hazel, MD;  Location: [facility];  Service: Cardiovascular;  Laterality: N/A;  . rotator cull surg     lt shoulder    Family History  Problem Relation Age of Onset  . Heart attack Father   . Hypertension Sister   . Heart attack Brother   . Diabetes Sister   . Colon cancer Neg Hx     Allergies  Allergen Reactions  . Sulfa Antibiotics Rash     Outpatient Medications Prior to Visit  Medication Sig Dispense Refill  . aspirin EC 81 MG EC tablet Take 1 tablet (81 mg total) by mouth daily. Swallow whole. 30 tablet 11  . clopidogrel (PLAVIX) 75 MG tablet TAKE 1 TABLET (75 MG TOTAL) BY MOUTH DAILY. 30 tablet 1  . Dulaglutide (TRULICITY) 1.5 MG/0.5ML SOPN Inject 1.5 mg into the skin once a week. 0.5 mL 3  . empagliflozin (JARDIANCE) 10 MG TABS tablet Take 1 tablet (10 mg total) by mouth daily before breakfast. 30 tablet 6  . escitalopram (LEXAPRO) 20 MG tablet Take 1 tablet (20 mg total) by mouth daily. 30 tablet 6  . fluticasone (FLONASE) 50 MCG/ACT nasal spray Place 1 spray into both nostrils daily.    . furosemide (LASIX) 20 MG tablet Take 1 tablet (20 mg total) by mouth daily. 90 tablet 3  . hydrOXYzine (ATARAX/VISTARIL) 25 MG tablet Take 1 tablet (25 mg total) by mouth 3 (three) times daily as needed. 60 tablet 1  . loratadine (CLARITIN) 10 MG tablet Take 10 mg by mouth daily.    Marland Kitchen losartan (COZAAR) 50 MG tablet Take 1 tablet (50 mg total) by mouth daily. 30 tablet 6  . metoprolol succinate (TOPROL XL) 25 MG 24 hr tablet Take 1 tablet (25 mg total) by mouth daily. 90 tablet 3  . nitroGLYCERIN (NITROSTAT) 0.4 MG SL tablet Place 1 tablet (0.4 mg total) under the tongue every 5 (  five) minutes as needed for chest pain. 25 tablet 3  . pantoprazole (PROTONIX) 40 MG tablet Take 1 tablet (40 mg total) by mouth daily. 30 tablet 6  . rosuvastatin (CRESTOR) 40 MG tablet Take 1 tablet (40 mg total) by mouth daily. 90 tablet 3  . albuterol (VENTOLIN HFA) 108 (90 Base) MCG/ACT inhaler Inhale 2 puffs into the lungs every 6 (six) hours as needed for wheezing or shortness of breath.    . ezetimibe (ZETIA) 10 MG tablet Take 1 tablet (10 mg total) by mouth daily. 90 tablet 3  . metFORMIN (GLUCOPHAGE) 500 MG tablet Take 1 tablet (500 mg total) by mouth daily with breakfast. (Patient not taking: No sig reported) 60 tablet 3   No facility-administered  medications prior to visit.     ROS Review of Systems  Constitutional: Negative for activity change, appetite change and fatigue.  HENT: Negative for congestion, sinus pressure and sore throat.   Eyes: Negative for visual disturbance.  Respiratory: Negative for cough, chest tightness, shortness of breath and wheezing.   Cardiovascular: Negative for chest pain and palpitations.  Gastrointestinal: Negative for abdominal distention, abdominal pain and constipation.  Endocrine: Negative for polydipsia.  Genitourinary: Negative for dysuria and frequency.  Musculoskeletal: Negative for arthralgias and back pain.  Skin: Negative for rash.  Neurological: Negative for tremors, light-headedness and numbness.  Hematological: Does not bruise/bleed easily.  Psychiatric/Behavioral: Negative for agitation and behavioral problems.    Objective:  BP 125/71   Pulse 62   Ht 4\' 10"  (1.473 m)   Wt 192 lb (87.1 kg)   SpO2 98%   BMI 40.13 kg/m   BP/Weight 08/08/2020 07/10/2020 04/10/2020  Systolic BP 125 124 121  Diastolic BP 71 75 72  Wt. (Lbs) 192 191.6 193  BMI 40.13 40.04 40.34      Physical Exam Constitutional:      General: She is not in acute distress.    Appearance: She is well-developed and well-nourished. She is not diaphoretic.  HENT:     Head: Normocephalic.     Right Ear: External ear normal.     Left Ear: External ear normal.     Nose: Nose normal.     Mouth/Throat:     Mouth: Oropharynx is clear and moist.  Eyes:     Extraocular Movements: EOM normal.     Conjunctiva/sclera: Conjunctivae normal.     Pupils: Pupils are equal, round, and reactive to light.  Neck:     Vascular: No JVD.  Cardiovascular:     Rate and Rhythm: Normal rate and regular rhythm.     Pulses: Intact distal pulses.     Heart sounds: Normal heart sounds. No murmur heard. No gallop.   Pulmonary:     Effort: Pulmonary effort is normal. No respiratory distress.     Breath sounds: Normal breath  sounds. No wheezing or rales.  Chest:     Chest wall: No tenderness.  Breasts:     Right: No mass or tenderness.     Left: No mass or tenderness.    Abdominal:     General: Bowel sounds are normal. There is no distension.     Palpations: Abdomen is soft. There is no mass.     Tenderness: There is no abdominal tenderness.  Genitourinary:    Comments: External genitalia, vagina, cervix-normal Musculoskeletal:        General: No tenderness or edema. Normal range of motion.     Cervical back: Normal range of motion.  Skin:    General: Skin is warm and dry.  Neurological:     Mental Status: She is alert and oriented to person, place, and time.     Deep Tendon Reflexes: Reflexes are normal and symmetric.  Psychiatric:        Mood and Affect: Mood and affect normal.     CMP Latest Ref Rng & Units 07/10/2020 04/10/2020 03/20/2020  Glucose 65 - 99 mg/dL 630(Z) 601(U) 932(T)  BUN 8 - 27 mg/dL 12 17 17   Creatinine 0.57 - 1.00 mg/dL 5.57 3.22  Sodium 134 - 144 mmol/L 142 145(H) 143  Potassium 3.5 - 5.2 mmol/L 4.2 4.3 4.4  Chloride 96 - 106 mmol/L 99 105 102  CO2 20 - 29 mmol/L 28 25 28   Calcium 8.7 - 10.3 mg/dL 9.5 9.6 9.6  Total Protein 6.0 - 8.5 g/dL 7.3 7.0 7.1  Total Bilirubin 0.0 - 1.2 mg/dL 0.4 0.3 0.4  Alkaline Phos 44 - 121 IU/L 76 87 80  AST 0 - 40 IU/L 19 20 21   ALT 0 - 32 IU/L 21 29 35(H)    Lipid Panel     Component Value Date/Time   CHOL 156 03/20/2020 1114   TRIG 153 (H) 03/20/2020 1114   HDL 40 03/20/2020 1114   CHOLHDL 3.9 03/20/2020 1114   CHOLHDL 6.2 12/06/2019 0310   VLDL 64 (H) 12/06/2019 0310   LDLCALC 89 03/20/2020 1114    CBC    Component Value Date/Time   WBC 6.4 01/08/2020 1030   WBC 10.2 12/07/2019 0434   RBC 4.47 01/08/2020 1030   RBC 4.68 12/07/2019 0434   HGB 13.7 01/08/2020 1030   HCT 41.3 01/08/2020 1030   PLT 394 01/08/2020 1030   MCV 92 01/08/2020 1030   MCH 30.6 01/08/2020 1030   MCH 30.6 12/07/2019 0434   MCHC 33.2  01/08/2020 1030   MCHC 33.9 12/07/2019 0434   RDW 12.5 01/08/2020 1030   LYMPHSABS 1.5 01/08/2020 1030   MONOABS 0.5 12/05/2019 1550   EOSABS 0.3 01/08/2020 1030   BASOSABS 0.0 01/08/2020 1030    Lab Results  Component Value Date   HGBA1C 6.3 07/10/2020    Assessment & Plan:  1. Annual physical exam Counseled on 150 minutes of exercise per week, healthy eating (including decreased daily intake of saturated fats, cholesterol, added sugars, sodium),routine healthcare maintenance.   2. Screening for colon cancer - Ambulatory referral to Gastroenterology  3. Screening for cervical cancer - Cytology - PAP([place])   Meds ordered this encounter  Medications  . albuterol (VENTOLIN HFA) 108 (90 Base) MCG/ACT inhaler    Sig: Inhale 2 puffs into the lungs every 6 (six) hours as needed for wheezing or shortness of breath.    Dispense:  18 g    Refill:  1    Follow-up: Return in about 3 months (around 11/05/2020) for Chronic disease management.       01/10/2020, MD, FAAFP. [facility] [place], Hoy Register [facility]   08/08/2020, 10:56 AM

## 2020-08-09 ENCOUNTER — Telehealth: Payer: Self-pay

## 2020-08-09 LAB — CYTOLOGY - PAP
Comment: NEGATIVE
Diagnosis: NEGATIVE
High risk HPV: NEGATIVE

## 2020-08-09 NOTE — Telephone Encounter (Signed)
-----   Message from Hoy Register, MD sent at 08/09/2020  2:34 PM EST ----- PAP smear is normal

## 2020-08-09 NOTE — Telephone Encounter (Signed)
Patient name and DOB has been verified Patient was informed of lab results. Patient had no questions.  

## 2020-08-16 MED FILL — PANTOPRAZOLE SOD DR 40 MG T: 40 | 30 days supply | Qty: 30 | Fill #7

## 2020-08-16 MED FILL — EZETIMIBE 10 MG TABS: 10 | 30 days supply | Qty: 30 | Fill #5

## 2020-08-16 MED FILL — ALBUTEROL SULFATE HFA 108 (: 108 (90 BAS | 25 days supply | Qty: 9 | Fill #0

## 2020-08-26 ENCOUNTER — Other Ambulatory Visit: Payer: Self-pay | Admitting: Family Medicine

## 2020-08-26 ENCOUNTER — Other Ambulatory Visit: Payer: Self-pay | Admitting: Pharmacist

## 2020-08-26 DIAGNOSIS — E1159 Type 2 diabetes mellitus with other circulatory complications: Secondary | ICD-10-CM

## 2020-09-05 MED FILL — ESCITALOPRAM 20 MG TABLET: 20 | 30 days supply | Qty: 30 | Fill #2

## 2020-09-09 ENCOUNTER — Other Ambulatory Visit: Payer: Self-pay | Admitting: Internal Medicine

## 2020-09-09 MED FILL — METOPROLOL SUCCINATE ER 25: 25 | 30 days supply | Qty: 30 | Fill #3

## 2020-09-09 MED FILL — FUROSEMIDE 20 MG TABS: 20 | 30 days supply | Qty: 30 | Fill #4

## 2020-09-09 MED FILL — LOSARTAN POTASSIUM 50 MG TA: 50 | 30 days supply | Qty: 30 | Fill #3

## 2020-09-09 MED FILL — ROSUVASTATIN CALCIUM 40 MG: 40 | 30 days supply | Qty: 30 | Fill #4

## 2020-09-10 MED FILL — CLOPIDOGREL 75 MG TABLET: 75 | 30 days supply | Qty: 30 | Fill #0

## 2020-09-14 ENCOUNTER — Other Ambulatory Visit: Payer: Self-pay

## 2020-09-19 ENCOUNTER — Other Ambulatory Visit: Payer: Self-pay

## 2020-09-19 ENCOUNTER — Telehealth: Payer: Self-pay | Admitting: Internal Medicine

## 2020-09-19 ENCOUNTER — Emergency Department (HOSPITAL_BASED_OUTPATIENT_CLINIC_OR_DEPARTMENT_OTHER)
Admission: EM | Admit: 2020-09-19 | Discharge: 2020-09-19 | Disposition: A | Discharge status: Home / Self Care | Payer: 59 | Attending: Emergency Medicine | Admitting: Emergency Medicine

## 2020-09-19 ENCOUNTER — Encounter (HOSPITAL_BASED_OUTPATIENT_CLINIC_OR_DEPARTMENT_OTHER): Payer: Self-pay | Admitting: Emergency Medicine

## 2020-09-19 ENCOUNTER — Emergency Department (HOSPITAL_BASED_OUTPATIENT_CLINIC_OR_DEPARTMENT_OTHER): Payer: 59

## 2020-09-19 ENCOUNTER — Ambulatory Visit
Admission: EM | Admit: 2020-09-19 | Discharge: 2020-09-19 | Disposition: A | Discharge status: Home / Self Care | Payer: 59 | Source: Home / Self Care

## 2020-09-19 ENCOUNTER — Ambulatory Visit: Payer: Self-pay | Admitting: *Deleted

## 2020-09-19 DIAGNOSIS — R42 Dizziness and giddiness: Secondary | ICD-10-CM

## 2020-09-19 DIAGNOSIS — Z7984 Long term (current) use of oral hypoglycemic drugs: Secondary | ICD-10-CM | POA: Diagnosis not present

## 2020-09-19 DIAGNOSIS — Z79899 Other long term (current) drug therapy: Secondary | ICD-10-CM | POA: Insufficient documentation

## 2020-09-19 DIAGNOSIS — R0602 Shortness of breath: Secondary | ICD-10-CM

## 2020-09-19 DIAGNOSIS — Z7902 Long term (current) use of antithrombotics/antiplatelets: Secondary | ICD-10-CM | POA: Diagnosis not present

## 2020-09-19 DIAGNOSIS — I252 Old myocardial infarction: Secondary | ICD-10-CM

## 2020-09-19 DIAGNOSIS — Z7982 Long term (current) use of aspirin: Secondary | ICD-10-CM | POA: Diagnosis not present

## 2020-09-19 DIAGNOSIS — I5021 Acute systolic (congestive) heart failure: Secondary | ICD-10-CM | POA: Insufficient documentation

## 2020-09-19 DIAGNOSIS — I2510 Atherosclerotic heart disease of native coronary artery without angina pectoris: Secondary | ICD-10-CM | POA: Insufficient documentation

## 2020-09-19 DIAGNOSIS — E119 Type 2 diabetes mellitus without complications: Secondary | ICD-10-CM | POA: Insufficient documentation

## 2020-09-19 DIAGNOSIS — R519 Headache, unspecified: Secondary | ICD-10-CM | POA: Diagnosis not present

## 2020-09-19 DIAGNOSIS — I110 Hypertensive heart disease with heart failure: Secondary | ICD-10-CM | POA: Diagnosis not present

## 2020-09-19 DIAGNOSIS — Z794 Long term (current) use of insulin: Secondary | ICD-10-CM | POA: Diagnosis not present

## 2020-09-19 LAB — COMPREHENSIVE METABOLIC PANEL
ALT: 20 U/L (ref 0–44)
AST: 17 U/L (ref 15–41)
Albumin: 4.2 g/dL (ref 3.5–5.0)
Alkaline Phosphatase: 52 U/L (ref 38–126)
Anion gap: 8 (ref 5–15)
BUN: 15 mg/dL (ref 8–23)
CO2: 31 mmol/L (ref 22–32)
Calcium: 9.5 mg/dL (ref 8.9–10.3)
Chloride: 102 mmol/L (ref 98–111)
Creatinine, Ser: 0.82 mg/dL (ref 0.44–1.00)
GFR, Estimated: >60 mL/min (ref >60)
Glucose, Bld: 99 mg/dL (ref 70–99)
Potassium: 4 mmol/L (ref 3.5–5.1)
Sodium: 141 mmol/L (ref 135–145)
Total Bilirubin: 0.4 mg/dL (ref 0.3–1.2)
Total Protein: 6.9 g/dL (ref 6.5–8.1)

## 2020-09-19 LAB — CBC WITH DIFFERENTIAL/PLATELET
Abs Immature Granulocytes: 0.01 10*3/uL (ref 0.00–0.07)
Basophils Absolute: 0 10*3/uL (ref 0.0–0.1)
Basophils Relative: 1 %
Eosinophils Absolute: 0.2 10*3/uL (ref 0.0–0.5)
Eosinophils Relative: 5 %
HCT: 44.8 % (ref 36.0–46.0)
Hemoglobin: 15 g/dL (ref 12.0–15.0)
Immature Granulocytes: 0 %
Lymphocytes Relative: 36 %
Lymphs Abs: 1.8 10*3/uL (ref 0.7–4.0)
MCH: 30.1 pg (ref 26.0–34.0)
MCHC: 33.5 g/dL (ref 30.0–36.0)
MCV: 90 fL (ref 80.0–100.0)
Monocytes Absolute: 0.5 10*3/uL (ref 0.1–1.0)
Monocytes Relative: 10 %
Neutro Abs: 2.4 10*3/uL (ref 1.7–7.7)
Neutrophils Relative %: 48 %
Platelets: 282 10*3/uL (ref 150–400)
RBC: 4.98 MIL/uL (ref 3.87–5.11)
RDW: 12.3 % (ref 11.5–15.5)
WBC: 4.9 10*3/uL (ref 4.0–10.5)
nRBC: 0 % (ref 0.0–0.2)

## 2020-09-19 LAB — TROPONIN I (HIGH SENSITIVITY): Troponin I (High Sensitivity): 6 ng/L (ref <18)

## 2020-09-19 MED ORDER — MECLIZINE HCL 25 MG PO TABS
25.0000 mg | ORAL_TABLET | Freq: Once | ORAL | Status: AC
  Administered 2020-09-19: 25 mg via ORAL
  Filled 2020-09-19: qty 1

## 2020-09-19 MED ORDER — MECLIZINE HCL 25 MG PO TABS: 25.0000 mg | ORAL_TABLET | Freq: Three times a day (TID) | ORAL | 0 refills | Status: DC | PRN

## 2020-09-19 MED ORDER — METOCLOPRAMIDE HCL 5 MG/ML IJ SOLN
10.0000 mg | Freq: Once | INTRAMUSCULAR | Status: AC
  Administered 2020-09-19: 10 mg via INTRAVENOUS
  Filled 2020-09-19: qty 2

## 2020-09-19 NOTE — Telephone Encounter (Signed)
Pt reports dizziness, onset this morning. States this AM's episode had to hold onto things to ambulate. Presently, dizziness with sitting to standing, quick movements, turning head. Reports SOB, onset yesterday. Denies cough, no congestion or earache, no new meds. BS this am 127. Does not check BP. Pt had MI last year, states "I still have 2 blockages." Advised ED per protocol. Pt states will alert cardiologist first, will go to ED if not able to reach.  Reason for Disposition . [1] Dizziness (vertigo) present now AND [2] one or more STROKE RISK FACTORS (i.e., hypertension, diabetes, prior stroke/TIA, heart attack)  (Exception: prior physician evaluation for this AND no different/worse than usual)  Answer Assessment - Initial Assessment Questions 1. DESCRIPTION: "Describe your dizziness."     Spinning a little 2. VERTIGO: "Do you feel like either you or the room is spinning or tilting?"      Spinning a bit, lightheadedness 3. LIGHTHEADED: "Do you feel lightheaded?" (e.g., somewhat faint, woozy, weak upon standing)     yes 4. SEVERITY: "How bad is it?"  "Can you walk?"   - MILD: Feels unsteady but walking normally.   - MODERATE: Feels very unsteady when walking, but not falling; interferes with normal activities (e.g., school, work) .   - SEVERE: Unable to walk without falling, or requires assistance to walk without falling.     moderate 5. ONSET:  "When did the dizziness begin?"     This am 6. AGGRAVATING FACTORS: "Does anything make it worse?" (e.g., standing, change in head position)     Sitting to standing, turning head. 7. CAUSE: "What do you think is causing the dizziness?"     Unsure 8. RECURRENT SYMPTOM: "Have you had dizziness before?" If Yes, ask: "When was the last time?" "What happened that time?"     no 9. OTHER SYMPTOMS: "Do you have any other symptoms?" (e.g., headache, weakness, numbness, vomiting, earache)     SOB since yesterday  Protocols used: DIZZINESS -  VERTIGO-A-AH

## 2020-09-19 NOTE — Discharge Instructions (Addendum)
GO TO ER °

## 2020-09-19 NOTE — ED Provider Notes (Signed)
[facility] EMERGENCY DEPT Provider Note   CSN: [id number] Arrival date & time: 09/19/20  1734     History Chief Complaint  Patient presents with  . Dizziness    Nicole Barry is a 65 y.o. female.  Patient is a 65 year old female with a history of CAD status post stent 1 year ago, ischemic cardiomyopathy, hyperlipidemia, diabetes, CHF, NSTEMI, hypertension who is presenting today with complaints of feeling lightheaded/dizzy and having a headache.  Patient reports that her symptoms started this morning.  She went to get out of bed and she reports feeling lightheaded.  She describes this as woozy and like she might pass out.  She reports she was kind of walking a little bit to the left but that resolved after getting to work.  The dizziness is present prominently when she tries to stand and walk but is there slightly when she moves her head or looks around.  She does have 1 prior history of vertigo but reports significant nausea with that and this feels slightly different.  After the dizziness started she developed a headache in the frontal portion of her head.  She has had no nausea or vomiting.  Today while having the symptoms she denies any chest pain, shortness of breath, palpitations, abdominal pain.  She has not had recent fever or URI symptoms.  Patient does report that on Sunday she had an episode of chest pain that lasted approximately 1 hour that felt like a tightness in her chest and made it difficult to take a deep breath.  This resolved after taking 1 nitroglycerin.  She felt that it was related to stress because her mother has stage IV lung cancer and she was very upset and trying not to cry the whole time she was there.  She has had no chest pain since.  She went to urgent care today for her symptoms because she was unable to see her doctor and they sent her here for further evaluation.   Dizziness      Past Medical History:  Diagnosis Date  . Acute diverticulitis  05/17/2014  . Anxiety   . CAD (coronary artery disease)   . Depression   . Diverticulitis 05/17/2014  . HTN (hypertension)   . Hyperlipidemia LDL goal <70   . Non-ST elevation (NSTEMI) myocardial infarction (HCC) 12/05/2019  . Normocytic anemia 05/19/2014  . NSTEMI (non-ST elevated myocardial infarction) (HCC)    12/05/19: DES-LAD  . Sepsis (HCC) 05/19/2014  . Type 2 diabetes mellitus with hyperosmolarity without coma, without long-term current use of insulin (HCC)   . Unstable angina (HCC) 12/05/2019    Patient Active Problem List   Diagnosis Date Noted  . CAD S/P percutaneous coronary angioplasty 01/11/2020  . Ischemic cardiomyopathy 01/11/2020  . Obesity (BMI 30-39.9) 01/11/2020  . Anxiety   . Hyperlipidemia LDL goal <70 12/07/2019  . Status post coronary artery stent placement   . Diabetes mellitus (HCC)   . Acute systolic heart failure (HCC)   . Unstable angina (HCC) 12/05/2019  . NSTEMI (non-ST elevated myocardial infarction) (HCC) 12/05/2019  . Sepsis (HCC) 05/19/2014  . Normocytic anemia 05/19/2014  . Depression 05/19/2014  . Acute diverticulitis 05/17/2014  . Hypertension 05/17/2014  . Diverticulitis 05/17/2014    Past Surgical History:  Procedure Laterality Date  . BREAST SURGERY    . CESAREAN SECTION    . CORONARY STENT INTERVENTION N/A 12/05/2019   Procedure: CORONARY STENT INTERVENTION;  Surgeon: Kathleene Hazel, MD;  Location: [facility] INVASIVE CV  LAB;  Service: Cardiovascular;  Laterality: N/A;  . LEFT HEART CATH AND CORONARY ANGIOGRAPHY N/A 12/05/2019   Procedure: LEFT HEART CATH AND CORONARY ANGIOGRAPHY;  Surgeon: Kathleene Hazel, MD;  Location: [facility] INVASIVE CV LAB;  Service: Cardiovascular;  Laterality: N/A;  . rotator cull surg     lt shoulder     OB History   No obstetric history on file.     Family History  Problem Relation Age of Onset  . Heart attack Father   . Hypertension Sister   . Heart attack Brother   . Diabetes Sister   . Colon  cancer Neg Hx     Social History   Tobacco Use  . Smoking status: Never Smoker  . Smokeless tobacco: Never Used  Substance Use Topics  . Alcohol use: Yes    Comment: occasional  . Drug use: No    Home Medications Prior to Admission medications   Medication Sig Start Date End Date Taking? Authorizing Provider  albuterol (VENTOLIN HFA) 108 (90 Base) MCG/ACT inhaler INHALE 2 PUFFS INTO THE LUNGS EVERY 6 (SIX) HOURS AS NEEDED FOR WHEEZING OR SHORTNESS OF BREATH. 08/08/20 08/08/21  Hoy Register, MD  albuterol (VENTOLIN HFA) 108 (90 Base) MCG/ACT inhaler INHALE 2 PUFFS INTO THE LUNGS EVERY 6 (SIX) HOURS AS NEEDED FOR WHEEZING OR SHORTNESS OF BREATH. 08/08/20 08/08/21  Hoy Register, MD  aspirin EC 81 MG EC tablet Take 1 tablet (81 mg total) by mouth daily. Swallow whole. 12/07/19   Duke, Roe Rutherford, PA  clopidogrel (PLAVIX) 75 MG tablet TAKE 1 TABLET (75 MG TOTAL) BY MOUTH DAILY. 09/09/20 09/09/21  Hilty, Lisette Abu, MD  Dulaglutide 1.5 MG/0.5ML SOPN INJECT 1.5 MG INTO THE SKIN ONCE A WEEK. 08/26/20 08/26/21  Hoy Register, MD  empagliflozin (JARDIANCE) 10 MG TABS tablet Take 1 tablet (10 mg total) by mouth daily before breakfast. 04/10/20   Newlin, Odette Horns, MD  escitalopram (LEXAPRO) 20 MG tablet TAKE 1 TABLET (20 MG TOTAL) BY MOUTH DAILY. 04/10/20 04/10/21  Hoy Register, MD  ezetimibe (ZETIA) 10 MG tablet TAKE 1 TABLET (10 MG TOTAL) BY MOUTH DAILY. 03/26/20 03/26/21  Hilty, Lisette Abu, MD  fluticasone (FLONASE) 50 MCG/ACT nasal spray Place 1 spray into both nostrils daily.    [provider]  furosemide (LASIX) 20 MG tablet Take 1 tablet (20 mg total) by mouth daily. 05/07/20   Hilty, Lisette Abu, MD  furosemide (LASIX) 20 MG tablet TAKE 1 TABLET (20 MG TOTAL) BY MOUTH DAILY. 12/07/19 12/06/20  Marcelino Duster, PA  hydrOXYzine (ATARAX/VISTARIL) 25 MG tablet TAKE 1 TABLET (25 MG TOTAL) BY MOUTH 3 (THREE) TIMES DAILY AS NEEDED. 04/10/20 04/10/21  Hoy Register, MD  loratadine  (CLARITIN) 10 MG tablet Take 10 mg by mouth daily.    [provider]  losartan (COZAAR) 50 MG tablet TAKE 1 TABLET (50 MG TOTAL) BY MOUTH DAILY. 04/10/20 04/10/21  Hoy Register, MD  metoprolol succinate (TOPROL-XL) 25 MG 24 hr tablet TAKE 1 TABLET (25 MG TOTAL) BY MOUTH DAILY. 04/03/20 04/03/21  Hilty, Lisette Abu, MD  nitroGLYCERIN (NITROSTAT) 0.4 MG SL tablet Place 1 tablet (0.4 mg total) under the tongue every 5 (five) minutes as needed for chest pain. 12/07/19 12/06/20  Marcelino Duster, PA  pantoprazole (PROTONIX) 40 MG tablet Take 1 tablet (40 mg total) by mouth daily. 04/10/20   Hoy Register, MD  pantoprazole (PROTONIX) 40 MG tablet TAKE 1 TABLET (40 MG TOTAL) BY MOUTH DAILY. 12/07/19 12/06/20  Marcelino Duster, PA  rosuvastatin (CRESTOR) 40 MG tablet TAKE 1 TABLET (40 MG TOTAL) BY MOUTH DAILY. 05/08/20 05/08/21  Hoy RegisterNewlin, Enobong, MD  atorvastatin (LIPITOR) 80 MG tablet Take 1 tablet (80 mg total) by mouth daily. 04/10/20 05/08/20  Hoy RegisterNewlin, Enobong, MD    Allergies    Sulfa antibiotics  Review of Systems   Review of Systems  Neurological: Positive for dizziness.  All other systems reviewed and are negative.   Physical Exam Updated Vital Signs BP (!) 152/67 (BP Location: Right Arm)   Pulse 62   Temp 98.5 F (36.9 C) (Oral)   Resp 18   Ht 4\' 10"  (1.473 m)   Wt 86.2 kg   SpO2 96%   BMI 39.71 kg/m   Physical Exam Vitals and nursing note reviewed.  Constitutional:      General: She is not in acute distress.    Appearance: Normal appearance. She is well-developed.  HENT:     Head: Normocephalic and atraumatic.     Right Ear: Tympanic membrane normal.     Left Ear: Tympanic membrane normal.     Mouth/Throat:     Mouth: Mucous membranes are moist.  Eyes:     Extraocular Movements: Extraocular movements intact.     Conjunctiva/sclera: Conjunctivae normal.     Pupils: Pupils are equal, round, and reactive to light.     Comments: No nystagmus  Neck:      Vascular: No carotid bruit.  Cardiovascular:     Rate and Rhythm: Normal rate and regular rhythm.     Pulses: Normal pulses.     Heart sounds: No murmur heard.   Pulmonary:     Effort: Pulmonary effort is normal. No respiratory distress.     Breath sounds: Normal breath sounds. No wheezing or rales.  Abdominal:     General: There is no distension.     Palpations: Abdomen is soft.     Tenderness: There is no abdominal tenderness. There is no guarding or rebound.  Musculoskeletal:        General: No tenderness. Normal range of motion.     Cervical back: Normal range of motion and neck supple.     Right lower leg: No edema.     Left lower leg: No edema.  Skin:    General: Skin is warm and dry.     Findings: No erythema or rash.  Neurological:     Mental Status: She is alert and oriented to person, place, and time. Mental status is at baseline.     Sensory: No sensory deficit.     Motor: No weakness or pronator drift.     Coordination: Coordination is intact. Coordination normal. Finger-Nose-Finger Test and [facility]el [facility] normal.     Gait: Gait is intact. Gait normal.  Psychiatric:        Behavior: Behavior normal.     ED Results / Procedures / Treatments   Labs (all labs ordered are listed, but only abnormal results are displayed) Labs Reviewed  CBC WITH DIFFERENTIAL/PLATELET  COMPREHENSIVE METABOLIC PANEL  TROPONIN I (HIGH SENSITIVITY)  TROPONIN I (HIGH SENSITIVITY)    EKG None  Radiology CT Head Wo Contrast  Result Date: 09/19/2020 CLINICAL DATA:  Right-sided headache and dizziness. EXAM: CT HEAD WITHOUT CONTRAST TECHNIQUE: Contiguous axial images were obtained from the base of the skull through the vertex without intravenous contrast. COMPARISON:  None. FINDINGS: Brain: No evidence of acute infarction, hemorrhage, hydrocephalus, extra-axial collection or mass lesion/mass effect. Vascular: No hyperdense vessel or unexpected  calcification. Skull: Normal. Negative for  fracture or focal lesion. Sinuses/Orbits: No acute finding. Other: None. IMPRESSION: No acute intracranial pathology. Electronically Signed   By: Aram Candela M.D.   On: 09/19/2020 19:04   DG Chest Port 1 View  Result Date: 09/19/2020 CLINICAL DATA:  Dizziness EXAM: PORTABLE CHEST 1 VIEW COMPARISON:  12/05/2019 FINDINGS: Heart and mediastinal contours are within normal limits. No focal opacities or effusions. No acute bony abnormality. IMPRESSION: No active disease. Electronically Signed   By: Charlett Nose M.D.   On: 09/19/2020 19:15    Procedures Procedures   Medications Ordered in ED Medications  metoCLOPramide (REGLAN) injection 10 mg (has no administration in time range)  meclizine (ANTIVERT) tablet 25 mg (has no administration in time range)    ED Course  I have reviewed the triage vital signs and the nursing notes.  Pertinent labs & imaging results that were available during my care of the patient were reviewed by me and considered in my medical decision making (see chart for details).    MDM Rules/Calculators/A&P                          Patient is a 65 year old female presenting today with nonspecific dizziness.  The dizziness is only present when attempting to walk and sometimes when moving her head.  It is possible that this is peripheral vertigo as patient has a normal blood pressure and does not appear to be orthostatic.  Low suspicion that patient is having a vertebral dissection and she has no obvious neurologic signs on exam consistent with stroke.  Her gait is purposeful and no evidence of ataxia at this time.  Also could be complex headache.  Because of patient's episode of chest pain and significant cardiac history we will check EKG, troponin however she has not had any pain since Sunday.  Patient given meclizine and Reglan for headache.  Labs, imaging and EKG are pending.  8:18 PM Labs and imaging are wnl.  No orthostatics.  Pt improved with meds and walked to the  bathroom wihtout difficulty. Trop neg at 6 and EKG reassuring.  Will discharge home and patient given return precautions  MDM Number of Diagnoses or Management Options   Amount and/or Complexity of Data Reviewed Clinical lab tests: ordered and reviewed Tests in the radiology section of CPT: ordered and reviewed Tests in the medicine section of CPT: ordered and reviewed Decide to obtain previous medical records or to obtain history from someone other than the patient: yes Obtain history from someone other than the patient: no Review and summarize past medical records: yes Independent visualization of images, tracings, or specimens: yes  Risk of Complications, Morbidity, and/or Mortality Presenting problems: moderate Diagnostic procedures: moderate Management options: low  Patient Progress Patient progress: improved    Final Clinical Impression(s) / ED Diagnoses Final diagnoses:  Vertigo    Rx / DC Orders ED Discharge Orders         Ordered    meclizine (ANTIVERT) 25 MG tablet  3 times daily PRN        04 /07/22 2020           [id number], MD 09/19/20 2021

## 2020-09-19 NOTE — Telephone Encounter (Signed)
STAT if patient feels like he/she is going to faint   1) Are you dizzy now?not at this time, it comes and go  2) Do you feel faint or have you passed out? Felt like she was going to pass out  3) Do you have any other symptoms? Yesterday short of breath, not short of breath today, lightheaded--feel like she is walking sideway  4) Have you checked your HR and BP (record if available)? Have not checked blood pressure, she does not have a cuff- pt wants to be seen by somebody today

## 2020-09-19 NOTE — ED Triage Notes (Signed)
Pt states has been under a lot of stress and had pressure to center of chest on Sunday. States hx of MI a year ago with known 2 blockages. Pt states today having dizziness and headaches. Denies any chest pain since Sunday. Denies SOB

## 2020-09-19 NOTE — ED Notes (Signed)
Patient transported to X-ray & CT °

## 2020-09-19 NOTE — Telephone Encounter (Signed)
Spoke to patient . She states she hade  The episode this morning.  She did indicate when she woke up this morning and going to bathroom she felt as if she was waking side ways.   She also states when turn her head to answer someone she felt dizzy. She states she does not have a cold   No sinus issues . She has been taken allergy medicine because she has allergies ,but no sinus congestion. Patient states she contacted primary recommend  Seeking ER attention.  RN recommend the same- or trying a Urgent care. Patient states she monitor her condition if it becomes worse seek out  Medical attention

## 2020-09-19 NOTE — Discharge Instructions (Signed)
If you start having severe crippling headache, vomiting that is uncontrolled, inability to walk or weakness on one side of your body please return to the emergency room immediately for evaluation.  All your lab work, EKG and images today were reassuring.

## 2020-09-19 NOTE — ED Provider Notes (Signed)
[facility]    CSN: [id number] Arrival date & time: 09/19/20  1606      History   Chief Complaint Chief Complaint  Patient presents with  . Dizziness  . Chest Pain    HPI Nicole Barry is a 66 y.o. female.   Nicole Barry presents today accompanied by her sister. She had an episodes of chest pain on Sunday 09/15/2020 which she described as a crushing/ pressure. She took a nitroglycerine and this improved. She assumed this is related to anxiety as she was visiting her mother who has stage 4 lung cancer and is in hospice when symptoms started. She has not had chest pain since that time. She has had intermittent shortness of breath, headache, and lightheadedness. Reports waking up this morning and feeling as though she is going to pass out but denies any syncope. She denies any recent illness, medication changes, recent head injury. She has a history of NSTEMI and reports she had atypical symptoms with that episode. She has a history of diabetes and reports fasting blood sugar was normal this morning at 127. She has a history of hyperlipidemia, hypertension, and family history of heart disease. She denies history of tobacco use.      Past Medical History:  Diagnosis Date  . Acute diverticulitis 05/17/2014  . Anxiety   . CAD (coronary artery disease)   . Depression   . Diverticulitis 05/17/2014  . HTN (hypertension)   . Hyperlipidemia LDL goal <70   . Non-ST elevation (NSTEMI) myocardial infarction (HCC) 12/05/2019  . Normocytic anemia 05/19/2014  . NSTEMI (non-ST elevated myocardial infarction) (HCC)    12/05/19: DES-LAD  . Sepsis (HCC) 05/19/2014  . Type 2 diabetes mellitus with hyperosmolarity without coma, without long-term current use of insulin (HCC)   . Unstable angina (HCC) 12/05/2019    Patient Active Problem List   Diagnosis Date Noted  . CAD S/P percutaneous coronary angioplasty 01/11/2020  . Ischemic cardiomyopathy 01/11/2020  . Obesity (BMI 30-39.9) 01/11/2020  .  Anxiety   . Hyperlipidemia LDL goal <70 12/07/2019  . Status post coronary artery stent placement   . Diabetes mellitus (HCC)   . Acute systolic heart failure (HCC)   . Unstable angina (HCC) 12/05/2019  . NSTEMI (non-ST elevated myocardial infarction) (HCC) 12/05/2019  . Sepsis (HCC) 05/19/2014  . Normocytic anemia 05/19/2014  . Depression 05/19/2014  . Acute diverticulitis 05/17/2014  . Hypertension 05/17/2014  . Diverticulitis 05/17/2014    Past Surgical History:  Procedure Laterality Date  . BREAST SURGERY    . CESAREAN SECTION    . CORONARY STENT INTERVENTION N/A 12/05/2019   Procedure: CORONARY STENT INTERVENTION;  Surgeon: Kathleene Hazel, MD;  Location: [facility];  Service: Cardiovascular;  Laterality: N/A;  . LEFT HEART CATH AND CORONARY ANGIOGRAPHY N/A 12/05/2019   Procedure: LEFT HEART CATH AND CORONARY ANGIOGRAPHY;  Surgeon: Kathleene Hazel, MD;  Location: [facility];  Service: Cardiovascular;  Laterality: N/A;  . rotator cull surg     lt shoulder    OB History   No obstetric history on file.      Home Medications    Prior to Admission medications   Medication Sig Start Date End Date Taking? Authorizing Provider  albuterol (VENTOLIN HFA) 108 (90 Base) MCG/ACT inhaler INHALE 2 PUFFS INTO THE LUNGS EVERY 6 (SIX) HOURS AS NEEDED FOR WHEEZING OR SHORTNESS OF BREATH. 08/08/20 08/08/21  Hoy Register, MD  albuterol (VENTOLIN HFA) 108 (90 Base) MCG/ACT inhaler  INHALE 2 PUFFS INTO THE LUNGS EVERY 6 (SIX) HOURS AS NEEDED FOR WHEEZING OR SHORTNESS OF BREATH. 08/08/20 08/08/21  Hoy RegisterNewlin, Enobong, MD  aspirin EC 81 MG EC tablet Take 1 tablet (81 mg total) by mouth daily. Swallow whole. 12/07/19   [facility], Roe RutherfordAngela Nicole, PA  clopidogrel (PLAVIX) 75 MG tablet TAKE 1 TABLET (75 MG TOTAL) BY MOUTH DAILY. 09/09/20 09/09/21  Hilty, Lisette AbuKenneth C, MD  Dulaglutide 1.5 MG/0.5ML SOPN INJECT 1.5 MG INTO THE SKIN ONCE A WEEK. 08/26/20 08/26/21  Hoy RegisterNewlin, Enobong, MD   empagliflozin (JARDIANCE) 10 MG TABS tablet Take 1 tablet (10 mg total) by mouth daily before breakfast. 04/10/20   Newlin, Odette HornsEnobong, MD  escitalopram (LEXAPRO) 20 MG tablet TAKE 1 TABLET (20 MG TOTAL) BY MOUTH DAILY. 04/10/20 04/10/21  Hoy RegisterNewlin, Enobong, MD  ezetimibe (ZETIA) 10 MG tablet TAKE 1 TABLET (10 MG TOTAL) BY MOUTH DAILY. 03/26/20 03/26/21  Hilty, Lisette AbuKenneth C, MD  fluticasone (FLONASE) 50 MCG/ACT nasal spray Place 1 spray into both nostrils daily.    [provider]  furosemide (LASIX) 20 MG tablet Take 1 tablet (20 mg total) by mouth daily. 05/07/20   Hilty, Lisette AbuKenneth C, MD  furosemide (LASIX) 20 MG tablet TAKE 1 TABLET (20 MG TOTAL) BY MOUTH DAILY. 12/07/19 12/06/20  Marcelino Dusteruke, Angela Nicole, PA  hydrOXYzine (ATARAX/VISTARIL) 25 MG tablet TAKE 1 TABLET (25 MG TOTAL) BY MOUTH 3 (THREE) TIMES DAILY AS NEEDED. 04/10/20 04/10/21  Hoy RegisterNewlin, Enobong, MD  loratadine (CLARITIN) 10 MG tablet Take 10 mg by mouth daily.    [provider]  losartan (COZAAR) 50 MG tablet TAKE 1 TABLET (50 MG TOTAL) BY MOUTH DAILY. 04/10/20 04/10/21  Hoy RegisterNewlin, Enobong, MD  metoprolol succinate (TOPROL-XL) 25 MG 24 hr tablet TAKE 1 TABLET (25 MG TOTAL) BY MOUTH DAILY. 04/03/20 04/03/21  Hilty, Lisette AbuKenneth C, MD  nitroGLYCERIN (NITROSTAT) 0.4 MG SL tablet Place 1 tablet (0.4 mg total) under the tongue every 5 (five) minutes as needed for chest pain. 12/07/19 12/06/20  Marcelino Dusteruke, Angela Nicole, PA  pantoprazole (PROTONIX) 40 MG tablet Take 1 tablet (40 mg total) by mouth daily. 04/10/20   Hoy RegisterNewlin, Enobong, MD  pantoprazole (PROTONIX) 40 MG tablet TAKE 1 TABLET (40 MG TOTAL) BY MOUTH DAILY. 12/07/19 12/06/20  Marcelino Dusteruke, Angela Nicole, PA  rosuvastatin (CRESTOR) 40 MG tablet TAKE 1 TABLET (40 MG TOTAL) BY MOUTH DAILY. 05/08/20 05/08/21  Hoy RegisterNewlin, Enobong, MD  atorvastatin (LIPITOR) 80 MG tablet Take 1 tablet (80 mg total) by mouth daily. 04/10/20 05/08/20  Hoy RegisterNewlin, Enobong, MD    Family History Family History  Problem Relation Age of  Onset  . Heart attack Father   . Hypertension Sister   . Heart attack Brother   . Diabetes Sister   . Colon cancer Neg Hx     Social History Social History   Tobacco Use  . Smoking status: Never Smoker  . Smokeless tobacco: Never Used  Substance Use Topics  . Alcohol use: Yes    Comment: occasional  . Drug use: No     Allergies   Sulfa antibiotics   Review of Systems Review of Systems  Constitutional: Positive for activity change. Negative for appetite change, fatigue and fever.  HENT: Negative for congestion, sinus pressure, sneezing and sore throat.   Eyes: Negative for visual disturbance.  Respiratory: Positive for shortness of breath. Negative for cough.   Cardiovascular: Positive for chest pain.  Gastrointestinal: Negative for abdominal pain, diarrhea, nausea and vomiting.  Musculoskeletal: Negative for arthralgias and myalgias.  Neurological: Positive  for light-headedness and headaches. Negative for dizziness and weakness.     Physical Exam Triage Vital Signs ED Triage Vitals  Enc Vitals Group     BP 09/19/20 1636 (!) 150/86     Pulse Rate 09/19/20 1636 64     Resp 09/19/20 1636 18     Temp 09/19/20 1636 97.9 F (36.6 C)     Temp Source 09/19/20 1636 Oral     SpO2 09/19/20 1636 96 %     Weight --      Height --      Head Circumference --      Peak Flow --      Pain Score 09/19/20 1637 0     Pain Loc --      Pain Edu? --      Excl. in GC? --    Orthostatic VS for the past 24 hrs:  BP- Lying Pulse- Lying BP- Sitting Pulse- Sitting BP- Standing at 0 minutes Pulse- Standing at 0 minutes  09/19/20 1653 117/71 63 134/66 63 134/82 62    Updated Vital Signs BP (!) 150/86 (BP Location: Left Arm)   Pulse 64   Temp 97.9 F (36.6 C) (Oral)   Resp 18   SpO2 96%   Visual Acuity Right Eye Distance:   Left Eye Distance:   Bilateral Distance:    Right Eye Near:   Left Eye Near:    Bilateral Near:     Physical Exam Vitals reviewed.   Constitutional:      General: She is awake. She is not in acute distress.    Appearance: Normal appearance. She is not ill-appearing.     Comments: Very pleasant female appears stated age in no acute distress.  HENT:     Head: Normocephalic and atraumatic.     Right Ear: Tympanic membrane, ear canal and external ear normal. Tympanic membrane is not erythematous or bulging.     Left Ear: Tympanic membrane, ear canal and external ear normal. Tympanic membrane is not erythematous or bulging.     Nose: Nose normal.     Mouth/Throat:     Mouth: Mucous membranes are moist.     Tongue: Tongue does not deviate from midline.     Pharynx: Uvula midline. No oropharyngeal exudate or posterior oropharyngeal erythema.  Eyes:     Extraocular Movements: Extraocular movements intact.     Pupils: Pupils are equal, round, and reactive to light.  Cardiovascular:     Rate and Rhythm: Normal rate and regular rhythm.     Heart sounds: No murmur heard.   Pulmonary:     Effort: Pulmonary effort is normal.     Breath sounds: Normal breath sounds. No wheezing, rhonchi or rales.  Musculoskeletal:     Right lower leg: No edema.     Left lower leg: No edema.     Comments: Strength 5/5 bilateral upper and lower extremities.   Lymphadenopathy:     Head:     Right side of head: No submental, submandibular or tonsillar adenopathy.     Left side of head: No submental, submandibular or tonsillar adenopathy.  Neurological:     General: No focal deficit present.     Cranial Nerves: Cranial nerves are intact.     Motor: Motor function is intact.     Coordination: Coordination is intact.     Gait: Gait is intact.     Comments: Cranial nerves II-XII intact. No focal neurological defect on exam.   Psychiatric:  Behavior: Behavior is cooperative.      UC Treatments / Results  Labs (all labs ordered are listed, but only abnormal results are displayed) Labs Reviewed - No data to  display  EKG   Radiology No results found.  Procedures Procedures (including critical care time)  Medications Ordered in UC Medications - No data to display  Initial Impression / Assessment and Plan / UC Course  I have reviewed the triage vital signs and the nursing notes.  Pertinent labs & imaging results that were available during my care of the patient were reviewed by me and considered in my medical decision making (see chart for details).      EKG showed sinus bradycardia with ventricular rate of 59 bpm, no acute changes, non-specific ST changes in V2 compared to 03/26/2020 tracing. Orthostatics normal. Discussed need for emergent evaluation to rule out more serious etiology such as NSTEMI which is not possible in limited resource clinic. Patient was agreeable and will have her sister take her to ER immediately following visit. Patient was hemodynamically stable and safe for private transport at the time of discharge.   Final Clinical Impressions(s) / UC Diagnoses   Final diagnoses:  Lightheadedness  Shortness of breath  History of non-ST elevation myocardial infarction (NSTEMI)  Nonintractable headache, unspecified chronicity pattern, unspecified headache type     Discharge Instructions     GO TO ER.     ED Prescriptions    None     PDMP not reviewed this encounter.   Jeani Hawking, PA-C 09/19/20 1721

## 2020-09-19 NOTE — ED Triage Notes (Signed)
Pt arrives pov with c/o right side HA and dizziness upon waking today. Pt denies numbness, or tingling, pt AO x 4. Endorses hx of MI this past June, 2021. Pt denies CP, shob, deneis N/V

## 2020-09-26 ENCOUNTER — Other Ambulatory Visit: Payer: Self-pay

## 2020-09-26 MED FILL — Dulaglutide Soln Auto-injector 1.5 MG/0.5ML: SUBCUTANEOUS | 28 days supply | Qty: 2 | Fill #0 | Status: CN

## 2020-09-26 MED FILL — Ezetimibe Tab 10 MG: ORAL | 30 days supply | Qty: 30 | Fill #0 | Status: AC

## 2020-09-26 MED FILL — Pantoprazole Sodium EC Tab 40 MG (Base Equiv): ORAL | 30 days supply | Qty: 30 | Fill #0 | Status: AC

## 2020-09-30 ENCOUNTER — Other Ambulatory Visit: Payer: Self-pay

## 2020-09-30 MED FILL — Dulaglutide Soln Auto-injector 1.5 MG/0.5ML: SUBCUTANEOUS | 28 days supply | Qty: 2 | Fill #0 | Status: AC

## 2020-09-30 MED FILL — Dulaglutide Soln Auto-injector 1.5 MG/0.5ML: SUBCUTANEOUS | 28 days supply | Qty: 2 | Fill #0 | Status: CN

## 2020-10-01 ENCOUNTER — Other Ambulatory Visit: Payer: Self-pay

## 2020-10-06 MED FILL — Rosuvastatin Calcium Tab 40 MG: ORAL | 30 days supply | Qty: 30 | Fill #0 | Status: AC

## 2020-10-06 MED FILL — Furosemide Tab 20 MG: ORAL | 30 days supply | Qty: 30 | Fill #0 | Status: AC

## 2020-10-06 MED FILL — Losartan Potassium Tab 50 MG: ORAL | 30 days supply | Qty: 30 | Fill #0 | Status: AC

## 2020-10-07 ENCOUNTER — Other Ambulatory Visit: Payer: Self-pay

## 2020-10-15 MED FILL — Metoprolol Succinate Tab ER 24HR 25 MG (Tartrate Equiv): ORAL | 30 days supply | Qty: 30 | Fill #0 | Status: AC

## 2020-10-15 MED FILL — Clopidogrel Bisulfate Tab 75 MG (Base Equiv): ORAL | 30 days supply | Qty: 30 | Fill #0 | Status: AC

## 2020-10-16 ENCOUNTER — Other Ambulatory Visit: Payer: Self-pay

## 2020-10-21 ENCOUNTER — Other Ambulatory Visit: Payer: Self-pay

## 2020-10-21 MED ORDER — PEG 3350-KCL-NA BICARB-NACL 420 G PO SOLR
4000.0000 mL | Freq: Once | ORAL | 0 refills | Status: AC
  Filled 2020-12-19: qty 4000, 1d supply, fill #0

## 2020-10-28 ENCOUNTER — Telehealth: Payer: Self-pay | Admitting: *Deleted

## 2020-10-28 NOTE — Telephone Encounter (Signed)
   [facility] Pre-operative Risk Assessment    Patient Name: Nicole Barry  DOB: 1955/06/18  MRN: [id number]   HEARTCARE STAFF: - Please ensure there is not already an duplicate clearance open for this procedure. - Under Visit Info/Reason for Call, type in Other and utilize the format Clearance MM/DD/YY or Clearance TBD. Do not use dashes or single digits. - If request is for dental extraction, please clarify the # of teeth to be extracted.  Request for surgical clearance:  1. What type of surgery is being performed? colonoscopy   2. When is this surgery scheduled? 02/14/21   3. What type of clearance is required (medical clearance vs. Pharmacy clearance to hold med vs. Both)? pharmacy  4. Are there any medications that need to be held prior to surgery and how long? Plavix   5. Practice name and name of physician performing surgery? [facility] Dr. Paulita Fujita   6. What is the office phone number? [phone]   7.   What is the office fax number? [phone]  8.   Anesthesia type (None, local, MAC, general) ? propofol   Santrice Muzio A Jaspreet Bodner 10/28/2020, 11:27 AM  _________________________________________________________________   (provider comments below)

## 2020-10-28 NOTE — Telephone Encounter (Signed)
S/w pt and she is agreeable to appt for pre op clearance as well as pt recently seen in ED for cp and dizziness. Pt has been scheduled to see Dr. Rennis Golden 10/30/20 @ 2:45. Pt is grateful for the help and the call. I will send notes to requesting office as FYI.

## 2020-10-28 NOTE — Telephone Encounter (Signed)
   Name: Nicole Barry  DOB: 03-Sep-1955  MRN: [id number]  Primary Cardiologist: Chrystie Nose, MD  Chart reviewed as part of pre-operative protocol coverage. Because of EMELY FAHY past medical history and time since last visit, she will require a follow-up visit in order to better assess preoperative cardiovascular risk.  Pt had recent ER visit for dizziness and chest tightness. She is scheduled for colonoscopy in Sept. We have been asked for preop clearance and plavix hold. When I spoke with her, she also reports significant fatigue and feeling "off." PCI in June 2021.   Pre-op covering staff: - Please schedule appointment and call patient to inform them. If patient already had an upcoming appointment within acceptable timeframe, please add "pre-op clearance" to the appointment notes so provider is aware. - Please contact requesting surgeon's office via preferred method (i.e, phone, fax) to inform them of need for appointment prior to surgery.  If applicable, this message will also be routed to pharmacy pool and/or primary cardiologist for input on holding anticoagulant/antiplatelet agent as requested below so that this information is available to the clearing provider at time of patient's appointment.   Roe Rutherford Micole Delehanty, PA  10/28/2020, 1:19 PM

## 2020-10-30 ENCOUNTER — Other Ambulatory Visit: Payer: Self-pay

## 2020-10-30 ENCOUNTER — Ambulatory Visit: Payer: 59 | Admitting: Internal Medicine

## 2020-10-31 MED FILL — Furosemide Tab 20 MG: ORAL | 30 days supply | Qty: 30 | Fill #1 | Status: AC

## 2020-10-31 MED FILL — Dulaglutide Soln Auto-injector 1.5 MG/0.5ML: SUBCUTANEOUS | 28 days supply | Qty: 2 | Fill #1 | Status: AC

## 2020-10-31 MED FILL — Rosuvastatin Calcium Tab 40 MG: ORAL | 30 days supply | Qty: 30 | Fill #1 | Status: AC

## 2020-10-31 MED FILL — Ezetimibe Tab 10 MG: ORAL | 30 days supply | Qty: 30 | Fill #1 | Status: AC

## 2020-10-31 MED FILL — Pantoprazole Sodium EC Tab 40 MG (Base Equiv): ORAL | 30 days supply | Qty: 30 | Fill #1 | Status: AC

## 2020-10-31 MED FILL — Losartan Potassium Tab 50 MG: ORAL | 30 days supply | Qty: 30 | Fill #1 | Status: AC

## 2020-11-01 ENCOUNTER — Other Ambulatory Visit: Payer: Self-pay

## 2020-11-05 ENCOUNTER — Other Ambulatory Visit: Payer: Self-pay

## 2020-11-05 ENCOUNTER — Ambulatory Visit: Payer: 59 | Attending: Family Medicine | Admitting: Family Medicine

## 2020-11-05 ENCOUNTER — Encounter: Payer: Self-pay | Admitting: Family Medicine

## 2020-11-05 VITALS — BP 131/74 | HR 68 | Ht <= 58 in | Wt 199.2 lb

## 2020-11-05 DIAGNOSIS — F419 Anxiety disorder, unspecified: Secondary | ICD-10-CM | POA: Diagnosis not present

## 2020-11-05 DIAGNOSIS — Z23 Encounter for immunization: Secondary | ICD-10-CM | POA: Diagnosis not present

## 2020-11-05 DIAGNOSIS — F32A Depression, unspecified: Secondary | ICD-10-CM

## 2020-11-05 DIAGNOSIS — E1159 Type 2 diabetes mellitus with other circulatory complications: Secondary | ICD-10-CM

## 2020-11-05 DIAGNOSIS — I214 Non-ST elevation (NSTEMI) myocardial infarction: Secondary | ICD-10-CM

## 2020-11-05 DIAGNOSIS — I10 Essential (primary) hypertension: Secondary | ICD-10-CM

## 2020-11-05 LAB — POCT GLYCOSYLATED HEMOGLOBIN (HGB A1C): HbA1c, POC (controlled diabetic range): 6.4 % (ref 0.0–7.0)

## 2020-11-05 LAB — GLUCOSE, POCT (MANUAL RESULT ENTRY): POC Glucose: 171 mg/dl — AB (ref 70–99)

## 2020-11-05 NOTE — Patient Instructions (Signed)
Calorie Counting for Weight Loss Calories are units of energy. Your body needs a certain number of calories from food to keep going throughout the day. When you eat or drink more calories than your body needs, your body stores the extra calories mostly as fat. When you eat or drink fewer calories than your body needs, your body burns fat to get the energy it needs. Calorie counting means keeping track of how many calories you eat and drink each day. Calorie counting can be helpful if you need to lose weight. If you eat fewer calories than your body needs, you should lose weight. Ask your health care provider what a healthy weight is for you. For calorie counting to work, you will need to eat the right number of calories each day to lose a healthy amount of weight per week. A dietitian can help you figure out how many calories you need in a day and will suggest ways to reach your calorie goal.  A healthy amount of weight to lose each week is usually 1-2 lb (0.5-0.9 kg). This usually means that your daily calorie intake should be reduced by 500-750 calories.  Eating 1,200-1,500 calories a day can help most women lose weight.  Eating 1,500-1,800 calories a day can help most men lose weight. What do I need to know about calorie counting? Work with your health care provider or dietitian to determine how many calories you should get each day. To meet your daily calorie goal, you will need to:  Find out how many calories are in each food that you would like to eat. Try to do this before you eat.  Decide how much of the food you plan to eat.  Keep a food log. Do this by writing down what you ate and how many calories it had. To successfully lose weight, it is important to balance calorie counting with a healthy lifestyle that includes regular activity. Where do I find calorie information? The number of calories in a food can be found on a Nutrition Facts label. If a food does not have a Nutrition Facts  label, try to look up the calories online or ask your dietitian for help. Remember that calories are listed per serving. If you choose to have more than one serving of a food, you will have to multiply the calories per serving by the number of servings you plan to eat. For example, the label on a package of bread might say that a serving size is 1 slice and that there are 90 calories in a serving. If you eat 1 slice, you will have eaten 90 calories. If you eat 2 slices, you will have eaten 180 calories.   How do I keep a food log? After each time that you eat, record the following in your food log as soon as possible:  What you ate. Be sure to include toppings, sauces, and other extras on the food.  How much you ate. This can be measured in cups, ounces, or number of items.  How many calories were in each food and drink.  The total number of calories in the food you ate. Keep your food log near you, such as in a pocket-sized notebook or on an app or website on your mobile phone. Some programs will calculate calories for you and show you how many calories you have left to meet your daily goal. What are some portion-control tips?  Know how many calories are in a serving. This will   help you know how many servings you can have of a certain food.  Use a measuring cup to measure serving sizes. You could also try weighing out portions on a kitchen scale. With time, you will be able to estimate serving sizes for some foods.  Take time to put servings of different foods on your favorite plates or in your favorite bowls and cups so you know what a serving looks like.  Try not to eat straight from a food's packaging, such as from a bag or box. Eating straight from the package makes it hard to see how much you are eating and can lead to overeating. Put the amount you would like to eat in a cup or on a plate to make sure you are eating the right portion.  Use smaller plates, glasses, and bowls for smaller  portions and to prevent overeating.  Try not to multitask. For example, avoid watching TV or using your computer while eating. If it is time to eat, sit down at a table and enjoy your food. This will help you recognize when you are full. It will also help you be more mindful of what and how much you are eating. What are tips for following this plan? Reading food labels  Check the calorie count compared with the serving size. The serving size may be smaller than what you are used to eating.  Check the source of the calories. Try to choose foods that are high in protein, fiber, and vitamins, and low in saturated fat, trans fat, and sodium. Shopping  Read nutrition labels while you shop. This will help you make healthy decisions about which foods to buy.  Pay attention to nutrition labels for low-fat or fat-free foods. These foods sometimes have the same number of calories or more calories than the full-fat versions. They also often have added sugar, starch, or salt to make up for flavor that was removed with the fat.  Make a grocery list of lower-calorie foods and stick to it. Cooking  Try to cook your favorite foods in a healthier way. For example, try baking instead of frying.  Use low-fat dairy products. Meal planning  Use more fruits and vegetables. One-half of your plate should be fruits and vegetables.  Include lean proteins, such as chicken, turkey, and fish. Lifestyle Each week, aim to do one of the following:  150 minutes of moderate exercise, such as walking.  75 minutes of vigorous exercise, such as running. General information  Know how many calories are in the foods you eat most often. This will help you calculate calorie counts faster.  Find a way of tracking calories that works for you. Get creative. Try different apps or programs if writing down calories does not work for you. What foods should I eat?  Eat nutritious foods. It is better to have a nutritious,  high-calorie food, such as an avocado, than a food with few nutrients, such as a bag of potato chips.  Use your calories on foods and drinks that will fill you up and will not leave you hungry soon after eating. ? Examples of foods that fill you up are nuts and nut butters, vegetables, lean proteins, and high-fiber foods such as whole grains. High-fiber foods are foods with more than 5 g of fiber per serving.  Pay attention to calories in drinks. Low-calorie drinks include water and unsweetened drinks. The items listed above may not be a complete list of foods and beverages you can eat.   Contact a dietitian for more information.   What foods should I limit? Limit foods or drinks that are not good sources of vitamins, minerals, or protein or that are high in unhealthy fats. These include:  Candy.  Other sweets.  Sodas, specialty coffee drinks, alcohol, and juice. The items listed above may not be a complete list of foods and beverages you should avoid. Contact a dietitian for more information. How do I count calories when eating out?  Pay attention to portions. Often, portions are much larger when eating out. Try these tips to keep portions smaller: ? Consider sharing a meal instead of getting your own. ? If you get your own meal, eat only half of it. Before you start eating, ask for a container and put half of your meal into it. ? When available, consider ordering smaller portions from the menu instead of full portions.  Pay attention to your food and drink choices. Knowing the way food is cooked and what is included with the meal can help you eat fewer calories. ? If calories are listed on the menu, choose the lower-calorie options. ? Choose dishes that include vegetables, fruits, whole grains, low-fat dairy products, and lean proteins. ? Choose items that are boiled, broiled, grilled, or steamed. Avoid items that are buttered, battered, fried, or served with cream sauce. Items labeled as  crispy are usually fried, unless stated otherwise. ? Choose water, low-fat milk, unsweetened iced tea, or other drinks without added sugar. If you want an alcoholic beverage, choose a lower-calorie option, such as a glass of wine or light beer. ? Ask for dressings, sauces, and syrups on the side. These are usually high in calories, so you should limit the amount you eat. ? If you want a salad, choose a garden salad and ask for grilled meats. Avoid extra toppings such as bacon, cheese, or fried items. Ask for the dressing on the side, or ask for olive oil and vinegar or lemon to use as dressing.  Estimate how many servings of a food you are given. Knowing serving sizes will help you be aware of how much food you are eating at restaurants. Where to find more information  Centers for Disease Control and Prevention: www.cdc.gov  [place] Department of Agriculture: myplate.gov Summary  Calorie counting means keeping track of how many calories you eat and drink each day. If you eat fewer calories than your body needs, you should lose weight.  A healthy amount of weight to lose per week is usually 1-2 lb (0.5-0.9 kg). This usually means reducing your daily calorie intake by 500-750 calories.  The number of calories in a food can be found on a Nutrition Facts label. If a food does not have a Nutrition Facts label, try to look up the calories online or ask your dietitian for help.  Use smaller plates, glasses, and bowls for smaller portions and to prevent overeating.  Use your calories on foods and drinks that will fill you up and not leave you hungry shortly after a meal. This information is not intended to replace advice given to you by your health care provider. Make sure you discuss any questions you have with your health care provider. Document Revised: 07/13/2019 Document Reviewed: 07/13/2019 Elsevier Patient Education  2021 [employer]  

## 2020-11-05 NOTE — Progress Notes (Signed)
Subjective:  Patient ID: Nicole Barry, female    DOB: 12/17/1955  Age: 65 y.o. MRN: [id number]  CC: Diabetes   HPI Nicole Barry is a 65 year old femalewith a history ofhypertension, type 2 diabetes mellitus(6.4), hyperlipidemia, NSTEMIstatus post DES to LAD, anxiety and depression who presents today for chronic disease management.  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  She drives about 45 minutes 1 way to go see her mother in [place] and has been eating on the go and not too compliant with diabetic diet and not getting any exercise.  She has gained 9 pounds in the last 6 weeks. Denies presence of blurry vision, numbness in extremities or hypoglycemia. Compliant with her antihypertensive and her statin. From a cardiac standpoint she is doing well with no chest pain, dyspnea or pedal edema and her anxiety and depression are controlled. She is due for her second dose of shingles vaccine.  Past Medical History:  Diagnosis Date  . Acute diverticulitis 05/17/2014  . Anxiety   . CAD (coronary artery disease)   . Depression   . Diverticulitis 05/17/2014  . HTN (hypertension)   . Hyperlipidemia LDL goal <70   . Non-ST elevation (NSTEMI) myocardial infarction (HCC) 12/05/2019  . Normocytic anemia 05/19/2014  . NSTEMI (non-ST elevated myocardial infarction) (HCC)    12/05/19: DES-LAD  . Sepsis (HCC) 05/19/2014  . Type 2 diabetes mellitus with hyperosmolarity without coma, without long-term current use of insulin (HCC)   . Unstable angina (HCC) 12/05/2019    Past Surgical History:  Procedure Laterality Date  . BREAST SURGERY    . CESAREAN SECTION    . CORONARY STENT INTERVENTION N/A 12/05/2019   Procedure: CORONARY STENT INTERVENTION;  Surgeon: Kathleene Hazel, MD;  Location: [facility];  Service: Cardiovascular;  Laterality: N/A;  . LEFT HEART CATH AND CORONARY ANGIOGRAPHY N/A 12/05/2019   Procedure: LEFT  HEART CATH AND CORONARY ANGIOGRAPHY;  Surgeon: Kathleene Hazel, MD;  Location: [facility];  Service: Cardiovascular;  Laterality: N/A;  . rotator cull surg     lt shoulder    Family History  Problem Relation Age of Onset  . Heart attack Father   . Hypertension Sister   . Heart attack Brother   . Diabetes Sister   . Colon cancer Neg Hx     Allergies  Allergen Reactions  . Sulfa Antibiotics Rash    Outpatient Medications Prior to Visit  Medication Sig Dispense Refill  . albuterol (VENTOLIN HFA) 108 (90 Base) MCG/ACT inhaler INHALE 2 PUFFS INTO THE LUNGS EVERY 6 (SIX) HOURS AS NEEDED FOR WHEEZING OR SHORTNESS OF BREATH. 8.5 g 2  . aspirin EC 81 MG EC tablet Take 1 tablet (81 mg total) by mouth daily. Swallow whole. 30 tablet 11  . clopidogrel (PLAVIX) 75 MG tablet TAKE 1 TABLET (75 MG TOTAL) BY MOUTH DAILY. 90 tablet 3  . Dulaglutide 1.5 MG/0.5ML SOPN INJECT 1.5 MG INTO THE SKIN ONCE A WEEK. 2 mL 3  . empagliflozin (JARDIANCE) 10 MG TABS tablet Take 1 tablet (10 mg total) by mouth daily before breakfast. 30 tablet 6  . escitalopram (LEXAPRO) 20 MG tablet TAKE 1 TABLET (20 MG TOTAL) BY MOUTH DAILY. 30 tablet 6  . ezetimibe (ZETIA) 10 MG tablet TAKE 1 TABLET (10 MG TOTAL) BY MOUTH DAILY. 90 tablet 3  . fluticasone (FLONASE) 50 MCG/ACT nasal spray Place 1 spray into both  nostrils daily.    . furosemide (LASIX) 20 MG tablet TAKE 1 TABLET (20 MG TOTAL) BY MOUTH DAILY. 90 tablet 3  . hydrOXYzine (ATARAX/VISTARIL) 25 MG tablet TAKE 1 TABLET (25 MG TOTAL) BY MOUTH 3 (THREE) TIMES DAILY AS NEEDED. 60 tablet 1  . loratadine (CLARITIN) 10 MG tablet Take 10 mg by mouth daily.    Marland Kitchen losartan (COZAAR) 50 MG tablet TAKE 1 TABLET (50 MG TOTAL) BY MOUTH DAILY. 30 tablet 6  . meclizine (ANTIVERT) 25 MG tablet Take 1 tablet (25 mg total) by mouth 3 (three) times daily as needed for dizziness. 30 tablet 0  . metoprolol succinate (TOPROL-XL) 25 MG 24 hr tablet TAKE 1 TABLET (25 MG TOTAL) BY  MOUTH DAILY. 90 tablet 3  . nitroGLYCERIN (NITROSTAT) 0.4 MG SL tablet Place 1 tablet (0.4 mg total) under the tongue every 5 (five) minutes as needed for chest pain. 25 tablet 3  . pantoprazole (PROTONIX) 40 MG tablet TAKE 1 TABLET (40 MG TOTAL) BY MOUTH DAILY. 90 tablet 3  . rosuvastatin (CRESTOR) 40 MG tablet TAKE 1 TABLET (40 MG TOTAL) BY MOUTH DAILY. 90 tablet 3  . albuterol (VENTOLIN HFA) 108 (90 Base) MCG/ACT inhaler INHALE 2 PUFFS INTO THE LUNGS EVERY 6 (SIX) HOURS AS NEEDED FOR WHEEZING OR SHORTNESS OF BREATH. 18 g 1  . furosemide (LASIX) 20 MG tablet Take 1 tablet (20 mg total) by mouth daily. 90 tablet 3  . pantoprazole (PROTONIX) 40 MG tablet Take 1 tablet (40 mg total) by mouth daily. 30 tablet 6   No facility-administered medications prior to visit.     ROS Review of Systems  Constitutional: Positive for unexpected weight change. Negative for activity change, appetite change and fatigue.  HENT: Negative for congestion, sinus pressure and sore throat.   Eyes: Negative for visual disturbance.  Respiratory: Negative for cough, chest tightness, shortness of breath and wheezing.   Cardiovascular: Negative for chest pain and palpitations.  Gastrointestinal: Negative for abdominal distention, abdominal pain and constipation.  Endocrine: Negative for polydipsia.  Genitourinary: Negative for dysuria and frequency.  Musculoskeletal: Negative for arthralgias and back pain.  Skin: Negative for rash.  Neurological: Negative for tremors, light-headedness and numbness.  Hematological: Does not bruise/bleed easily.  Psychiatric/Behavioral: Negative for agitation and behavioral problems.    Objective:  BP 131/74   Pulse 68   Ht 4\' 10"  (1.473 m)   Wt 199 lb 3.2 oz (90.4 kg)   SpO2 100%   BMI 41.63 kg/m   BP/Weight 11/05/2020 09/19/2020 09/19/2020  Systolic BP 131 129 150  Diastolic BP 74 59 86  Wt. (Lbs) 199.2 190 -  BMI 41.63 39.71 -      Physical Exam Constitutional:       Appearance: She is well-developed. She is obese.  Neck:     Vascular: No JVD.  Cardiovascular:     Rate and Rhythm: Normal rate.     Heart sounds: Normal heart sounds. No murmur heard.   Pulmonary:     Effort: Pulmonary effort is normal.     Breath sounds: Normal breath sounds. No wheezing or rales.  Chest:     Chest wall: No tenderness.  Abdominal:     General: Bowel sounds are normal. There is no distension.     Palpations: Abdomen is soft. There is no mass.     Tenderness: There is no abdominal tenderness.  Musculoskeletal:        General: Normal range of motion.  Right lower leg: No edema.     Left lower leg: No edema.  Neurological:     Mental Status: She is alert and oriented to person, place, and time.  Psychiatric:        Mood and Affect: Mood normal.     CMP Latest Ref Rng & Units 09/19/2020 07/10/2020 04/10/2020  Glucose 70 - 99 mg/dL 99 295(M100(H) 841(L126(H)  BUN 8 - 23 mg/dL 15 12 17   Creatinine 0.44 - 1.00 mg/dL 2.440.82 0.100.78 2.720.97  Sodium 135 - 145 mmol/L 141 142 145(H)  Potassium 3.5 - 5.1 mmol/L 4.0 4.2 4.3  Chloride 98 - 111 mmol/L 102 99 105  CO2 22 - 32 mmol/L 31 28 25   Calcium 8.9 - 10.3 mg/dL 9.5 9.5 9.6  Total Protein 6.5 - 8.1 g/dL 6.9 7.3 7.0  Total Bilirubin 0.3 - 1.2 mg/dL 0.4 0.4 0.3  Alkaline Phos 38 - 126 U/L 52 76 87  AST 15 - 41 U/L 17 19 20   ALT 0 - 44 U/L 20 21 29     Lipid Panel     Component Value Date/Time   CHOL 156 03/20/2020 1114   TRIG 153 (H) 03/20/2020 1114   HDL 40 03/20/2020 1114   CHOLHDL 3.9 03/20/2020 1114   CHOLHDL 6.2 12/06/2019 0310   VLDL 64 (H) 12/06/2019 0310   LDLCALC 89 03/20/2020 1114    CBC    Component Value Date/Time   WBC 4.9 09/19/2020 1852   RBC 4.98 09/19/2020 1852   HGB 15.0 09/19/2020 1852   HGB 13.7 01/08/2020 1030   HCT 44.8 09/19/2020 1852   HCT 41.3 01/08/2020 1030   PLT 282 09/19/2020 1852   PLT 394 01/08/2020 1030   MCV 90.0 09/19/2020 1852   MCV 92 01/08/2020 1030   MCH 30.1 09/19/2020 1852    MCHC 33.5 09/19/2020 1852   RDW 12.3 09/19/2020 1852   RDW 12.5 01/08/2020 1030   LYMPHSABS 1.8 09/19/2020 1852   LYMPHSABS 1.5 01/08/2020 1030   MONOABS 0.5 09/19/2020 1852   EOSABS 0.2 09/19/2020 1852   EOSABS 0.3 01/08/2020 1030   BASOSABS 0.0 09/19/2020 1852   BASOSABS 0.0 01/08/2020 1030   Lab Results  Component Value Date   HGBA1C 6.4 11/05/2020     Assessment & Plan:  1. Type 2 diabetes mellitus with other circulatory complication, without long-term current use of insulin (HCC) Controlled with A1c of 6.4; goal is less than 7.0 Continue current regimen We have discussed increasing her Trulicity dose due to weight loss benefit and she would like to revisit this again at her next visit given she just picked up a 90-day supply of Jardiance. Counseled on Diabetic diet, my plate method, [id number] minutes of moderate intensity exercise/week Blood sugar logs with fasting goals of 80-120 mg/dl, random of less than [id number] and in the event of sugars less than 60 mg/dl or greater than 034400 mg/dl encouraged to notify the clinic. Advised on the need for annual eye exams, annual foot exams, Pneumonia vaccine. - POCT glucose (manual entry) - POCT glycosylated hemoglobin (Hb A1C)  2. Morbid obesity (HCC) Counseled on beginning a daily exercise regimen starting at 10 minutes/day Reduce portion sizes, avoid eating fast foods and avoid late meals We have discussed the role of GLP-1 in weight loss and possibly increasing her Trulicity dose.  At her next visit we will revisit this as she just picked up a 90-day supply of Jardiance for management of her diabetes  3. NSTEMI (non-ST elevated myocardial  infarction) (HCC) No angina Continue with high intensity statin, beta-blocker Risk factor modification  4. Anxiety and depression Controlled  5. Essential hypertension Controlled Counseled on blood pressure goal of less than 130/80, low-sodium, DASH diet, medication compliance, 150 minutes of  moderate intensity exercise per week. Discussed medication compliance, adverse effects.  6. Need for zoster vaccination - Varicella-zoster vaccine IM    No orders of the defined types were placed in this encounter.   Return in about 3 months (around 02/05/2021) for Chronic medical conditions.       Hoy Register, MD, FAAFP. [facility] [place], [place] [phone]   11/05/2020, 1:50 PM

## 2020-11-19 MED FILL — Clopidogrel Bisulfate Tab 75 MG (Base Equiv): ORAL | 30 days supply | Qty: 30 | Fill #1 | Status: AC

## 2020-11-19 MED FILL — Escitalopram Oxalate Tab 20 MG (Base Equiv): ORAL | 30 days supply | Qty: 30 | Fill #0 | Status: AC

## 2020-11-19 MED FILL — Metoprolol Succinate Tab ER 24HR 25 MG (Tartrate Equiv): ORAL | 30 days supply | Qty: 30 | Fill #1 | Status: AC

## 2020-11-20 ENCOUNTER — Other Ambulatory Visit: Payer: Self-pay

## 2020-11-21 ENCOUNTER — Other Ambulatory Visit: Payer: Self-pay

## 2020-11-27 ENCOUNTER — Other Ambulatory Visit: Payer: Self-pay

## 2020-11-27 MED FILL — Dulaglutide Soln Auto-injector 1.5 MG/0.5ML: SUBCUTANEOUS | 28 days supply | Qty: 2 | Fill #2 | Status: CN

## 2020-11-27 MED FILL — Dulaglutide Soln Auto-injector 1.5 MG/0.5ML: SUBCUTANEOUS | 28 days supply | Qty: 2 | Fill #2 | Status: AC

## 2020-12-02 ENCOUNTER — Other Ambulatory Visit: Payer: Self-pay

## 2020-12-10 ENCOUNTER — Other Ambulatory Visit: Payer: Self-pay

## 2020-12-10 MED FILL — Losartan Potassium Tab 50 MG: ORAL | 30 days supply | Qty: 30 | Fill #2 | Status: AC

## 2020-12-10 MED FILL — Ezetimibe Tab 10 MG: ORAL | 30 days supply | Qty: 30 | Fill #2 | Status: AC

## 2020-12-10 MED FILL — Rosuvastatin Calcium Tab 40 MG: ORAL | 30 days supply | Qty: 30 | Fill #2 | Status: AC

## 2020-12-13 ENCOUNTER — Other Ambulatory Visit: Payer: Self-pay

## 2020-12-13 ENCOUNTER — Other Ambulatory Visit: Payer: Self-pay | Admitting: Family Medicine

## 2020-12-13 MED ORDER — FUROSEMIDE 20 MG PO TABS
20.0000 mg | ORAL_TABLET | Freq: Every day | ORAL | 2 refills | Status: DC
  Filled 2020-12-13: qty 30, 30d supply, fill #0
  Filled 2021-01-15: qty 30, 30d supply, fill #1
  Filled 2021-02-20: qty 30, 30d supply, fill #2

## 2020-12-13 MED ORDER — PANTOPRAZOLE SODIUM 40 MG PO TBEC
40.0000 mg | DELAYED_RELEASE_TABLET | Freq: Every day | ORAL | 2 refills | Status: DC
  Filled 2020-12-13: qty 30, 30d supply, fill #0
  Filled 2021-01-15: qty 30, 30d supply, fill #1
  Filled 2021-02-20: qty 30, 30d supply, fill #2

## 2020-12-13 NOTE — Telephone Encounter (Signed)
Notes to clinic:  medication filled by different provider  Review for continue use and refill    Requested Prescriptions  Pending Prescriptions Disp Refills   furosemide (LASIX) 20 MG tablet 90 tablet 3    Sig: Take by mouth daily.      Cardiovascular:  Diuretics - Loop Passed - 12/13/2020 10:43 AM      Passed - K in normal range and within 360 days    Potassium  Date Value Ref Range Status  09/19/2020 4.0 3.5 - 5.1 mmol/L Final          Passed - Ca in normal range and within 360 days    Calcium  Date Value Ref Range Status  09/19/2020 9.5 8.9 - 10.3 mg/dL Final          Passed - Na in normal range and within 360 days    Sodium  Date Value Ref Range Status  09/19/2020 141 135 - 145 mmol/L Final  07/10/2020 142 134 - 144 mmol/L Final          Passed - Cr in normal range and within 360 days    Creatinine, Ser  Date Value Ref Range Status  09/19/2020 0.82 0.44 - 1.00 mg/dL Final          Passed - Last BP in normal range    BP Readings from Last 1 Encounters:  11/05/20 131/74          Passed - Valid encounter within last 6 months    Recent Outpatient Visits           1 month ago Type 2 diabetes mellitus with other circulatory complication, without long-term current use of insulin (HCC)   [facility] [place], Odette Horns, MD   4 months ago Annual physical exam   [facility] [place], [place], MD   5 months ago Type 2 diabetes mellitus with other circulatory complication, without long-term current use of insulin (HCC)   [facility] [place], [place], MD   7 months ago Type 2 diabetes mellitus with other circulatory complication, without long-term current use of insulin [facility])   [facility] [place], Cornelius Moras, RPH-CPP   8 months ago Encounter for medication review   [facility] Drucilla Chalet, RPH-CPP        Future Appointments             In 5 days Kroeger, Ovidio Kin., PA-C [facility] [place], [facility]   In 1 month [place], [place], MD [facility]               pantoprazole (PROTONIX) 40 MG tablet 90 tablet 3    Sig: Take by mouth daily.      Gastroenterology: Proton Pump Inhibitors Passed - 12/13/2020 10:43 AM      Passed - Valid encounter within last 12 months    Recent Outpatient Visits           1 month ago Type 2 diabetes mellitus with other circulatory complication, without long-term current use of insulin (HCC)   [facility] [place], Odette Horns, MD   4 months ago Annual physical exam   [facility] [place], Odette Horns, MD   5 months ago Type 2 diabetes mellitus with other circulatory complication, without long-term current use of insulin [facility])    [facility] Hoy Register, MD   7 months ago Type 2 diabetes mellitus with other circulatory complication, without long-term current use of insulin [facility])   [facility] [place], Cornelius Moras, RPH-CPP   8 months ago Encounter for medication review   [facility] Drucilla Chalet, RPH-CPP       Future Appointments             In 5 days Kroeger, Ovidio Kin., PA-C [facility] [place], [facility]   In 1 month Hoy Register, MD [facility]

## 2020-12-17 NOTE — Progress Notes (Addendum)
Cardiology Office Note   Date:  12/18/2020   ID:  Nicole Barry, DOB 1956-01-23, MRN [id number]  PCP:  Hoy Register, MD  Cardiologist:  Chrystie Nose, MD EP: None  Chief Complaint  Patient presents with   Pre-op Exam   Coronary Artery Disease       History of Present Illness: Nicole Barry is a 65 y.o. 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   She was last evaluated by cardiology at an outpatient visit with Dr. Rennis Golden 03/2020 at which time she was doing well from a cardiac stand point without any anginal complaints. Her LDL was above goal at 89 on most recent lipids and she was recommended to add zetia 10mg  daily with plans to repeat FLP in 3 months and follow-up in office in 6 months. Since her last visit she was evaluated in the ED 09/2020 for vertigo, however also reported a recent isolated episode of chest tightness lasting 1 hour and resolved with 1 SL nitro in the setting of significant emotion stress after finding out her mother has stage IV lung cancer.   Our office was contacted 10/2020 for preop assessment for upcoming colonoscopy 02/2021, prompting this visit.  Her last echocardiogram 03/2020 showed EF 55-60% (improved from 30-35% 11/2019), G2DD, no RWMA, no significant valvular abnormalities, and mild dilation of the ascending aorta (32mm). Her last ischemic evaluation was a LHC in the setting of an NSTEMI 11/2019 which showed 99% pLAD stenosis managed with PCI/DES and medically managed 65% D1 stenosis and 30% mLAD stenosis.  She presents today for preop assessment. She has been doing okay from a cardiac standpoint since her last visit. She has been very busy/stressed at work (she coordinates production of dog shows), in addition to home stress with the progression of her mothers stage IV lung cancer. As a result, her health has taken a back seat and  she is not exercising or eating as she had previously. She frequently picks up meals on the go and has noticed some weight gain. She reports infrequent episodes of upper back squeezing pain which she states is her anginal equivalent. She has taken ~5 SL nitro since her MI 11/2019. Upper back pain is relieved with 1 SL nitro and she denies episodes outside of these that she has not needed SL nitro for. She has had DOE which she attributes to her weight gain and deconditioning. She has chronic knee pain which limits her activity some. She denies recent dizziness, lightheadedness, syncope, palpitations, orthopnea, PND, or LE edema.    Past Medical History:  Diagnosis Date   Acute diverticulitis 05/17/2014   Anxiety    CAD (coronary artery disease)    Depression    Diverticulitis 05/17/2014   HTN (hypertension)    Hyperlipidemia LDL goal <70    Non-ST elevation (NSTEMI) myocardial infarction (HCC) 12/05/2019   Normocytic anemia 05/19/2014   NSTEMI (non-ST elevated myocardial infarction) (HCC)    12/05/19: DES-LAD   Sepsis (HCC) 05/19/2014   Type 2 diabetes mellitus with hyperosmolarity without coma, without long-term current use of insulin (HCC)    Unstable angina (HCC) 12/05/2019    Past Surgical History:  Procedure Laterality Date   BREAST SURGERY     CESAREAN SECTION     CORONARY STENT INTERVENTION N/A 12/05/2019   Procedure: CORONARY STENT INTERVENTION;  Surgeon: 12/07/2019  D, MD;  Location: [facility];  Service: Cardiovascular;  Laterality: N/A;   LEFT HEART CATH AND CORONARY ANGIOGRAPHY N/A 12/05/2019   Procedure: LEFT HEART CATH AND CORONARY ANGIOGRAPHY;  Surgeon: Kathleene HazelMcAlhany, Christopher D, MD;  Location: [facility];  Service: Cardiovascular;  Laterality: N/A;   rotator cull surg     lt shoulder     Current Outpatient Medications  Medication Sig Dispense Refill   albuterol (VENTOLIN HFA) 108 (90 Base) MCG/ACT inhaler INHALE 2 PUFFS INTO THE LUNGS EVERY 6 (SIX)  HOURS AS NEEDED FOR WHEEZING OR SHORTNESS OF BREATH. 8.5 g 2   aspirin EC 81 MG EC tablet Take 1 tablet (81 mg total) by mouth daily. Swallow whole. 30 tablet 11   clopidogrel (PLAVIX) 75 MG tablet TAKE 1 TABLET (75 MG TOTAL) BY MOUTH DAILY. 90 tablet 3   Dulaglutide 1.5 MG/0.5ML SOPN INJECT 1.5 MG INTO THE SKIN ONCE A WEEK. 2 mL 3   empagliflozin (JARDIANCE) 10 MG TABS tablet Take 1 tablet (10 mg total) by mouth daily before breakfast. 30 tablet 6   escitalopram (LEXAPRO) 20 MG tablet TAKE 1 TABLET (20 MG TOTAL) BY MOUTH DAILY. 30 tablet 6   ezetimibe (ZETIA) 10 MG tablet TAKE 1 TABLET (10 MG TOTAL) BY MOUTH DAILY. 90 tablet 3   fluticasone (FLONASE) 50 MCG/ACT nasal spray Place 1 spray into both nostrils daily.     furosemide (LASIX) 20 MG tablet Take 1 tablet (20 mg total) by mouth daily. 30 tablet 2   hydrOXYzine (ATARAX/VISTARIL) 25 MG tablet TAKE 1 TABLET (25 MG TOTAL) BY MOUTH 3 (THREE) TIMES DAILY AS NEEDED. 60 tablet 1   loratadine (CLARITIN) 10 MG tablet Take 10 mg by mouth daily.     losartan (COZAAR) 50 MG tablet TAKE 1 TABLET (50 MG TOTAL) BY MOUTH DAILY. 30 tablet 6   metoprolol succinate (TOPROL-XL) 25 MG 24 hr tablet TAKE 1 TABLET (25 MG TOTAL) BY MOUTH DAILY. 90 tablet 3   pantoprazole (PROTONIX) 40 MG tablet Take 1 tablet (40 mg total) by mouth daily. 30 tablet 2   rosuvastatin (CRESTOR) 40 MG tablet TAKE 1 TABLET (40 MG TOTAL) BY MOUTH DAILY. 90 tablet 3   nitroGLYCERIN (NITROSTAT) 0.4 MG SL tablet Place 1 tablet (0.4 mg total) under the tongue every 5 (five) minutes as needed for chest pain. 25 tablet 3   No current facility-administered medications for this visit.    Allergies:   Sulfa antibiotics    Social History:  The patient  reports that she has never smoked. She has never used smokeless tobacco. She reports current alcohol use. She reports that she does not use drugs.   Family History:  The patient's family history includes Diabetes in her sister; Heart attack in  her brother and father; Hypertension in her sister.    ROS:  Please see the history of present illness.   Otherwise, review of systems are positive for none.   All other systems are reviewed and negative.    PHYSICAL EXAM: VS:  BP 126/74 (BP Location: Left Arm, Patient Position: Sitting, Cuff Size: Normal)   Pulse 70   Resp 18   Ht 4\' 10"  (1.473 m)   Wt 201 lb 6.4 oz (91.4 kg)   SpO2 97%   BMI 42.09 kg/m  , BMI Body mass index is 42.09 kg/m. GEN: Well nourished, well developed, in no acute distress HEENT: sclera anicteric Neck: no JVD, carotid bruits, or masses Cardiac: RRR; no murmurs,  rubs, or gallops,no edema  Respiratory:  clear to auscultation bilaterally, normal work of breathing GI: soft, obese, nontender, nondistended, + BS MS: no deformity or atrophy Skin: warm and dry, no rash Neuro:  Strength and sensation are intact Psych: euthymic mood, full affect   EKG:  EKG is ordered today. The ekg ordered today demonstrates sinus rhythm, rate 70 bpm, no STE/D, no TWI   Recent Labs: 09/19/2020: ALT 20; BUN 15; Creatinine, Ser 0.82; Hemoglobin 15.0; Platelets 282; Potassium 4.0; Sodium 141    Lipid Panel    Component Value Date/Time   CHOL 156 03/20/2020 1114   TRIG 153 (H) 03/20/2020 1114   HDL 40 03/20/2020 1114   CHOLHDL 3.9 03/20/2020 1114   CHOLHDL 6.2 12/06/2019 0310   VLDL 64 (H) 12/06/2019 0310   LDLCALC 89 03/20/2020 1114      Wt Readings from Last 3 Encounters:  12/18/20 201 lb 6.4 oz (91.4 kg)  11/05/20 199 lb 3.2 oz (90.4 kg)  09/19/20 190 lb (86.2 kg)      Other studies Reviewed: Additional studies/ records that were reviewed today include:   Echocardiogram 03/2020: 1. Left ventricular ejection fraction, by estimation, is 55 to 60%. Left  ventricular ejection fraction by PLAX is 58 %. The left ventricle has  normal function. The left ventricle has no regional wall motion  abnormalities. Left ventricular diastolic  parameters are consistent  with Grade II diastolic dysfunction  (pseudonormalization). Elevated left ventricular end-diastolic pressure.   2. Right ventricular systolic function is normal. The right ventricular  size is normal. There is normal pulmonary artery systolic pressure.   3. The mitral valve is normal in structure. Trivial mitral valve  regurgitation. No evidence of mitral stenosis.   4. The aortic valve is tricuspid. Aortic valve regurgitation is not  visualized. No aortic stenosis is present.   5. Aortic dilatation noted. There is mild dilatation of the ascending  aorta, measuring 41 mm.   6. The inferior vena cava is normal in size with greater than 50%  respiratory variability, suggesting right atrial pressure of 3 mmHg.   LHC 11/2019: Prox LAD lesion is 99% stenosed. 1st Diag lesion is 65% stenosed. Mid LAD lesion is 30% stenosed. A drug-eluting stent was successfully placed using a SYNERGY XD [id number]. Post intervention, there is a 0% residual stenosis.   1. Severe proximal LAD stenosis 2. Successful PTCA/DES x 1 proximal LAD 3. Moderate stenosis in the ostium of a moderate caliber diagonal branch. 4. Elevated LVEDP   Recommendations: IV Lasix x 1. Echo in am. Continue ASA and Brilinta for one year. Continue statin and beta blocker. Medical management of Diagonal stenosis.    ASSESSMENT AND PLAN:  1. Preop assessment: patient is anticipating routine colonoscopy 02/2021. She has rare episodes of upper back pain (her anginal equivalent) which is always relieved with 1 SL nitro, last used 3 weeks ago. She has had some DOE in recent months which she attributes to weight gain and deconditioning.  - Will update a NST to r/o ischemia given new DOE and intermittent, though rare, anginal equivalent symptoms - If NST is negative, would be reasonable to discontinue her plavix at this time as she is >1 year out from her PCI  2. CAD s/p PCI/DES to LAD 11/2019: She is now >44yr out from her NSTEMI. Has some rare  episode of upper back pain as mention above.  - Continue aspirin and plavix for now - Continue statin and zetia - Continue Bblocker  3. Chronic combined CHF/ICM: patient with subsequent recovery in EF from 30-35% 11/2019 to 55-60% 03/2020. No recent volume overload complaints though has had some recent DOE.  - Continue metoprolol succinate, losartan, and jardiance - Continue lasix  4. HTN: BP 126/746.4 today - Continue metoprolol succinate, losartan, and lasix  5. HLD: No repeat lipids since FLP 03/2020 with LDL 89, triglycerides 153. Zetia started 03/2020. She was subsequently transition from atorvastatin 80mg  daily to crestor 40mg  daily by her PCP.  - Will repeat FLP/LFTs - low threshold to refer to lipid clinic if LDL remains above goal of - Continue crestor and zetia  6. DM type 2: A1C 6.4 10/2020. She notes increased yeast infections since starting jardiance. We discussed considering ozempic as an alternative option given improvement in her CHF. She will discuss with her PCP at her next follow-up visit.  - Continue jardiance - Continue dulaglutide per PCP   Current medicines are reviewed at length with the patient today.  The patient does not have concerns regarding medicines.  The following changes have been made:  As above  Labs/ tests ordered today include:   Orders Placed This Encounter  Procedures   Comprehensive metabolic panel   Lipid panel   Cardiac Stress Test: Informed Consent Details: Physician/Practitioner Attestation; Transcribe to consent form and obtain patient signature   MYOCARDIAL PERFUSION IMAGING   EKG 12-Lead      Disposition:   FU with Dr. <97 in 6 months  Signed, 11/2020, PA-C  12/18/2020 10:57 AM      ADDENDUM:  Patient completed NST 01/06/21 which was low risk and without ischemia. At this point she is cleared to proceed with her colonoscopy planned in 02/2021.   The patient was advised that if she develops new symptoms prior to  surgery to contact our office to arrange for a follow-up visit, and she verbalized understanding.  I will route this recommendation to the requesting party via Epic fax function and remove from pre-op pool.  Please call with questions.  01/08/21, PA-C 01/08/2021, 12:04 PM

## 2020-12-18 ENCOUNTER — Other Ambulatory Visit: Payer: Self-pay

## 2020-12-18 ENCOUNTER — Encounter: Payer: Self-pay | Admitting: Medical

## 2020-12-18 ENCOUNTER — Ambulatory Visit: Payer: 59 | Admitting: Medical

## 2020-12-18 VITALS — BP 126/74 | HR 70 | Resp 18 | Ht <= 58 in | Wt 201.4 lb

## 2020-12-18 DIAGNOSIS — I5042 Chronic combined systolic (congestive) and diastolic (congestive) heart failure: Secondary | ICD-10-CM | POA: Diagnosis not present

## 2020-12-18 DIAGNOSIS — I255 Ischemic cardiomyopathy: Secondary | ICD-10-CM

## 2020-12-18 DIAGNOSIS — I2510 Atherosclerotic heart disease of native coronary artery without angina pectoris: Secondary | ICD-10-CM

## 2020-12-18 DIAGNOSIS — E785 Hyperlipidemia, unspecified: Secondary | ICD-10-CM

## 2020-12-18 DIAGNOSIS — E119 Type 2 diabetes mellitus without complications: Secondary | ICD-10-CM

## 2020-12-18 DIAGNOSIS — Z01810 Encounter for preprocedural cardiovascular examination: Secondary | ICD-10-CM | POA: Diagnosis not present

## 2020-12-18 DIAGNOSIS — I10 Essential (primary) hypertension: Secondary | ICD-10-CM

## 2020-12-18 DIAGNOSIS — Z9861 Coronary angioplasty status: Secondary | ICD-10-CM

## 2020-12-18 MED ORDER — NITROGLYCERIN 0.4 MG SL SUBL
0.4000 mg | SUBLINGUAL_TABLET | SUBLINGUAL | 3 refills | Status: DC | PRN
  Filled 2020-12-18 – 2021-09-04 (×3): qty 25, 25d supply, fill #0

## 2020-12-18 MED FILL — Clopidogrel Bisulfate Tab 75 MG (Base Equiv): ORAL | 30 days supply | Qty: 30 | Fill #2 | Status: AC

## 2020-12-18 MED FILL — Metoprolol Succinate Tab ER 24HR 25 MG (Tartrate Equiv): ORAL | 30 days supply | Qty: 30 | Fill #2 | Status: AC

## 2020-12-18 NOTE — Patient Instructions (Signed)
Medication Instructions:  No changes *If you need a refill on your cardiac medications before your next appointment, please call your pharmacy*   Lab Work: CMP, Lipid  If you have labs (blood work) drawn today and your tests are completely normal, you will receive your results only by: MyChart Message (if you have MyChart) OR A paper copy in the mail If you have any lab test that is abnormal or we need to change your treatment, we will call you to review the results.   Testing/Procedures: [address] Your physician has requested that you have a Scientist, physiological. For further information please visit [email or web address] Please follow instruction sheet, as given.    Follow-Up: At [facility], you and your health needs are our priority.  As part of our continuing mission to provide you with exceptional heart care, we have created designated Provider Care Teams.  These Care Teams include your primary Cardiologist (physician) and Advanced Practice Providers (APPs -  Physician Assistants and Nurse Practitioners) who all work together to provide you with the care you need, when you need it.    Your next appointment:   6 month(s)  The format for your next appointment:   In Person  Provider:   Kirtland Bouchard [place] Hilty, MD   Other Instructions Consider discussing with PCP Ozempic.

## 2020-12-19 ENCOUNTER — Other Ambulatory Visit: Payer: Self-pay

## 2020-12-20 ENCOUNTER — Other Ambulatory Visit: Payer: Self-pay

## 2020-12-30 ENCOUNTER — Telehealth (HOSPITAL_COMMUNITY): Payer: Self-pay | Admitting: *Deleted

## 2020-12-30 NOTE — Telephone Encounter (Signed)
Left message on voicemail per DPR in reference to upcoming appointment scheduled on 01/03/21 with detailed instructions given per Myocardial Perfusion Study Information Sheet for the test. LM to arrive 15 minutes early, and that it is imperative to arrive on time for appointment to keep from having the test rescheduled. If you need to cancel or reschedule your appointment, please call the office within 24 hours of your appointment. Failure to do so may result in a cancellation of your appointment, and a $50 no show fee. Phone number given for call back for any questions. Ricky Ala

## 2021-01-02 ENCOUNTER — Other Ambulatory Visit: Payer: Self-pay | Admitting: Family Medicine

## 2021-01-02 DIAGNOSIS — E1159 Type 2 diabetes mellitus with other circulatory complications: Secondary | ICD-10-CM

## 2021-01-03 ENCOUNTER — Other Ambulatory Visit: Payer: Self-pay

## 2021-01-03 ENCOUNTER — Ambulatory Visit (HOSPITAL_COMMUNITY): Payer: 59 | Attending: Cardiovascular Disease

## 2021-01-03 DIAGNOSIS — I5042 Chronic combined systolic (congestive) and diastolic (congestive) heart failure: Secondary | ICD-10-CM

## 2021-01-03 DIAGNOSIS — I255 Ischemic cardiomyopathy: Secondary | ICD-10-CM

## 2021-01-03 DIAGNOSIS — Z01810 Encounter for preprocedural cardiovascular examination: Secondary | ICD-10-CM

## 2021-01-03 DIAGNOSIS — E119 Type 2 diabetes mellitus without complications: Secondary | ICD-10-CM | POA: Diagnosis present

## 2021-01-03 DIAGNOSIS — I2510 Atherosclerotic heart disease of native coronary artery without angina pectoris: Secondary | ICD-10-CM | POA: Diagnosis present

## 2021-01-03 DIAGNOSIS — Z9861 Coronary angioplasty status: Secondary | ICD-10-CM

## 2021-01-03 DIAGNOSIS — I10 Essential (primary) hypertension: Secondary | ICD-10-CM

## 2021-01-03 DIAGNOSIS — E785 Hyperlipidemia, unspecified: Secondary | ICD-10-CM

## 2021-01-03 MED ORDER — TECHNETIUM TC 99M TETROFOSMIN IV KIT
31.8000 | PACK | Freq: Once | INTRAVENOUS | Status: AC | PRN
  Administered 2021-01-03: 31.8 via INTRAVENOUS
  Filled 2021-01-03: qty 32

## 2021-01-03 MED ORDER — TRULICITY 1.5 MG/0.5ML ~~LOC~~ SOAJ
1.5000 mg | SUBCUTANEOUS | 0 refills | Status: DC
  Filled 2021-01-03: qty 2, 28d supply, fill #0

## 2021-01-03 MED ORDER — REGADENOSON 0.4 MG/5ML IV SOLN
0.4000 mg | Freq: Once | INTRAVENOUS | Status: AC
  Administered 2021-01-03: 0.4 mg via INTRAVENOUS

## 2021-01-06 ENCOUNTER — Other Ambulatory Visit: Payer: Self-pay

## 2021-01-06 ENCOUNTER — Ambulatory Visit (HOSPITAL_COMMUNITY): Payer: 59 | Attending: Internal Medicine

## 2021-01-06 LAB — MYOCARDIAL PERFUSION IMAGING
LV dias vol: 54 mL (ref 46–106)
LV sys vol: 20 mL
Peak HR: 81 {beats}/min
Rest HR: 57 {beats}/min
SDS: 0
SRS: 0
SSS: 0
TID: 1.02

## 2021-01-06 MED ORDER — TECHNETIUM TC 99M TETROFOSMIN IV KIT
30.8000 | PACK | Freq: Once | INTRAVENOUS | Status: AC | PRN
  Administered 2021-01-06: 30.8 via INTRAVENOUS
  Filled 2021-01-06: qty 31

## 2021-01-15 ENCOUNTER — Other Ambulatory Visit: Payer: Self-pay | Admitting: Family Medicine

## 2021-01-15 DIAGNOSIS — I10 Essential (primary) hypertension: Secondary | ICD-10-CM

## 2021-01-15 MED ORDER — LOSARTAN POTASSIUM 50 MG PO TABS
ORAL_TABLET | Freq: Every day | ORAL | 2 refills | Status: DC
  Filled 2021-01-15: qty 30, 30d supply, fill #0
  Filled 2021-02-20: qty 30, 30d supply, fill #1
  Filled 2021-03-26: qty 30, 30d supply, fill #2

## 2021-01-15 MED FILL — Rosuvastatin Calcium Tab 40 MG: ORAL | 30 days supply | Qty: 30 | Fill #3 | Status: AC

## 2021-01-15 MED FILL — Ezetimibe Tab 10 MG: ORAL | 30 days supply | Qty: 30 | Fill #3 | Status: AC

## 2021-01-15 NOTE — Telephone Encounter (Signed)
Requested Prescriptions  Pending Prescriptions Disp Refills  . losartan (COZAAR) 50 MG tablet 30 tablet 2    Sig: TAKE 1 TABLET (50 MG TOTAL) BY MOUTH DAILY.     Cardiovascular:  Angiotensin Receptor Blockers Passed - 01/15/2021  8:30 PM      Passed - Cr in normal range and within 180 days    Creatinine, Ser  Date Value Ref Range Status  09/19/2020 0.82 0.44 - 1.00 mg/dL Final         Passed - K in normal range and within 180 days    Potassium  Date Value Ref Range Status  09/19/2020 4.0 3.5 - 5.1 mmol/L Final         Passed - Patient is not pregnant      Passed - Last BP in normal range    BP Readings from Last 1 Encounters:  12/18/20 126/74         Passed - Valid encounter within last 6 months    Recent Outpatient Visits          2 months ago Type 2 diabetes mellitus with other circulatory complication, without long-term current use of insulin (HCC)   [facility] [place], Odette Horns, MD   5 months ago Annual physical exam   [facility] [place], [place], MD   6 months ago Type 2 diabetes mellitus with other circulatory complication, without long-term current use of insulin (HCC)   [facility] [place], [place], MD   8 months ago Type 2 diabetes mellitus with other circulatory complication, without long-term current use of insulin [facility])   [facility] [place], Cornelius Moras, RPH-CPP   9 months ago Encounter for medication review   [facility] [place], Cornelius Moras, RPH-CPP      Future Appointments            In 3 weeks Hoy Register, MD [facility]

## 2021-01-16 ENCOUNTER — Other Ambulatory Visit: Payer: Self-pay

## 2021-01-17 ENCOUNTER — Other Ambulatory Visit: Payer: Self-pay

## 2021-01-21 MED FILL — Metoprolol Succinate Tab ER 24HR 25 MG (Tartrate Equiv): ORAL | 30 days supply | Qty: 30 | Fill #3 | Status: AC

## 2021-01-22 ENCOUNTER — Other Ambulatory Visit: Payer: Self-pay

## 2021-01-29 ENCOUNTER — Other Ambulatory Visit: Payer: Self-pay

## 2021-01-30 ENCOUNTER — Other Ambulatory Visit: Payer: Self-pay

## 2021-01-30 MED ORDER — AMOXICILLIN-POT CLAVULANATE 875-125 MG PO TABS
ORAL_TABLET | ORAL | 0 refills | Status: DC
  Filled 2021-01-30: qty 20, 10d supply, fill #0

## 2021-01-31 ENCOUNTER — Telehealth: Payer: Self-pay | Admitting: Family Medicine

## 2021-01-31 NOTE — Telephone Encounter (Signed)
Pt is calling because she is out of her empagliflozin (JARDIANCE) 10 MG TABS tablet [[id number]]  She took her last one today. She recieves a 90 day supply. But had discussed dropping the dosage on the Jardiance and increasing the dosage on Rx #: [id number] Dulaglutide (TRULICITY) 1.5 MG/0.5ML SOPN [[id number]] .  Pt has appt 02/05/21. But is not sure if Dr. Alvis Lemmings wants her to go that long without the Jardiance. The Trulicity is not scheduled to be taken until next Tuesday. Please advise pt is wanting to know if the trulity is going to be increased since the Jardiance will be d/c. Pt had discussed this with Dr. Alvis Lemmings due to increased yeast infections.

## 2021-01-31 NOTE — Telephone Encounter (Signed)
That decision will need to be made at her office visit. She can take whatever she has until then.

## 2021-02-03 ENCOUNTER — Other Ambulatory Visit: Payer: Self-pay

## 2021-02-05 ENCOUNTER — Ambulatory Visit: Payer: 59 | Attending: Family Medicine | Admitting: Family Medicine

## 2021-02-05 ENCOUNTER — Other Ambulatory Visit: Payer: Self-pay

## 2021-02-05 ENCOUNTER — Encounter: Payer: Self-pay | Admitting: Family Medicine

## 2021-02-05 VITALS — BP 135/69 | HR 60 | Ht <= 58 in | Wt 199.6 lb

## 2021-02-05 DIAGNOSIS — E1159 Type 2 diabetes mellitus with other circulatory complications: Secondary | ICD-10-CM

## 2021-02-05 DIAGNOSIS — Z23 Encounter for immunization: Secondary | ICD-10-CM

## 2021-02-05 LAB — POCT GLYCOSYLATED HEMOGLOBIN (HGB A1C): HbA1c, POC (controlled diabetic range): 6.6 % (ref 0.0–7.0)

## 2021-02-05 LAB — GLUCOSE, POCT (MANUAL RESULT ENTRY): POC Glucose: 96 mg/dl (ref 70–99)

## 2021-02-05 MED ORDER — TRULICITY 3 MG/0.5ML ~~LOC~~ SOAJ
3.0000 mg | SUBCUTANEOUS | 6 refills | Status: DC
  Filled 2021-02-05: qty 2, 28d supply, fill #0
  Filled 2021-03-07: qty 2, 28d supply, fill #1
  Filled 2021-04-04: qty 2, 28d supply, fill #2
  Filled 2021-05-05: qty 2, 28d supply, fill #3
  Filled 2021-06-02 – 2021-06-03 (×2): qty 2, 28d supply, fill #4
  Filled 2021-07-01 (×2): qty 2, 28d supply, fill #0
  Filled 2021-07-01: qty 2, 28d supply, fill #5
  Filled 2021-07-28: qty 2, 28d supply, fill #1

## 2021-02-05 NOTE — Progress Notes (Signed)
Subjective:  Patient ID: Nicole Barry, female    DOB: November 21, 1955  Age: 65 y.o. MRN: [id number]  CC: Diabetes   HPI Nicole Barry is a 65 y.o. year old female with a history of  hypertension, type 2 diabetes mellitus (6.4), hyperlipidemia, NSTEMI status post DES to LAD, anxiety and depression who presents today for chronic disease management.  Interval History: She quit taking Jardiance due to recurrent yeast infections.  At her last visit we had discussed increasing her dose of Trulicity for additional weight loss benefit.  Fasting sugars have been in the low 100s and she denies hypoglycemic episodes.  Denies presence of neuropathic symptoms.  Cardiology took her off Plavix in anticipation of her colonoscopy.  Denies chest pain, dyspnea. Her Colonoscopy comes up on 03/21/2021.  She is interested in receiving the pneumococcal vaccine as well as a flu shot. Past Medical History:  Diagnosis Date   Acute diverticulitis 05/17/2014   Anxiety    CAD (coronary artery disease)    Depression    Diverticulitis 05/17/2014   HTN (hypertension)    Hyperlipidemia LDL goal <70    Non-ST elevation (NSTEMI) myocardial infarction ([place]) 12/05/2019   Normocytic anemia 05/19/2014   NSTEMI (non-ST elevated myocardial infarction) ([place])    12/05/19: DES-LAD   Sepsis ([place]) 05/19/2014   Type 2 diabetes mellitus with hyperosmolarity without coma, without long-term current use of insulin ([place])    Unstable angina ([place]) 12/05/2019    Past Surgical History:  Procedure Laterality Date   BREAST SURGERY     CESAREAN SECTION     CORONARY STENT INTERVENTION N/A 12/05/2019   Procedure: CORONARY STENT INTERVENTION;  Surgeon: Burnell Blanks, MD;  Location: [facility];  Service: Cardiovascular;  Laterality: N/A;   LEFT HEART CATH AND CORONARY ANGIOGRAPHY N/A 12/05/2019   Procedure: LEFT HEART CATH AND CORONARY ANGIOGRAPHY;  Surgeon: Burnell Blanks, MD;  Location: [facility];  Service:  Cardiovascular;  Laterality: N/A;   rotator cull surg     lt shoulder    Family History  Problem Relation Age of Onset   Heart attack Father    Hypertension Sister    Heart attack Brother    Diabetes Sister    Colon cancer Neg Hx     Allergies  Allergen Reactions   Sulfa Antibiotics Rash    Outpatient Medications Prior to Visit  Medication Sig Dispense Refill   albuterol (VENTOLIN HFA) 108 (90 Base) MCG/ACT inhaler INHALE 2 PUFFS INTO THE LUNGS EVERY 6 (SIX) HOURS AS NEEDED FOR WHEEZING OR SHORTNESS OF BREATH. 8.5 g 2   amoxicillin-clavulanate (AUGMENTIN) 875-125 MG tablet 1 tablet Orally twice a day 10 day(s) 20 tablet 0   aspirin EC 81 MG EC tablet Take 1 tablet (81 mg total) by mouth daily. Swallow whole. 30 tablet 11   escitalopram (LEXAPRO) 20 MG tablet TAKE 1 TABLET (20 MG TOTAL) BY MOUTH DAILY. 30 tablet 6   ezetimibe (ZETIA) 10 MG tablet TAKE 1 TABLET (10 MG TOTAL) BY MOUTH DAILY. 90 tablet 3   fluticasone (FLONASE) 50 MCG/ACT nasal spray Place 1 spray into both nostrils daily.     furosemide (LASIX) 20 MG tablet Take 1 tablet (20 mg total) by mouth daily. 30 tablet 2   hydrOXYzine (ATARAX/VISTARIL) 25 MG tablet TAKE 1 TABLET (25 MG TOTAL) BY MOUTH 3 (THREE) TIMES DAILY AS NEEDED. 60 tablet 1   loratadine (CLARITIN) 10 MG tablet Take 10 mg by mouth daily.  losartan (COZAAR) 50 MG tablet TAKE 1 TABLET (50 MG TOTAL) BY MOUTH DAILY. 30 tablet 2   metoprolol succinate (TOPROL-XL) 25 MG 24 hr tablet TAKE 1 TABLET (25 MG TOTAL) BY MOUTH DAILY. 90 tablet 3   nitroGLYCERIN (NITROSTAT) 0.4 MG SL tablet Place 1 tablet (0.4 mg total) under the tongue every 5 (five) minutes as needed for chest pain. 25 tablet 3   pantoprazole (PROTONIX) 40 MG tablet Take 1 tablet (40 mg total) by mouth daily. 30 tablet 2   rosuvastatin (CRESTOR) 40 MG tablet TAKE 1 TABLET (40 MG TOTAL) BY MOUTH DAILY. 90 tablet 3   Dulaglutide (TRULICITY) 1.5 FD/7.4UZ SOPN INJECT 1.5 MG INTO THE SKIN ONCE A WEEK. 2  mL 0   clopidogrel (PLAVIX) 75 MG tablet TAKE 1 TABLET (75 MG TOTAL) BY MOUTH DAILY. (Patient not taking: Reported on 02/05/2021) 90 tablet 3   empagliflozin (JARDIANCE) 10 MG TABS tablet Take 1 tablet (10 mg total) by mouth daily before breakfast. (Patient not taking: Reported on 02/05/2021) 30 tablet 6   No facility-administered medications prior to visit.     ROS Review of Systems  Constitutional:  Negative for activity change, appetite change and fatigue.  HENT:  Negative for congestion, sinus pressure and sore throat.   Eyes:  Negative for visual disturbance.  Respiratory:  Negative for cough, chest tightness, shortness of breath and wheezing.   Cardiovascular:  Negative for chest pain and palpitations.  Gastrointestinal:  Negative for abdominal distention, abdominal pain and constipation.  Endocrine: Negative for polydipsia.  Genitourinary:  Negative for dysuria and frequency.  Musculoskeletal:  Negative for arthralgias and back pain.  Skin:  Negative for rash.  Neurological:  Negative for tremors, light-headedness and numbness.  Hematological:  Does not bruise/bleed easily.  Psychiatric/Behavioral:  Negative for agitation and behavioral problems.    Objective:  BP 135/69   Pulse 60   Ht '4\' 10"'  (1.473 m)   Wt 199 lb 9.6 oz (90.5 kg)   SpO2 96%   BMI 41.72 kg/m   BP/Weight 02/05/2021 12/18/2020 1/46/0479  Systolic BP 987 215 872  Diastolic BP 69 74 74  Wt. (Lbs) 199.6 201.4 199.2  BMI 41.72 42.09 41.63      Physical Exam Constitutional:      Appearance: She is well-developed.  Cardiovascular:     Rate and Rhythm: Normal rate.     Heart sounds: Normal heart sounds. No murmur heard. Pulmonary:     Effort: Pulmonary effort is normal.     Breath sounds: Normal breath sounds. No wheezing or rales.  Chest:     Chest wall: No tenderness.  Abdominal:     General: Bowel sounds are normal. There is no distension.     Palpations: Abdomen is soft. There is no mass.      Tenderness: There is no abdominal tenderness.  Musculoskeletal:        General: Normal range of motion.     Right lower leg: No edema.     Left lower leg: No edema.  Neurological:     Mental Status: She is alert and oriented to person, place, and time.  Psychiatric:        Mood and Affect: Mood normal.    CMP Latest Ref Rng & Units 09/19/2020 07/10/2020 04/10/2020  Glucose 70 - 99 mg/dL 99 100(H) 126(H)  BUN 8 - 23 mg/dL '15 12 17  ' Creatinine 0.44 - 1.00 mg/dL 0.82 0.78 0.97  Sodium 135 - 145 mmol/L 141 142 145(H)  Potassium 3.5 - 5.1 mmol/L 4.0 4.2 4.3  Chloride 98 - 111 mmol/L 102 99 105  CO2 22 - 32 mmol/L '31 28 25  ' Calcium 8.9 - 10.3 mg/dL 9.5 9.5 9.6  Total Protein 6.5 - 8.1 g/dL 6.9 7.3 7.0  Total Bilirubin 0.3 - 1.2 mg/dL 0.4 0.4 0.3  Alkaline Phos 38 - 126 U/L 52 76 87  AST 15 - 41 U/L '17 19 20  ' ALT 0 - 44 U/L '20 21 29    ' Lipid Panel     Component Value Date/Time   CHOL 156 03/20/2020 1114   TRIG 153 (H) 03/20/2020 1114   HDL 40 03/20/2020 1114   CHOLHDL 3.9 03/20/2020 1114   CHOLHDL 6.2 12/06/2019 0310   VLDL 64 (H) 12/06/2019 0310   LDLCALC 89 03/20/2020 1114    CBC    Component Value Date/Time   WBC 4.9 09/19/2020 1852   RBC 4.98 09/19/2020 1852   HGB 15.0 09/19/2020 1852   HGB 13.7 01/08/2020 1030   HCT 44.8 09/19/2020 1852   HCT 41.3 01/08/2020 1030   PLT 282 09/19/2020 1852   PLT 394 01/08/2020 1030   MCV 90.0 09/19/2020 1852   MCV 92 01/08/2020 1030   MCH 30.1 09/19/2020 1852   MCHC 33.5 09/19/2020 1852   RDW 12.3 09/19/2020 1852   RDW 12.5 01/08/2020 1030   LYMPHSABS 1.8 09/19/2020 1852   LYMPHSABS 1.5 01/08/2020 1030   MONOABS 0.5 09/19/2020 1852   EOSABS 0.2 09/19/2020 1852   EOSABS 0.3 01/08/2020 1030   BASOSABS 0.0 09/19/2020 1852   BASOSABS 0.0 01/08/2020 1030    Lab Results  Component Value Date   HGBA1C 6.6 02/05/2021    Assessment & Plan:  1. Type 2 diabetes mellitus with other circulatory complication, without long-term  current use of insulin (HCC) Controlled with A1c of 6.6 I have increased dose of Trulicity and discontinue Jardiance given recurrent yeast infections with the latter. Counseled on Diabetic diet, my plate method, 353 minutes of moderate intensity exercise/week Blood sugar logs with fasting goals of 80-120 mg/dl, random of less than 180 and in the event of sugars less than 60 mg/dl or greater than 400 mg/dl encouraged to notify the clinic. Advised on the need for annual eye exams, annual foot exams, Pneumonia vaccine. - POCT glucose (manual entry) - POCT glycosylated hemoglobin (Hb A1C) - Lipid panel; Future - CMP14+EGFR; Future - Dulaglutide (TRULICITY) 3 IR/4.4RX SOPN; Inject 3 mg as directed once a week.  Dispense: 2 mL; Refill: 6  2. Need for influenza vaccination - Flu Vaccine QUAD 81moIM (Fluarix, Fluzone & Alfiuria Quad PF)  3. Need for pneumococcal vaccine - Pneumococcal polysaccharide vaccine 23-valent greater than or equal to 2yo subcutaneous/IM   Health Care Maintenance: Upcoming colonoscopy in 03/21/2021 Meds ordered this encounter  Medications   Dulaglutide (TRULICITY) 3 MVQ/0.0QQSOPN    Sig: Inject 3 mg as directed once a week.    Dispense:  2 mL    Refill:  6    Dose increase.  Discontinue Jardiance     Follow-up: Return in about 6 months (around 08/08/2021) for Medical conditions.       ECharlott Rakes MD, FAAFP. [facility] [place]  02/05/2021, 2:14 PM

## 2021-02-10 ENCOUNTER — Other Ambulatory Visit: Payer: 59

## 2021-02-20 MED FILL — Escitalopram Oxalate Tab 20 MG (Base Equiv): ORAL | 30 days supply | Qty: 30 | Fill #1 | Status: AC

## 2021-02-20 MED FILL — Metoprolol Succinate Tab ER 24HR 25 MG (Tartrate Equiv): ORAL | 30 days supply | Qty: 30 | Fill #4 | Status: AC

## 2021-02-20 MED FILL — Rosuvastatin Calcium Tab 40 MG: ORAL | 30 days supply | Qty: 30 | Fill #4 | Status: AC

## 2021-02-20 MED FILL — Ezetimibe Tab 10 MG: ORAL | 30 days supply | Qty: 30 | Fill #4 | Status: AC

## 2021-02-21 ENCOUNTER — Other Ambulatory Visit: Payer: Self-pay

## 2021-02-25 ENCOUNTER — Other Ambulatory Visit: Payer: Self-pay | Admitting: Physician Assistant

## 2021-02-25 DIAGNOSIS — R109 Unspecified abdominal pain: Secondary | ICD-10-CM

## 2021-02-25 DIAGNOSIS — K5792 Diverticulitis of intestine, part unspecified, without perforation or abscess without bleeding: Secondary | ICD-10-CM

## 2021-02-27 ENCOUNTER — Other Ambulatory Visit: Payer: Self-pay

## 2021-02-27 MED FILL — Rosuvastatin Calcium Tab 40 MG: ORAL | 30 days supply | Qty: 30 | Fill #5 | Status: CN

## 2021-02-27 MED FILL — Ezetimibe Tab 10 MG: ORAL | 30 days supply | Qty: 30 | Fill #5 | Status: CN

## 2021-03-07 ENCOUNTER — Other Ambulatory Visit: Payer: Self-pay

## 2021-03-10 ENCOUNTER — Other Ambulatory Visit: Payer: Self-pay

## 2021-03-11 ENCOUNTER — Other Ambulatory Visit: Payer: Self-pay

## 2021-03-18 ENCOUNTER — Other Ambulatory Visit: Payer: Self-pay

## 2021-03-18 ENCOUNTER — Ambulatory Visit
Admission: RE | Admit: 2021-03-18 | Discharge: 2021-03-18 | Disposition: A | Discharge status: Home / Self Care | Payer: 59 | Source: Ambulatory Visit | Attending: Physician Assistant | Admitting: Physician Assistant

## 2021-03-18 DIAGNOSIS — K5792 Diverticulitis of intestine, part unspecified, without perforation or abscess without bleeding: Secondary | ICD-10-CM

## 2021-03-18 DIAGNOSIS — R109 Unspecified abdominal pain: Secondary | ICD-10-CM

## 2021-03-18 MED ORDER — IOPAMIDOL (ISOVUE-300) INJECTION 61%
100.0000 mL | Freq: Once | INTRAVENOUS | Status: AC | PRN
  Administered 2021-03-18: 100 mL via INTRAVENOUS

## 2021-03-24 ENCOUNTER — Other Ambulatory Visit: Payer: Self-pay

## 2021-03-26 ENCOUNTER — Other Ambulatory Visit: Payer: Self-pay | Admitting: Family Medicine

## 2021-03-26 MED FILL — Rosuvastatin Calcium Tab 40 MG: ORAL | 30 days supply | Qty: 30 | Fill #5 | Status: AC

## 2021-03-26 MED FILL — Ezetimibe Tab 10 MG: ORAL | 30 days supply | Qty: 30 | Fill #5 | Status: CN

## 2021-03-26 MED FILL — Escitalopram Oxalate Tab 20 MG (Base Equiv): ORAL | 30 days supply | Qty: 30 | Fill #2 | Status: AC

## 2021-03-26 MED FILL — Metoprolol Succinate Tab ER 24HR 25 MG (Tartrate Equiv): ORAL | 30 days supply | Qty: 30 | Fill #5 | Status: AC

## 2021-03-27 ENCOUNTER — Other Ambulatory Visit: Payer: Self-pay

## 2021-03-27 NOTE — Telephone Encounter (Signed)
Requested medications are due for refill today.  yes  Requested medications are on the active medications list.  yes  Last refill. 12/13/2020 for both  Future visit scheduled.   yes  Notes to clinic.  Rx expired on 03/23/2021.

## 2021-03-28 ENCOUNTER — Other Ambulatory Visit: Payer: Self-pay

## 2021-03-28 MED ORDER — PANTOPRAZOLE SODIUM 40 MG PO TBEC
40.0000 mg | DELAYED_RELEASE_TABLET | Freq: Every day | ORAL | 2 refills | Status: DC
  Filled 2021-03-28: qty 30, 30d supply, fill #0
  Filled 2021-05-03: qty 30, 30d supply, fill #1
  Filled 2021-06-02: qty 30, 30d supply, fill #2

## 2021-03-28 MED ORDER — FUROSEMIDE 20 MG PO TABS
20.0000 mg | ORAL_TABLET | Freq: Every day | ORAL | 2 refills | Status: DC
  Filled 2021-03-28: qty 30, 30d supply, fill #0
  Filled 2021-05-03: qty 30, 30d supply, fill #1
  Filled 2021-06-02: qty 30, 30d supply, fill #2

## 2021-04-04 ENCOUNTER — Other Ambulatory Visit: Payer: Self-pay

## 2021-04-04 ENCOUNTER — Other Ambulatory Visit: Payer: Self-pay | Admitting: Family Medicine

## 2021-04-04 ENCOUNTER — Telehealth: Payer: Self-pay | Admitting: Internal Medicine

## 2021-04-04 NOTE — Telephone Encounter (Signed)
*  STAT* If patient is at the pharmacy, call can be transferred to refill team.   1. Which medications need to be refilled? (please list name of each medication and dose if known)  2. Which pharmacy/location (including street and city if local pharmacy) is medication to be sent to[facility]  3. Do they need a 30 day or 90 day supply? 90   Explain to patient that medicaiton has been marked out unsure if dr wants her to continue to take it

## 2021-04-05 NOTE — Telephone Encounter (Signed)
Requested Prescriptions  Pending Prescriptions Disp Refills  . ezetimibe (ZETIA) 10 MG tablet 90 tablet 3    Sig: TAKE 1 TABLET (10 MG TOTAL) BY MOUTH DAILY.     There is no refill protocol information for this order     NOT ON CURRENT MED LIST

## 2021-04-07 ENCOUNTER — Other Ambulatory Visit: Payer: Self-pay

## 2021-04-09 ENCOUNTER — Other Ambulatory Visit: Payer: Self-pay

## 2021-04-09 MED ORDER — SACCHAROMYCES BOULARDII 250 MG PO CAPS
ORAL_CAPSULE | ORAL | 4 refills | Status: DC
  Filled 2021-11-24 – 2022-03-17 (×2): qty 180, fill #0

## 2021-05-03 ENCOUNTER — Other Ambulatory Visit: Payer: Self-pay | Admitting: Family Medicine

## 2021-05-03 ENCOUNTER — Other Ambulatory Visit: Payer: Self-pay | Admitting: Internal Medicine

## 2021-05-03 DIAGNOSIS — F419 Anxiety disorder, unspecified: Secondary | ICD-10-CM

## 2021-05-03 DIAGNOSIS — I10 Essential (primary) hypertension: Secondary | ICD-10-CM

## 2021-05-03 DIAGNOSIS — F32A Depression, unspecified: Secondary | ICD-10-CM

## 2021-05-03 MED FILL — Rosuvastatin Calcium Tab 40 MG: ORAL | 30 days supply | Qty: 30 | Fill #6 | Status: AC

## 2021-05-03 NOTE — Telephone Encounter (Signed)
Requested medication (s) are due for refill today: yes  Requested medication (s) are on the active medication list: expired but present  Last refill:  04/10/20   Future visit scheduled: yes  Notes to clinic:  Prescription expired 04/17/21   Requested Prescriptions  Pending Prescriptions Disp Refills   escitalopram (LEXAPRO) 20 MG tablet 30 tablet 6    Sig: TAKE 1 TABLET (20 MG TOTAL) BY MOUTH DAILY.     Psychiatry:  Antidepressants - SSRI Passed - 05/03/2021  8:43 AM      Passed - Completed PHQ-2 or PHQ-9 in the last 360 days      Passed - Valid encounter within last 6 months    Recent Outpatient Visits           2 months ago Type 2 diabetes mellitus with other circulatory complication, without long-term current use of insulin (HCC)   [facility] [place], [place], MD   5 months ago Type 2 diabetes mellitus with other circulatory complication, without long-term current use of insulin (HCC)   [facility] [place], Odette [place]s, MD   8 months ago Annual physical exam   [facility] [place], Odette Horns, MD   9 months ago Type 2 diabetes mellitus with other circulatory complication, without long-term current use of insulin (HCC)   [facility] [place], Odette [place]s, MD   12 months ago Type 2 diabetes mellitus with other circulatory complication, without long-term current use of insulin [facility])   [facility] [facility] [place], Cornelius Moras, RPH-CPP       Future Appointments             In 3 months Hoy Register, MD [facility]

## 2021-05-03 NOTE — Telephone Encounter (Signed)
Requested medication (s) are due for refill today: yes  Requested medication (s) are on the active medication list: yes  Last refill:  01/15/21 #30 2 RF  Future visit scheduled: yes  Notes to clinic:  overdue lab work (was due in October)   Requested Prescriptions  Pending Prescriptions Disp Refills   losartan (COZAAR) 50 MG tablet 30 tablet 2    Sig: TAKE 1 TABLET (50 MG TOTAL) BY MOUTH DAILY.     Cardiovascular:  Angiotensin Receptor Blockers Failed - 05/03/2021  8:43 AM      Failed - Cr in normal range and within 180 days    Creatinine, Ser  Date Value Ref Range Status  09/19/2020 0.82 0.44 - 1.00 mg/dL Final          Failed - K in normal range and within 180 days    Potassium  Date Value Ref Range Status  09/19/2020 4.0 3.5 - 5.1 mmol/L Final          Passed - Patient is not pregnant      Passed - Last BP in normal range    BP Readings from Last 1 Encounters:  02/05/21 135/69          Passed - Valid encounter within last 6 months    Recent Outpatient Visits           2 months ago Type 2 diabetes mellitus with other circulatory complication, without long-term current use of insulin (HCC)   [facility] [place], [place], MD   5 months ago Type 2 diabetes mellitus with other circulatory complication, without long-term current use of insulin (HCC)   [facility] [place], Odette Horns, MD   8 months ago Annual physical exam   [facility] [place], [place], MD   9 months ago Type 2 diabetes mellitus with other circulatory complication, without long-term current use of insulin (HCC)   [facility] [place], [place], MD   12 months ago Type 2 diabetes mellitus with other circulatory complication, without long-term current use of insulin [facility])   [facility] [place], Cornelius Moras, RPH-CPP       Future Appointments             In 3  months Hoy Register, MD [facility]

## 2021-05-05 ENCOUNTER — Other Ambulatory Visit: Payer: Self-pay

## 2021-05-05 MED ORDER — LOSARTAN POTASSIUM 50 MG PO TABS
ORAL_TABLET | Freq: Every day | ORAL | 2 refills | Status: DC
  Filled 2021-05-05: qty 30, 30d supply, fill #0
  Filled 2021-06-02: qty 30, 30d supply, fill #1
  Filled 2021-07-01: qty 30, 30d supply, fill #2
  Filled 2021-07-01: qty 30, 30d supply, fill #0

## 2021-05-05 MED ORDER — ESCITALOPRAM OXALATE 20 MG PO TABS
ORAL_TABLET | Freq: Every day | ORAL | 2 refills | Status: DC
  Filled 2021-05-05: qty 30, 30d supply, fill #0
  Filled 2021-06-02: qty 30, 30d supply, fill #1
  Filled 2021-07-01: qty 30, 30d supply, fill #2
  Filled 2021-07-01: qty 30, 30d supply, fill #0

## 2021-05-05 MED ORDER — METOPROLOL SUCCINATE ER 25 MG PO TB24
ORAL_TABLET | Freq: Every day | ORAL | 0 refills | Status: DC
  Filled 2021-05-05: qty 30, 30d supply, fill #0
  Filled 2021-06-02: qty 30, 30d supply, fill #1
  Filled 2021-07-01: qty 30, 30d supply, fill #2
  Filled 2021-07-01: qty 30, 30d supply, fill #0

## 2021-05-06 ENCOUNTER — Other Ambulatory Visit: Payer: Self-pay

## 2021-05-26 LAB — HM DIABETES EYE EXAM

## 2021-06-02 ENCOUNTER — Other Ambulatory Visit: Payer: Self-pay | Admitting: Family Medicine

## 2021-06-02 ENCOUNTER — Other Ambulatory Visit: Payer: Self-pay

## 2021-06-02 DIAGNOSIS — E785 Hyperlipidemia, unspecified: Secondary | ICD-10-CM

## 2021-06-02 MED ORDER — ROSUVASTATIN CALCIUM 40 MG PO TABS
ORAL_TABLET | Freq: Every day | ORAL | 1 refills | Status: DC
  Filled 2021-06-02 – 2021-07-01 (×2): qty 31, 31d supply, fill #0
  Filled 2021-07-01: qty 31, 31d supply, fill #1

## 2021-06-03 ENCOUNTER — Other Ambulatory Visit (HOSPITAL_BASED_OUTPATIENT_CLINIC_OR_DEPARTMENT_OTHER): Payer: Self-pay

## 2021-06-03 ENCOUNTER — Ambulatory Visit: Payer: Self-pay | Admitting: *Deleted

## 2021-06-03 NOTE — Telephone Encounter (Signed)
Covid + and would like antiviral and cough medication called to pharmacy.  Patient concerned d/t other health conditions and window to receive medication.   Sx onset Sunday 06/01/2021  Scheduled with Provider Tanda Rockers, NP at [facility].

## 2021-06-03 NOTE — Telephone Encounter (Signed)
I attempted to return her call.   She had called in earlier today.   She went to a funeral on [address] [place]. And 4 people there had Covid.  Sunday she began to feel achy, headache and have a runny nose.   She took 2 home covid tests and they both were positive.   She has heart problems and is a diabetic so is requesting to start on an anti viral for Covid.  She had the 1st 2 vaccines.    She also wants to know how long to quarantine.     She prefers to use [employer]

## 2021-06-03 NOTE — Telephone Encounter (Signed)
°  Chief Complaint: Covid positive , home test x 2 Symptoms: Body aches, subjective fever,headache, runny Frequency: Onset Sunday Pertinent Negatives: Patient denies CP,sore throat,sinus issues,SOB (Did have SOB Sunday.) Disposition: [] ED /[] Urgent Care (no appt availability in office) / [] Appointment(In office/virtual)/ []  [facility]/ [x] Home Care/ [] Refused Recommended Disposition  Additional Notes: HOme care advise given, reviewed guidelines for self isolation. Discussed symptoms that warrant an ED eval. Pt verbalizes understanding. Pt is requesting oral anti viral be called in to [facility]. Pt is High Risk per protocol.  Please advise: [phone]         Reason for Disposition  [1] HIGH RISK patient AND [2] influenza is widespread in the community AND [3] ONE OR MORE respiratory symptoms: cough, sore throat, runny or stuffy nose  Answer Assessment - Initial Assessment Questions 1. COVID-19 DIAGNOSIS: "Who made your COVID-19 diagnosis?" "Was it confirmed by a positive lab test or self-test?" If not diagnosed by a doctor (or NP/PA), ask "Are there lots of cases (community spread) where you live?" Note: See public health department website, if unsure.     Home test x 2 2. COVID-19 EXPOSURE: "Was there any known exposure to COVID before the symptoms began?" CDC Definition of close contact: within 6 feet (2 meters) for a total of 15 minutes or more over a 24-hour period.      Yes Thursday night at [place] home. 3. ONSET: "When did the COVID-19 symptoms start?"      Sunday 4. WORST SYMPTOM: "What is your worst symptom?" (e.g., cough, fever, shortness of breath, muscle aches)     Body aches 5. COUGH: "Do you have a cough?" If Yes, ask: "How bad is the cough?"       Dry cough 6. FEVER: "Do you have a fever?" If Yes, ask: "What is your temperature, how was it measured, and when did it start?"     Subjective fever sunday 7. RESPIRATORY STATUS: "Describe your breathing?" (e.g.,  shortness of breath, wheezing, unable to speak)      Some wheezing last night, SOB Sunday, better today 8. BETTER-SAME-WORSE: "Are you getting better, staying the same or getting worse compared to yesterday?"  If getting worse, ask, "In what way?"     Worse with the body aches 9. HIGH RISK DISEASE: "Do you have any chronic medical problems?" (e.g., asthma, heart or lung disease, weak immune system, obesity, etc.)     Yes 10. VACCINE: "Have you had the COVID-19 vaccine?" If Yes, ask: "Which one, how many shots, when did you get it?"       2 vaccine, spring 2021 Pfizer. 11. BOOSTER: "Have you received your COVID-19 booster?" If Yes, ask: "Which one and when did you get it?"       No 13. OTHER SYMPTOMS: "Do you have any other symptoms?"  (e.g., chills, fatigue, headache, loss of smell or taste, muscle pain, sore throat)       Headache, runny nose, body aches, sneezing 14. O2 SATURATION MONITOR:  "Do you use an oxygen saturation monitor (pulse oximeter) at home?" If Yes, ask "What is your reading (oxygen level) today?" "What is your usual oxygen saturation reading?" (e.g., 95%)       NA  Protocols used: Coronavirus (COVID-19) Diagnosed or Suspected-A-AH

## 2021-06-04 ENCOUNTER — Other Ambulatory Visit: Payer: Self-pay

## 2021-06-04 ENCOUNTER — Encounter: Payer: Self-pay | Admitting: Nurse Practitioner

## 2021-06-04 ENCOUNTER — Telehealth (INDEPENDENT_AMBULATORY_CARE_PROVIDER_SITE_OTHER): Payer: 59 | Admitting: Nurse Practitioner

## 2021-06-04 DIAGNOSIS — U071 COVID-19: Secondary | ICD-10-CM

## 2021-06-04 MED ORDER — MOLNUPIRAVIR EUA 200MG CAPSULE
4.0000 | ORAL_CAPSULE | Freq: Two times a day (BID) | ORAL | 0 refills | Status: AC
  Filled 2021-06-04: qty 40, 5d supply, fill #0

## 2021-06-04 MED ORDER — PROMETHAZINE-DM 6.25-15 MG/5ML PO SYRP
2.5000 mL | ORAL_SOLUTION | Freq: Four times a day (QID) | ORAL | 0 refills | Status: AC | PRN
  Filled 2021-06-04: qty 70, 7d supply, fill #0

## 2021-06-04 NOTE — Progress Notes (Signed)
Virtual Visit via Telephone Note  I connected with Nicole Barry on 06/04/21 at  8:20 AM EST by telephone and verified that I am speaking with the correct person using two identifiers.  Location: Patient: home Provider: office   I discussed the limitations, risks, security and privacy concerns of performing an evaluation and management service by telephone and the availability of in person appointments. I also discussed with the patient that there may be a patient responsible charge related to this service. The patient expressed understanding and agreed to proceed.   History of Present Illness:  Patient presents today for sick visit through telephone visit.  Patient states that she tested positive for COVID with her home test on Monday.  She states that her symptoms started this past Sunday.  Patient is still within the window to receive oral antiviral treatment.  Patient is currently having symptoms of cough, headaches, muscle aches, congestion, weakness.  She did purchase a pulse ox and states that her oxygen level has been within normal range. Denies f/c/s, n/v/d, hemoptysis, PND, chest pain or edema.      Observations/Objective:  Vitals with BMI 02/05/2021 01/03/2021 12/18/2020  Height 4\' 10"  4\' 10"  4\' 10"   Weight 199 lbs 10 oz 201 lbs 201 lbs 6 oz  BMI 41.73 42.02 42.1  Systolic 135 - 126  Diastolic 69 - 74  Pulse 60 - 70      Assessment and Plan:  Covid 19 Cough:   Stay well hydrated  Stay active  Deep breathing exercises  May take tylenol or fever or pain  May take mucinex twice daily  Will order cough syrup   You are being prescribed MOLNUPIRAVIR for COVID-19 infection.   [facility] ([address] [place], [address] [place])  Monday through Friday 8a-5:30p  Please call the pharmacy or go through the drive through vs going inside if you are picking up the mediation yourself to prevent further spread. If prescribed to  a [facility] affiliated pharmacy, a pharmacist will bring the medication out to your car.   ADMINISTRATION INSTRUCTIONS: Take with or without food. Swallow the tablets whole. Don't chew, crush, or break the medications because it might not work as well  For each dose of the medication, you should be taking FOUR tablets at one time, TWICE a day   Finish your full five-day course of Molnupiravir even if you feel better before you're done. Stopping this medication too early can make it less effective to prevent severe illness related to COVID19.    Molnupiravir is prescribed for YOU ONLY. Don't share it with others, even if they have similar symptoms as you. This medication might not be right for everyone.   Make sure to take steps to protect yourself and others while you're taking this medication in order to get well soon and to prevent others from getting sick with COVID-19.   **If you are of childbearing potential (any gender) - it is advised to not get pregnant while taking this medication and recommended that condoms are used for female partners the next 3 months after taking the medication out of extreme caution    COMMON SIDE EFFECTS: Diarrhea Nausea  Dizziness    If your COVID-19 symptoms get worse, get medical help right away. Call 911 if you experience symptoms such as worsening cough, trouble breathing, chest pain that doesn't go away, confusion, a hard time staying awake, and pale or blue-colored skin. This medication won't prevent all  COVID-19 cases from getting worse.       Follow up:  Follow up if needed     I discussed the assessment and treatment plan with the patient. The patient was provided an opportunity to ask questions and all were answered. The patient agreed with the plan and demonstrated an understanding of the instructions.   The patient was advised to call back or seek an in-person evaluation if the symptoms worsen or if the condition fails to improve  as anticipated.  I provided 23 minutes of non-face-to-face time during this encounter.   Ivonne Andrew, NP

## 2021-06-04 NOTE — Patient Instructions (Signed)
Covid 19 Cough:   Stay well hydrated  Stay active  Deep breathing exercises  May take tylenol or fever or pain  May take mucinex twice daily  Will order cough syrup   You are being prescribed MOLNUPIRAVIR for COVID-19 infection.   [facility] ([address] [place], [place] [address][phone])  Monday through Friday 8a-5:30p  Please call the pharmacy or go through the drive through vs going inside if you are picking up the mediation yourself to prevent further spread. If prescribed to a [facility] affiliated pharmacy, a pharmacist will bring the medication out to your car.   ADMINISTRATION INSTRUCTIONS: Take with or without food. Swallow the tablets whole. Don't chew, crush, or break the medications because it might not work as well  For each dose of the medication, you should be taking FOUR tablets at one time, TWICE a day   Finish your full five-day course of Molnupiravir even if you feel better before you're done. Stopping this medication too early can make it less effective to prevent severe illness related to COVID19.    Molnupiravir is prescribed for YOU ONLY. Don't share it with others, even if they have similar symptoms as you. This medication might not be right for everyone.   Make sure to take steps to protect yourself and others while you're taking this medication in order to get well soon and to prevent others from getting sick with COVID-19.   **If you are of childbearing potential (any gender) - it is advised to not get pregnant while taking this medication and recommended that condoms are used for female partners the next 3 months after taking the medication out of extreme caution    COMMON SIDE EFFECTS: Diarrhea Nausea  Dizziness    If your COVID-19 symptoms get worse, get medical help right away. Call 911 if you experience symptoms such as worsening cough, trouble breathing, chest pain that doesn't go away, confusion,  a hard time staying awake, and pale or blue-colored skin. This medication won't prevent all COVID-19 cases from getting worse.       Follow up:  Follow up if needed

## 2021-07-01 ENCOUNTER — Other Ambulatory Visit: Payer: Self-pay

## 2021-07-01 ENCOUNTER — Other Ambulatory Visit: Payer: Self-pay | Admitting: Family Medicine

## 2021-07-01 ENCOUNTER — Other Ambulatory Visit (HOSPITAL_BASED_OUTPATIENT_CLINIC_OR_DEPARTMENT_OTHER): Payer: Self-pay

## 2021-07-01 MED ORDER — FUROSEMIDE 20 MG PO TABS
20.0000 mg | ORAL_TABLET | Freq: Every day | ORAL | 2 refills | Status: DC
  Filled 2021-07-01 (×2): qty 30, 30d supply, fill #0

## 2021-07-01 MED ORDER — PANTOPRAZOLE SODIUM 40 MG PO TBEC
40.0000 mg | DELAYED_RELEASE_TABLET | Freq: Every day | ORAL | 2 refills | Status: DC
  Filled 2021-07-01 (×2): qty 30, 30d supply, fill #0

## 2021-07-01 NOTE — Telephone Encounter (Signed)
Requested Prescriptions  Pending Prescriptions Disp Refills   furosemide (LASIX) 20 MG tablet 30 tablet 2    Sig: Take 1 tablet (20 mg total) by mouth daily.     Cardiovascular:  Diuretics - Loop Passed - 07/01/2021  7:16 AM      Passed - K in normal range and within 360 days    Potassium  Date Value Ref Range Status  09/19/2020 4.0 3.5 - 5.1 mmol/L Final         Passed - Ca in normal range and within 360 days    Calcium  Date Value Ref Range Status  09/19/2020 9.5 8.9 - 10.3 mg/dL Final         Passed - Na in normal range and within 360 days    Sodium  Date Value Ref Range Status  09/19/2020 141 135 - 145 mmol/L Final  07/10/2020 142 134 - 144 mmol/L Final         Passed - Cr in normal range and within 360 days    Creatinine, Ser  Date Value Ref Range Status  09/19/2020 0.82 0.44 - 1.00 mg/dL Final         Passed - Last BP in normal range    BP Readings from Last 1 Encounters:  02/05/21 135/69         Passed - Valid encounter within last 6 months    Recent Outpatient Visits          4 months ago Type 2 diabetes mellitus with other circulatory complication, without long-term current use of insulin (HCC)   [facility] [place], [place]vin, MD   7 months ago Type 2 diabetes mellitus with other circulatory complication, without long-term current use of insulin (HCC)   [facility] [place], Odette Horns, MD   10 months ago Annual physical exam   [facility] [place], [place], MD   11 months ago Type 2 diabetes mellitus with other circulatory complication, without long-term current use of insulin (HCC)   [facility] [place], [place], MD   1 year ago Type 2 diabetes mellitus with other circulatory complication, without long-term current use of insulin [facility])   [facility] [place], Cornelius Moras, RPH-CPP      Future Appointments             In 1 month Hoy Register, MD [facility]            pantoprazole (PROTONIX) 40 MG tablet 30 tablet 2    Sig: Take 1 tablet (40 mg total) by mouth daily.     Gastroenterology: Proton Pump Inhibitors Passed - 07/01/2021  7:16 AM      Passed - Valid encounter within last 12 months    Recent Outpatient Visits          4 months ago Type 2 diabetes mellitus with other circulatory complication, without long-term current use of insulin (HCC)   [facility] [place], [place], MD   7 months ago Type 2 diabetes mellitus with other circulatory complication, without long-term current use of insulin (HCC)   [facility] [place], Odette Horns, MD   10 months ago Annual physical exam   [facility] [place], Odette Horns, MD   11 months ago Type 2 diabetes mellitus with other circulatory complication, without long-term current use of insulin (HCC)  [facility] [place], [place], MD   1 year ago Type 2 diabetes mellitus with other circulatory complication, without long-term current use of insulin [facility])   [facility] [place], Cornelius Moras, RPH-CPP      Future Appointments            In 1 month Hoy Register, MD [facility]

## 2021-07-07 ENCOUNTER — Other Ambulatory Visit: Payer: Self-pay

## 2021-07-15 ENCOUNTER — Telehealth (HOSPITAL_BASED_OUTPATIENT_CLINIC_OR_DEPARTMENT_OTHER): Payer: 59 | Admitting: Nurse Practitioner

## 2021-07-15 ENCOUNTER — Other Ambulatory Visit: Payer: Self-pay

## 2021-07-15 DIAGNOSIS — K219 Gastro-esophageal reflux disease without esophagitis: Secondary | ICD-10-CM

## 2021-07-15 DIAGNOSIS — E1159 Type 2 diabetes mellitus with other circulatory complications: Secondary | ICD-10-CM | POA: Diagnosis not present

## 2021-07-15 DIAGNOSIS — F419 Anxiety disorder, unspecified: Secondary | ICD-10-CM | POA: Diagnosis not present

## 2021-07-15 DIAGNOSIS — E785 Hyperlipidemia, unspecified: Secondary | ICD-10-CM

## 2021-07-15 DIAGNOSIS — I10 Essential (primary) hypertension: Secondary | ICD-10-CM

## 2021-07-15 DIAGNOSIS — F32A Depression, unspecified: Secondary | ICD-10-CM

## 2021-07-15 MED ORDER — LOSARTAN POTASSIUM 50 MG PO TABS
50.0000 mg | ORAL_TABLET | Freq: Every day | ORAL | 0 refills | Status: DC
  Filled 2021-07-15: qty 90, 90d supply, fill #0
  Filled 2021-07-28: qty 30, 30d supply, fill #0
  Filled 2021-08-27: qty 30, 30d supply, fill #1
  Filled 2021-09-24: qty 30, 30d supply, fill #2

## 2021-07-15 MED ORDER — METOPROLOL SUCCINATE ER 25 MG PO TB24
25.0000 mg | ORAL_TABLET | Freq: Every day | ORAL | 0 refills | Status: DC
  Filled 2021-07-15: qty 90, 90d supply, fill #0
  Filled 2021-07-28: qty 30, 30d supply, fill #0
  Filled 2021-08-27: qty 30, 30d supply, fill #1
  Filled 2021-09-24: qty 30, 30d supply, fill #2

## 2021-07-15 MED ORDER — FUROSEMIDE 20 MG PO TABS
20.0000 mg | ORAL_TABLET | Freq: Every day | ORAL | 0 refills | Status: DC
  Filled 2021-07-15: qty 90, 90d supply, fill #0
  Filled 2021-07-28: qty 30, 30d supply, fill #0
  Filled 2021-08-27: qty 30, 30d supply, fill #1
  Filled 2021-09-24: qty 30, 30d supply, fill #2

## 2021-07-15 MED ORDER — ROSUVASTATIN CALCIUM 40 MG PO TABS
40.0000 mg | ORAL_TABLET | Freq: Every day | ORAL | 0 refills | Status: DC
  Filled 2021-07-15: qty 90, 90d supply, fill #0
  Filled 2021-07-28: qty 30, 30d supply, fill #0
  Filled 2021-08-27: qty 30, 30d supply, fill #1
  Filled 2021-09-24: qty 30, 30d supply, fill #2

## 2021-07-15 MED ORDER — ALBUTEROL SULFATE HFA 108 (90 BASE) MCG/ACT IN AERS
2.0000 | INHALATION_SPRAY | Freq: Four times a day (QID) | RESPIRATORY_TRACT | 1 refills | Status: AC | PRN
Start: 2021-07-15 — End: ?
  Filled 2021-07-15: qty 8.5, 25d supply, fill #0

## 2021-07-15 MED ORDER — PANTOPRAZOLE SODIUM 40 MG PO TBEC
40.0000 mg | DELAYED_RELEASE_TABLET | Freq: Every day | ORAL | 0 refills | Status: DC
  Filled 2021-07-15: qty 90, 90d supply, fill #0
  Filled 2021-07-28: qty 30, 30d supply, fill #0
  Filled 2021-08-27: qty 30, 30d supply, fill #1
  Filled 2021-09-24: qty 30, 30d supply, fill #2

## 2021-07-15 MED ORDER — ESCITALOPRAM OXALATE 20 MG PO TABS
20.0000 mg | ORAL_TABLET | Freq: Every day | ORAL | 0 refills | Status: DC
Start: 2021-07-15 — End: 2021-10-28
  Filled 2021-07-15: qty 90, 90d supply, fill #0
  Filled 2021-07-28: qty 30, 30d supply, fill #0
  Filled 2021-08-27: qty 30, 30d supply, fill #1
  Filled 2021-09-24: qty 30, 30d supply, fill #2

## 2021-07-15 MED ORDER — TRULICITY 1.5 MG/0.5ML ~~LOC~~ SOAJ
1.5000 mg | SUBCUTANEOUS | 0 refills | Status: AC
  Filled 2021-07-15: qty 2, 28d supply, fill #0

## 2021-07-15 NOTE — Progress Notes (Signed)
Virtual Visit Note Due to national recommendations of social distancing due to [place] 19, virtual visit is felt to be most appropriate for this patient at this time.  I discussed the limitations, risks, security and privacy concerns of performing an evaluation and management service by video and the availability of in person appointments. I also discussed with the patient that there may be a patient responsible charge related to this service. The patient expressed understanding and agreed to proceed.      I connected with Shelby Mattocks on 07/16/21  at  11:10 AM EST  EDT by VIDEO and verified that I am speaking with the correct person using two identifiers.     Location of Patient: Private Residence   Location of Provider: [place] [facility] [place] participating in VIRTUAL visit: Geryl Rankins FNP-BC Shelby Mattocks    History of Present Illness: VIRTUAL visit for: Medication refills She is requesting 90 day refills of her medications as she will be switching employers and will not have insurance coverage for 3 months.  She has a past medical history of Acute diverticulitis (05/17/2014), Anxiety, CAD , Depression, Diverticulitis (05/17/2014), HTN, Hyperlipidemia LDL goal <70, NSTEMI with DES to LAD(12/05/2019), Normocytic anemia (05/19/2014), Sepsis (05/19/2014), DM2, and Unstable angina (12/05/2019).    States she was taken off plavix by Cardiology. She continues on ASA only at this time.    HTN Blood pressure is well controlled with losartan 50 mg daily, toprol XL 25 mg daily, furosemide 20 mg daily.    BP Readings from Last 3 Encounters:  02/05/21 135/69  12/18/20 126/74  11/05/20 131/74    DM 2 Diabetes is well controlled. Unfortunately Trulicity is unavailable at 3mg . Will send Trulicity 1.5 mg to the pharmacy. She states she could not take Jardiance to due to chronic yeast infections. She denies any hyperglycemic or hypoglycemic episodes. LDL is not optimal. She is  prescribed crestor  40 mg daily.  Recent Labs       Lab Results  Component Value Date    HGBA1C 6.6 02/05/2021      Recent Labs       Lab Results  Component Value Date    LDLCALC 89 03/20/2020            Past Medical History:  Diagnosis Date   Acute diverticulitis 05/17/2014   Anxiety     CAD (coronary artery disease)     Depression     Diverticulitis 05/17/2014   HTN (hypertension)     Hyperlipidemia LDL goal <70     Non-ST elevation (NSTEMI) myocardial infarction ([place]) 12/05/2019   Normocytic anemia 05/19/2014   NSTEMI (non-ST elevated myocardial infarction) ([place])      12/05/19: DES-LAD   Sepsis ([place]) 05/19/2014   Type 2 diabetes mellitus with hyperosmolarity without coma, without long-term current use of insulin ([place])     Unstable angina ([place]) 12/05/2019         Past Surgical History:  Procedure Laterality Date   BREAST SURGERY       CESAREAN SECTION       CORONARY STENT INTERVENTION N/A 12/05/2019    Procedure: CORONARY STENT INTERVENTION;  Surgeon: Burnell Blanks, MD;  Location: [facility];  Service: Cardiovascular;  Laterality: N/A;   LEFT HEART CATH AND CORONARY ANGIOGRAPHY N/A 12/05/2019    Procedure: LEFT HEART CATH AND CORONARY ANGIOGRAPHY;  Surgeon: Burnell Blanks, MD;  Location: [facility];  Service: Cardiovascular;  Laterality: N/A;   rotator cull surg        lt shoulder         Family History  Problem Relation Age of Onset   Heart attack Father     Hypertension Sister     Heart attack Brother     Diabetes Sister     Colon cancer Neg Hx      Social History         Socioeconomic History   Marital status: Legally Separated      Spouse name: Not on file   Number of children: Not on file   Years of education: Not on file   Highest education level: Not on file  Occupational History   Not on file  Tobacco Use   Smoking status: Never   Smokeless tobacco: Never  Substance and Sexual Activity   Alcohol use: Yes       Comment: occasional   Drug use: No   Sexual activity: Yes  Other Topics Concern   Not on file  Social History Narrative   Not on file    Social Determinants of Health    Financial Resource Strain: Not on file  Food Insecurity: Not on file  Transportation Needs: Not on file  Physical Activity: Not on file  Stress: Not on file  Social Connections: Not on file      Observations/Objective: Awake, alert and oriented x 3     Review of Systems  Constitutional:  Negative for fever, malaise/fatigue and weight loss.  HENT: Negative.  Negative for nosebleeds.   Eyes: Negative.  Negative for blurred vision, double vision and photophobia.  Respiratory: Negative.  Negative for cough and shortness of breath.   Cardiovascular: Negative.  Negative for chest pain, palpitations and leg swelling.  Gastrointestinal: Negative.  Negative for heartburn, nausea and vomiting.  Musculoskeletal: Negative.  Negative for myalgias.  Neurological: Negative.  Negative for dizziness, focal weakness, seizures and headaches.  Psychiatric/Behavioral: Negative.  Negative for suicidal ideas.    Assessment and Plan: Diagnoses and all orders for this visit:   Essential hypertension -     metoprolol succinate (TOPROL-XL) 25 MG 24 hr tablet; Take 1 tablet (25 mg total) by mouth daily. Please fill as a 90 day supply -     losartan (COZAAR) 50 MG tablet; Take 1 tablet (50 mg total) by mouth daily. Please fill as a 90 day supply -     furosemide (LASIX) 20 MG tablet; Take 1 tablet (20 mg total) by mouth daily. Please fill as a 90 day supply Continue all antihypertensives as prescribed.  Remember to bring in your blood pressure log with you for your follow up appointment.  DASH/Mediterranean Diets are healthier choices for HTN.     Type 2 diabetes mellitus with other circulatory complication, without long-term current use of insulin (HCC) -     Dulaglutide (TRULICITY) 1.5 0000000 SOPN; Inject 1.5 mg into the skin once  a week. Continue blood sugar control as discussed in office today, low carbohydrate diet, and regular physical exercise as tolerated, 150 minutes per week (30 min each day, 5 days per week, or 50 min 3 days per week). Keep blood sugar logs with fasting goal of 90-130 mg/dl, post prandial (after you eat) less than 180.  For Hypoglycemia: BS <60 and Hyperglycemia BS >400; contact the clinic ASAP. Annual eye exams and foot exams are recommended.    Hyperlipidemia LDL goal <70 -     rosuvastatin (CRESTOR) 40  MG tablet; Take 1 tablet (40 mg total) by mouth daily. Please fill as a 90 day supply INSTRUCTIONS: Work on a low fat, heart healthy diet and participate in regular aerobic exercise program by working out at least 150 minutes per week; 5 days a week-30 minutes per day. Avoid red meat/beef/steak,  fried foods. junk foods, sodas, sugary drinks, unhealthy snacking, alcohol and smoking.  Drink at least 80 oz of water per day and monitor your carbohydrate intake daily.     Anxiety and depression -     escitalopram (LEXAPRO) 20 MG tablet; Take 1 tablet (20 mg total) by mouth daily. Please fill as a 90 day supply   GERD without esophagitis -     pantoprazole (PROTONIX) 40 MG tablet; Take 1 tablet (40 mg total) by mouth daily. Please fill as a 90 day supply INSTRUCTIONS: Avoid GERD Triggers: acidic, spicy or fried foods, caffeine, coffee, sodas,  alcohol and chocolate.      Other orders -     albuterol (VENTOLIN HFA) 108 (90 Base) MCG/ACT inhaler; INHALE 2 PUFFS INTO THE LUNGS EVERY 6 (SIX) HOURS AS NEEDED FOR WHEEZING OR SHORTNESS OF BREATH.       Follow Up Instructions Return in about 3 months (around 10/12/2021).      I discussed the assessment and treatment plan with the patient. The patient was provided an opportunity to ask questions and all were answered. The patient agreed with the plan and demonstrated an understanding of the instructions.   The patient was advised to call back or seek an  in-person evaluation if the symptoms worsen or if the condition fails to improve as anticipated.   I provided 13 minutes of face-to-face time during this encounter including median intraservice time, reviewing previous notes, labs, imaging, medications and explaining diagnosis and management.   Gildardo Pounds, FNP-BC

## 2021-07-16 ENCOUNTER — Other Ambulatory Visit: Payer: Self-pay

## 2021-07-16 ENCOUNTER — Encounter: Payer: Self-pay | Admitting: Nurse Practitioner

## 2021-07-16 ENCOUNTER — Ambulatory Visit: Payer: 59 | Attending: Family Medicine

## 2021-07-16 DIAGNOSIS — E1159 Type 2 diabetes mellitus with other circulatory complications: Secondary | ICD-10-CM

## 2021-07-16 NOTE — Progress Notes (Signed)
Virtual Visit Note Due to national recommendations of social distancing due to COVID 19, virtual visit is felt to be most appropriate for this patient at this time.  I discussed the limitations, risks, security and privacy concerns of performing an evaluation and management service by video and the availability of in person appointments. I also discussed with the patient that there may be a patient responsible charge related to this service. The patient expressed understanding and agreed to proceed.    I connected with Nicole Barry on 07/16/21  at  11:10 AM EST  EDT by VIDEO and verified that I am speaking with the correct person using two identifiers.   Location of Patient: Private Residence   Location of Provider: [facility]    Persons participating in VIRTUAL visit: Nicole Denver FNP-BC Nicole Barry    History of Present Illness: VIRTUAL visit for: Medication refills She is requesting 90 day refills of her medications as she will be switching employers and will not have insurance coverage for 3 months.  She has a past medical history of Acute diverticulitis (05/17/2014), Anxiety, CAD , Depression, Diverticulitis (05/17/2014), HTN, Hyperlipidemia LDL goal <70, NSTEMI with DES to LAD(12/05/2019), Normocytic anemia (05/19/2014), Sepsis (05/19/2014), DM2, and Unstable angina (12/05/2019).   States she was taken off plavix by Cardiology. She continues on ASA only at this time.   HTN Blood pressure is well controlled with losartan 50 mg daily, toprol XL 25 mg daily, furosemide 20 mg daily. BP Readings from Last 3 Encounters:  02/05/21 135/69  12/18/20 126/74  11/05/20 131/74    DM 2 Diabetes is well controlled. Unfortunately Trulicity is unavailable at 3mg . Will send Trulicity 1.5 mg to the pharmacy. She states she could not take Jardiance to due to chronic yeast infections. She denies any hyperglycemic or hypoglycemic episodes. LDL is not optimal. She is prescribed  crestor  40 mg daily.  Lab Results  Component Value Date   HGBA1C 6.6 02/05/2021    Lab Results  Component Value Date   LDLCALC 89 03/20/2020     Past Medical History:  Diagnosis Date   Acute diverticulitis 05/17/2014   Anxiety    CAD (coronary artery disease)    Depression    Diverticulitis 05/17/2014   HTN (hypertension)    Hyperlipidemia LDL goal <70    Non-ST elevation (NSTEMI) myocardial infarction (HCC) 12/05/2019   Normocytic anemia 05/19/2014   NSTEMI (non-ST elevated myocardial infarction) (HCC)    12/05/19: DES-LAD   Sepsis (HCC) 05/19/2014   Type 2 diabetes mellitus with hyperosmolarity without coma, without long-term current use of insulin (HCC)    Unstable angina (HCC) 12/05/2019    Past Surgical History:  Procedure Laterality Date   BREAST SURGERY     CESAREAN SECTION     CORONARY STENT INTERVENTION N/A 12/05/2019   Procedure: CORONARY STENT INTERVENTION;  Surgeon: Kathleene Hazel, MD;  [facility];  Service: Cardiovascular;  Laterality: N/A;   LEFT HEART CATH AND CORONARY ANGIOGRAPHY N/A 12/05/2019   Procedure: LEFT HEART CATH AND CORONARY ANGIOGRAPHY;  Surgeon: Kathleene Hazel, MD;  [facility]: [facility];  Service: Cardiovascular;  Laterality: N/A;   rotator cull surg     lt shoulder    Family History  Problem Relation Age of Onset   Heart attack Father    Hypertension Sister    Heart attack Brother    Diabetes Sister    Colon cancer Neg Hx  Social History   Socioeconomic History   Marital status: Legally Separated    Spouse name: Not on file   Number of children: Not on file   Years of education: Not on file   Highest education level: Not on file  Occupational History   Not on file  Tobacco Use   Smoking status: Never   Smokeless tobacco: Never  Substance and Sexual Activity   Alcohol use: Yes    Comment: occasional   Drug use: No   Sexual activity: Yes  Other Topics Concern   Not on file  Social  History Narrative   Not on file   Social Determinants of Health   Financial Resource Strain: Not on file  Food Insecurity: Not on file  Transportation Needs: Not on file  Physical Activity: Not on file  Stress: Not on file  Social Connections: Not on file     Observations/Objective: Awake, alert and oriented x 3   Review of Systems  Constitutional:  Negative for fever, malaise/fatigue and weight loss.  HENT: Negative.  Negative for nosebleeds.   Eyes: Negative.  Negative for blurred vision, double vision and photophobia.  Respiratory: Negative.  Negative for cough and shortness of breath.   Cardiovascular: Negative.  Negative for chest pain, palpitations and leg swelling.  Gastrointestinal: Negative.  Negative for heartburn, nausea and vomiting.  Musculoskeletal: Negative.  Negative for myalgias.  Neurological: Negative.  Negative for dizziness, focal weakness, seizures and headaches.  Psychiatric/Behavioral: Negative.  Negative for suicidal ideas.    Assessment and Plan: Diagnoses and all orders for this visit:  Essential hypertension -     metoprolol succinate (TOPROL-XL) 25 MG 24 hr tablet; Take 1 tablet (25 mg total) by mouth daily. Please fill as a 90 day supply -     losartan (COZAAR) 50 MG tablet; Take 1 tablet (50 mg total) by mouth daily. Please fill as a 90 day supply -     furosemide (LASIX) 20 MG tablet; Take 1 tablet (20 mg total) by mouth daily. Please fill as a 90 day supply Continue all antihypertensives as prescribed.  Remember to bring in your blood pressure log with you for your follow up appointment.  DASH/Mediterranean Diets are healthier choices for HTN.    Type 2 diabetes mellitus with other circulatory complication, without long-term current use of insulin (HCC) -     Dulaglutide (TRULICITY) 1.5 0000000 SOPN; Inject 1.5 mg into the skin once a week. Continue blood sugar control as discussed in office today, low carbohydrate diet, and regular physical  exercise as tolerated, 150 minutes per week (30 min each day, 5 days per week, or 50 min 3 days per week). Keep blood sugar logs with fasting goal of 90-130 mg/dl, post prandial (after you eat) less than 180.  For Hypoglycemia: BS <60 and Hyperglycemia BS >400; contact the clinic ASAP. Annual eye exams and foot exams are recommended.   Hyperlipidemia LDL goal <70 -     rosuvastatin (CRESTOR) 40 MG tablet; Take 1 tablet (40 mg total) by mouth daily. Please fill as a 90 day supply INSTRUCTIONS: Work on a low fat, heart healthy diet and participate in regular aerobic exercise program by working out at least 150 minutes per week; 5 days a week-30 minutes per day. Avoid red meat/beef/steak,  fried foods. junk foods, sodas, sugary drinks, unhealthy snacking, alcohol and smoking.  Drink at least 80 oz of water per day and monitor your carbohydrate intake daily.    Anxiety and  depression -     escitalopram (LEXAPRO) 20 MG tablet; Take 1 tablet (20 mg total) by mouth daily. Please fill as a 90 day supply  GERD without esophagitis -     pantoprazole (PROTONIX) 40 MG tablet; Take 1 tablet (40 mg total) by mouth daily. Please fill as a 90 day supply INSTRUCTIONS: Avoid GERD Triggers: acidic, spicy or fried foods, caffeine, coffee, sodas,  alcohol and chocolate.    Other orders -     albuterol (VENTOLIN HFA) 108 (90 Base) MCG/ACT inhaler; INHALE 2 PUFFS INTO THE LUNGS EVERY 6 (SIX) HOURS AS NEEDED FOR WHEEZING OR SHORTNESS OF BREATH.     Follow Up Instructions Return in about 3 months (around 10/12/2021).     I discussed the assessment and treatment plan with the patient. The patient was provided an opportunity to ask questions and all were answered. The patient agreed with the plan and demonstrated an understanding of the instructions.   The patient was advised to call back or seek an in-person evaluation if the symptoms worsen or if the condition fails to improve as anticipated.  I provided 13  minutes of face-to-face time during this encounter including median intraservice time, reviewing previous notes, labs, imaging, medications and explaining diagnosis and management.  Gildardo Pounds, FNP-BC

## 2021-07-17 LAB — CMP14+EGFR
ALT: 26 IU/L (ref 0–32)
AST: 23 IU/L (ref 0–40)
Albumin/Globulin Ratio: 1.8 (ref 1.2–2.2)
Albumin: 4.5 g/dL (ref 3.8–4.8)
Alkaline Phosphatase: 75 IU/L (ref 44–121)
BUN/Creatinine Ratio: 10 — ABNORMAL LOW (ref 12–28)
BUN: 8 mg/dL (ref 8–27)
Bilirubin Total: 0.5 mg/dL (ref 0.0–1.2)
CO2: 26 mmol/L (ref 20–29)
Calcium: 9.6 mg/dL (ref 8.7–10.3)
Chloride: 99 mmol/L (ref 96–106)
Creatinine, Ser: 0.84 mg/dL (ref 0.57–1.00)
Globulin, Total: 2.5 g/dL (ref 1.5–4.5)
Glucose: 120 mg/dL — ABNORMAL HIGH (ref 70–99)
Potassium: 4.6 mmol/L (ref 3.5–5.2)
Sodium: 140 mmol/L (ref 134–144)
Total Protein: 7 g/dL (ref 6.0–8.5)
eGFR: 78 mL/min/{1.73_m2} (ref >59)

## 2021-07-17 LAB — LIPID PANEL
Chol/HDL Ratio: 3.5 ratio (ref 0.0–4.4)
Cholesterol, Total: 138 mg/dL (ref 100–199)
HDL: 40 mg/dL (ref >39)
LDL Chol Calc (NIH): 71 mg/dL (ref 0–99)
Triglycerides: 158 mg/dL — ABNORMAL HIGH (ref 0–149)
VLDL Cholesterol Cal: 27 mg/dL (ref 5–40)

## 2021-07-22 ENCOUNTER — Encounter: Payer: Self-pay | Admitting: Family Medicine

## 2021-07-28 ENCOUNTER — Other Ambulatory Visit: Payer: Self-pay

## 2021-07-28 ENCOUNTER — Other Ambulatory Visit: Payer: Self-pay | Admitting: Family Medicine

## 2021-07-28 ENCOUNTER — Other Ambulatory Visit (HOSPITAL_BASED_OUTPATIENT_CLINIC_OR_DEPARTMENT_OTHER): Payer: Self-pay

## 2021-07-28 DIAGNOSIS — E1159 Type 2 diabetes mellitus with other circulatory complications: Secondary | ICD-10-CM

## 2021-07-28 MED ORDER — TRULICITY 3 MG/0.5ML ~~LOC~~ SOAJ
3.0000 mg | SUBCUTANEOUS | 6 refills | Status: DC
  Filled 2021-07-28: qty 2, 28d supply, fill #0
  Filled 2021-08-27: qty 2, 28d supply, fill #1
  Filled 2021-09-24: qty 2, 28d supply, fill #2
  Filled 2021-10-21: qty 2, 28d supply, fill #0
  Filled 2021-11-19: qty 2, 28d supply, fill #1
  Filled 2021-12-12: qty 2, 28d supply, fill #2
  Filled 2022-01-10: qty 2, 28d supply, fill #3

## 2021-07-28 NOTE — Telephone Encounter (Signed)
Patient called, left VM to return the call to the office. Will need to know which Trulicity is needed. Noted last OV note 07/15/21 that Trulicity 3 mg is not available, so the 1.5 mg was sent in. Does she need the 1.5 mg or 3 mg sent?

## 2021-07-28 NOTE — Telephone Encounter (Signed)
Requested Prescriptions  Pending Prescriptions Disp Refills   Dulaglutide (TRULICITY) 3 0000000 SOPN 2 mL 6    Sig: Inject 3 mg as directed once a week.     Endocrinology:  Diabetes - GLP-1 Receptor Agonists Passed - 07/28/2021  1:09 PM      Passed - HBA1C is between 0 and 7.9 and within 180 days    HbA1c, POC (controlled diabetic range)  Date Value Ref Range Status  02/05/2021 6.6 0.0 - 7.0 % Final         Passed - Valid encounter within last 6 months    Recent Outpatient Visits          1 week ago Essential hypertension   [place], [place] W, NP   5 months ago Type 2 diabetes mellitus with other circulatory complication, without long-term current use of insulin ([place])   [place], [place], MD   8 months ago Type 2 diabetes mellitus with other circulatory complication, without long-term current use of insulin ([place])   [place], [place], MD   11 months ago Annual physical exam   [place], Charlane Ferretti, MD   1 year ago Type 2 diabetes mellitus with other circulatory complication, without long-term current use of insulin ([place])   [place], Enobong, MD      Future Appointments            In 2 weeks Charlott Rakes, MD [place]

## 2021-07-28 NOTE — Telephone Encounter (Signed)
Medication Refill - Medication:  Dulaglutide (TRULICITY) 3 0000000 SOPN 2 mL 6 02/05/2021    Sig - Route: Inject 3 mg as directed once a week. - Injection   Sent to pharmacy as: Dulaglutide (TRULICITY) 3 0000000 Solution Pen-injector   Pt needs 90 day supply, only available at [facility] Please send asap. Insurance ends tomorrow !!!!!  Has the patient contacted their pharmacy? Yes.   (Agent: If no, request that the patient contact the pharmacy for the refill. If patient does not wish to contact the pharmacy document the reason why and proceed with request.) (Agent: If yes, when and what did the pharmacy advise?) Dr Margarita Rana stated that would refill for 90 days request 90 days, available only at new pharmacy / [place] call in for 90 day [place]  Preferred Pharmacy (with phone number or street name):  [facility] Phone:  [phone]  Fax:  [phone]     Has the patient been seen for an appointment in the last year OR does the patient have an upcoming appointment? Yes.    Agent: Please be advised that RX refills may take up to 3 business days. We ask that you follow-up with your pharmacy.

## 2021-07-28 NOTE — Addendum Note (Signed)
Addended by: Wilford Corner on: 07/28/2021 01:09 PM   Modules accepted: Orders

## 2021-07-29 ENCOUNTER — Other Ambulatory Visit: Payer: Self-pay

## 2021-08-11 ENCOUNTER — Ambulatory Visit: Payer: 59 | Admitting: Family Medicine

## 2021-08-27 ENCOUNTER — Other Ambulatory Visit: Payer: Self-pay

## 2021-09-02 ENCOUNTER — Other Ambulatory Visit: Payer: Self-pay

## 2021-09-03 ENCOUNTER — Other Ambulatory Visit: Payer: Self-pay

## 2021-09-04 ENCOUNTER — Other Ambulatory Visit: Payer: Self-pay

## 2021-09-05 ENCOUNTER — Telehealth: Payer: Self-pay

## 2021-09-05 ENCOUNTER — Other Ambulatory Visit: Payer: Self-pay

## 2021-09-05 NOTE — Telephone Encounter (Signed)
PA for Trulicity approved until 09/06/22 ?

## 2021-09-24 ENCOUNTER — Other Ambulatory Visit: Payer: Self-pay

## 2021-09-29 ENCOUNTER — Other Ambulatory Visit: Payer: Self-pay

## 2021-09-29 DIAGNOSIS — F419 Anxiety disorder, unspecified: Secondary | ICD-10-CM | POA: Diagnosis not present

## 2021-09-29 DIAGNOSIS — E7800 Pure hypercholesterolemia, unspecified: Secondary | ICD-10-CM | POA: Diagnosis not present

## 2021-09-29 DIAGNOSIS — I10 Essential (primary) hypertension: Secondary | ICD-10-CM | POA: Diagnosis not present

## 2021-09-29 MED ORDER — METHOCARBAMOL 500 MG PO TABS
ORAL_TABLET | ORAL | 0 refills | Status: DC
  Filled 2021-09-29: qty 30, 5d supply, fill #0

## 2021-09-30 ENCOUNTER — Other Ambulatory Visit: Payer: Self-pay

## 2021-10-05 ENCOUNTER — Encounter (HOSPITAL_COMMUNITY): Payer: Self-pay

## 2021-10-05 ENCOUNTER — Ambulatory Visit (HOSPITAL_COMMUNITY)
Admission: EM | Admit: 2021-10-05 | Discharge: 2021-10-05 | Disposition: A | Discharge status: Home / Self Care | Payer: Medicare Other | Attending: Emergency Medicine | Admitting: Emergency Medicine

## 2021-10-05 ENCOUNTER — Ambulatory Visit (INDEPENDENT_AMBULATORY_CARE_PROVIDER_SITE_OTHER): Payer: Medicare Other

## 2021-10-05 DIAGNOSIS — M7989 Other specified soft tissue disorders: Secondary | ICD-10-CM | POA: Diagnosis not present

## 2021-10-05 DIAGNOSIS — M25571 Pain in right ankle and joints of right foot: Secondary | ICD-10-CM

## 2021-10-05 MED ORDER — COLCHICINE 0.6 MG PO TABS
ORAL_TABLET | ORAL | 0 refills | Status: DC
Start: 2021-10-05 — End: 2022-12-07

## 2021-10-05 NOTE — ED Triage Notes (Signed)
C/o right leg pain x 3-4 days. No known injury. ?

## 2021-10-05 NOTE — Discharge Instructions (Addendum)
Prescribed colchicine take as directed and to completion ?HOLD CRESTOR FOR 3 DAYS  ?Follow RICE instructions as attached ?Follow-up with PCP  ?Return or go to the ED if you have any new or worsening symptoms  ?

## 2021-10-05 NOTE — ED Provider Notes (Signed)
?[place] ? ? ?[id number] ?10/05/21 Arrival Time: [id number] ? ? ?Chief Complaint  ?Patient presents with  ? Leg Pain  ? ? ?SUBJECTIVE: ?History from: patient. ?patient ?Nicole Barry is a 66 y.o. female who presented to the urgent care for complaint of right ankle pain for the past 4 days.  She denies any precipitating event.  She localizes the pain to the right ankle.  She describes the pain as constant and achy.  She has tried OTC medication without relief.  Her symptoms are made worse with ROM.  She denies similar symptoms in the past.  She denies chills, fever, nausea, vomiting and diarrhea. ? ?ROS: As per HPI.  All other pertinent ROS negative.    ? ?Past Medical History:  ?Diagnosis Date  ? Acute diverticulitis 05/17/2014  ? Anxiety   ? CAD (coronary artery disease)   ? Depression   ? Diverticulitis 05/17/2014  ? HTN (hypertension)   ? Hyperlipidemia LDL goal <70   ? Non-ST elevation (NSTEMI) myocardial infarction ([place]) 12/05/2019  ? Normocytic anemia 05/19/2014  ? NSTEMI (non-ST elevated myocardial infarction) ([place])   ? 12/05/19: DES-LAD  ? Sepsis ([place]) 05/19/2014  ? Type 2 diabetes mellitus with hyperosmolarity without coma, without long-term current use of insulin ([employer])   ? Unstable angina ([place]) 12/05/2019  ? ?Past Surgical History:  ?Procedure Laterality Date  ? BREAST SURGERY    ? CESAREAN SECTION    ? CORONARY STENT INTERVENTION N/A 12/05/2019  ? Procedure: CORONARY STENT INTERVENTION;  Surgeon: Burnell Blanks, MD;  Location: [facility];  Service: Cardiovascular;  Laterality: N/A;  ? LEFT HEART CATH AND CORONARY ANGIOGRAPHY N/A 12/05/2019  ? Procedure: LEFT HEART CATH AND CORONARY ANGIOGRAPHY;  Surgeon: Burnell Blanks, MD;  Location: [facility];  Service: Cardiovascular;  Laterality: N/A;  ? rotator cull surg    ? lt shoulder  ? ?Allergies  ?Allergen Reactions  ? Sulfa Antibiotics Rash  ? ?No current facility-administered medications on file prior to encounter.  ? ?Current  Outpatient Medications on File Prior to Encounter  ?Medication Sig Dispense Refill  ? albuterol (VENTOLIN HFA) 108 (90 Base) MCG/ACT inhaler INHALE 2 PUFFS INTO THE LUNGS EVERY 6 (SIX) HOURS AS NEEDED FOR WHEEZING OR SHORTNESS OF BREATH. 8.5 g 1  ? aspirin EC 81 MG EC tablet Take 1 tablet (81 mg total) by mouth daily. Swallow whole. 30 tablet 11  ? Dulaglutide (TRULICITY) 1.5 0000000 SOPN Inject 1.5 mg into the skin once a week. 6 mL 0  ? Dulaglutide (TRULICITY) 3 0000000 SOPN Inject 3 mg as directed once a week. 2 mL 6  ? escitalopram (LEXAPRO) 20 MG tablet Take 1 tablet (20 mg total) by mouth daily. Please fill as a 90 day supply 90 tablet 0  ? fluticasone (FLONASE) 50 MCG/ACT nasal spray Place 1 spray into both nostrils daily.    ? furosemide (LASIX) 20 MG tablet Take 1 tablet (20 mg total) by mouth daily. Please fill as a 90 day supply 90 tablet 0  ? loratadine (CLARITIN) 10 MG tablet Take 10 mg by mouth daily.    ? losartan (COZAAR) 50 MG tablet Take 1 tablet (50 mg total) by mouth daily. Please fill as a 90 day supply 90 tablet 0  ? methocarbamol (ROBAXIN) 500 MG tablet Take 1 tablet by mouth every 4 hrs for 5 days 30 tablet 0  ? metoprolol succinate (TOPROL-XL) 25 MG 24 hr tablet Take 1 tablet (25 mg total)  by mouth daily. Please fill as a 90 day supply 90 tablet 0  ? nitroGLYCERIN (NITROSTAT) 0.4 MG SL tablet Place 1 tablet (0.4 mg total) under the tongue every 5 (five) minutes as needed for chest pain. 25 tablet 3  ? pantoprazole (PROTONIX) 40 MG tablet Take 1 tablet (40 mg total) by mouth daily. Please fill as a 90 day supply 90 tablet 0  ? rosuvastatin (CRESTOR) 40 MG tablet Take 1 tablet (40 mg total) by mouth daily. Please fill as a 90 day supply 90 tablet 0  ? saccharomyces boulardii (FLORASTOR) 250 MG capsule 1 capsule Orally twice a day 90 days 180 capsule 4  ? [DISCONTINUED] atorvastatin (LIPITOR) 80 MG tablet Take 1 tablet (80 mg total) by mouth daily. 30 tablet 6  ? [DISCONTINUED] ezetimibe  (ZETIA) 10 MG tablet TAKE 1 TABLET (10 MG TOTAL) BY MOUTH DAILY. 90 tablet 3  ? ?Social History  ? ?Socioeconomic History  ? Marital status: Legally Separated  ?  Spouse name: Not on file  ? Number of children: Not on file  ? Years of education: Not on file  ? Highest education level: Not on file  ?Occupational History  ? Not on file  ?Tobacco Use  ? Smoking status: Never  ? Smokeless tobacco: Never  ?Substance and Sexual Activity  ? Alcohol use: Yes  ?  Comment: occasional  ? Drug use: No  ? Sexual activity: Yes  ?Other Topics Concern  ? Not on file  ?Social History Narrative  ? Not on file  ? ?Social Determinants of Health  ? ?Financial Resource Strain: Not on file  ?Food Insecurity: Not on file  ?Transportation Needs: Not on file  ?Physical Activity: Not on file  ?Stress: Not on file  ?Social Connections: Not on file  ?Intimate Partner Violence: Not on file  ? ?Family History  ?Problem Relation Age of Onset  ? Heart attack Father   ? Hypertension Sister   ? Heart attack Brother   ? Diabetes Sister   ? Colon cancer Neg Hx   ? ? ?OBJECTIVE: ? ?Vitals:  ? 10/05/21 1444  ?BP: 123/65  ?Pulse: 65  ?Resp: 16  ?Temp: 98.6 ?F (37 ?C)  ?SpO2: 100%  ?  ? ?Physical Exam ?Vitals and nursing note reviewed.  ?Constitutional:   ?   General: She is not in acute distress. ?   Appearance: Normal appearance. She is normal weight. She is not ill-appearing, toxic-appearing or diaphoretic.  ?HENT:  ?   Head: Normocephalic.  ?Cardiovascular:  ?   Rate and Rhythm: Normal rate and regular rhythm.  ?   Pulses: Normal pulses.  ?   Heart sounds: Normal heart sounds. No murmur heard. ?  No friction rub. No gallop.  ?Pulmonary:  ?   Effort: Pulmonary effort is normal. No respiratory distress.  ?   Breath sounds: Normal breath sounds. No stridor. No wheezing, rhonchi or rales.  ?Chest:  ?   Chest wall: No tenderness.  ?Musculoskeletal:  ?   Right ankle: Swelling present. No deformity, ecchymosis or lacerations. Tenderness present.  ?   Left  ankle: Normal.  ?   Comments: Neurovascular status intact. Warm to touch as compared to  left ankle.  ?Neurological:  ?   Mental Status: She is alert and oriented to person, place, and time.  ? ? ? ?LABS: ? ?No results found for this or any previous visit (from the past 24 hour(s)).  ? ?RADIOLOGY ? ?DG Ankle Complete Right ? ?  Result Date: 10/05/2021 ?CLINICAL DATA:  Right ankle pain for 3-4 days. EXAM: RIGHT ANKLE - COMPLETE 3+ VIEW COMPARISON:  None available FINDINGS: The ankle mortise is symmetric and intact. Small chronic well corticated ossicle just distal to the medial malleolus. Small plantar calcaneal heel spur. Mild chronic enthesopathic change at the Achilles insertion on the calcaneus. Mild lateral greater than medial soft tissue swelling. No acute fracture or dislocation. IMPRESSION: Small chronic ossicle distal to the medial malleolus and small plantar and posterior calcaneal heel spurs. No acute fracture. Electronically Signed   By: Yvonne Kendall M.D.   On: 10/05/2021 15:48    ? ?Right ankle x-ray is negative for acute bony abnormality including fracture or dislocation.  I have reviewed the x-ray myself and the radiologist interpretation.  I am in agreement with the radiologist interpretation. ? ? ?ASSESSMENT & PLAN: ? ?1. Acute right ankle pain   ? ? ?Meds ordered this encounter  ?Medications  ? colchicine 0.6 MG tablet  ?  Sig: Take 1.2 mg (2 tablet) by mouth x1, then 0.6 mg (1 tablet) by mouth 1 hour later.  Maximum dose 1.8 mg  ?  Dispense:  3 tablet  ?  Refill:  0  ? ? ?Discharge Instructions ? ?Prescribed colchicine take as directed and to completion ?HOLD CRESTOR FOR 3 DAYS ?Follow RICE instructions as attached ?Follow-up with PCP  ?Return or go to the ED if you have any new or worsening symptoms  ? ?Reviewed expectations re: course of current medical issues. Questions answered. ?Outlined signs and symptoms indicating need for more acute intervention. ?Patient verbalized understanding. ?After  Visit Summary given. ? ? ? ? ? ?  ?  ?Emerson Monte, FNP ?10/05/21 1610 ? ?

## 2021-10-14 NOTE — Progress Notes (Signed)
?Cardiology Office Note:   ? ?Date:  10/15/2021  ? ?ID:  Nicole Barry, DOB 29-Dec-1955, MRN [id number] ? ?PCP:  [facility] ?[facility] Cardiologist: Chrystie Nose, MD  ? ?Reason for visit: Follow-up ? ?History of Present Illness:   ? ?Nicole Barry is a 66 y.o. 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   ? ?She was last seen by cardiology in July 2022 for preop assessment.  Patient had DOE which she attributes to her weight gain and deconditioning.  She reported infrequent episodes of upper back squeezing pain which she states is her anginal equivalent.  She underwent low risk stress test.  Her Plavix was discontinued as that she is over 1 year post PCI. ? ?Today, she states she is doing well from a cardiovascular standpoint.  She denies angina - for her upper back tightness and jaw pain.  Denies significant DOE.  She does mention that she gets dizzy with bending over.  She also can break into a sweat and get nauseous when doing heavy yard work.  No symptoms when walking the stores.  She takes Lasix 20 mg daily.  She denies leg swelling, PND, orthopnea & weight gain.  Occasional palpitations she relates to anxiety.  Her mother passed away recently.  She had a recent gout flare.   ? ?When reviewing her other medications, she states that Zetia was not refilled so she did not think she needed it any longer.  She states she was taking it when her lipids were drawn in February with LDL of 71.   ? ?Patient plans on increasing her exercise.  Now that she has turned 89, she will look into the [facility] [employer] sneaker program and water aerobics.  She also got a home bike.  She has knee arthritis that limits her activity. ? ? ?  ?Past Medical History:  ?Diagnosis Date  ? Acute diverticulitis 05/17/2014  ? Anxiety   ? CAD (coronary artery disease)   ? Depression   ? Diverticulitis 05/17/2014  ? HTN  (hypertension)   ? Hyperlipidemia LDL goal <70   ? Non-ST elevation (NSTEMI) myocardial infarction ([facility]) 12/05/2019  ? Normocytic anemia 05/19/2014  ? NSTEMI (non-ST elevated myocardial infarction) ([facility])   ? 12/05/19: DES-LAD  ? Sepsis ([facility]) 05/19/2014  ? Type 2 diabetes mellitus with hyperosmolarity without coma, without long-term current use of insulin ([facility])   ? Unstable angina ([facility]) 12/05/2019  ? ? ?Past Surgical History:  ?Procedure Laterality Date  ? BREAST SURGERY    ? CESAREAN SECTION    ? CORONARY STENT INTERVENTION N/A 12/05/2019  ? Procedure: CORONARY STENT INTERVENTION;  Surgeon: Kathleene Hazel, MD;  [facility];  Service: Cardiovascular;  Laterality: N/A;  ? LEFT HEART CATH AND CORONARY ANGIOGRAPHY N/A 12/05/2019  ? Procedure: LEFT HEART CATH AND CORONARY ANGIOGRAPHY;  Surgeon: Kathleene Hazel, MD;  Location: [facility];  Service: Cardiovascular;  Laterality: N/A;  ? rotator cull surg    ? lt shoulder  ? ? ?Current Medications: ?Current Meds  ?Medication Sig  ? albuterol (VENTOLIN HFA) 108 (90 Base) MCG/ACT inhaler INHALE 2 PUFFS INTO THE LUNGS EVERY 6 (SIX) HOURS AS NEEDED FOR WHEEZING OR SHORTNESS OF BREATH.  ? aspirin EC 81 MG EC tablet Take 1 tablet (81 mg total) by mouth daily. Swallow whole.  ? colchicine 0.6  MG tablet Take 1.2 mg (2 tablet) by mouth x1, then 0.6 mg (1 tablet) by mouth 1 hour later.  Maximum dose 1.8 mg  ? Dulaglutide (TRULICITY) 3 MG/0.5ML SOPN Inject 3 mg as directed once a week.  ? escitalopram (LEXAPRO) 20 MG tablet Take 1 tablet (20 mg total) by mouth daily. Please fill as a 90 day supply  ? fluticasone (FLONASE) 50 MCG/ACT nasal spray Place 1 spray into both nostrils daily.  ? loratadine (CLARITIN) 10 MG tablet Take 10 mg by mouth daily.  ? methocarbamol (ROBAXIN) 500 MG tablet Take 1 tablet by mouth every 4 hrs for 5 days  ? pantoprazole (PROTONIX) 40 MG tablet Take 1 tablet (40 mg total) by mouth daily. Please fill as a 90 day supply  ?  saccharomyces boulardii (FLORASTOR) 250 MG capsule 1 capsule Orally twice a day 90 days  ? [DISCONTINUED] furosemide (LASIX) 20 MG tablet Take 1 tablet (20 mg total) by mouth daily. Please fill as a 90 day supply  ? [DISCONTINUED] losartan (COZAAR) 50 MG tablet Take 1 tablet (50 mg total) by mouth daily. Please fill as a 90 day supply  ? [DISCONTINUED] metoprolol succinate (TOPROL-XL) 25 MG 24 hr tablet Take 1 tablet (25 mg total) by mouth daily. Please fill as a 90 day supply  ? [DISCONTINUED] nitroGLYCERIN (NITROSTAT) 0.4 MG SL tablet Place 1 tablet (0.4 mg total) under the tongue every 5 (five) minutes as needed for chest pain.  ? [DISCONTINUED] rosuvastatin (CRESTOR) 40 MG tablet Take 1 tablet (40 mg total) by mouth daily. Please fill as a 90 day supply  ?  ? ?Allergies:   Sulfa antibiotics  ? ?Social History  ? ?Socioeconomic History  ? Marital status: Legally Separated  ?  Spouse name: Not on file  ? Number of children: Not on file  ? Years of education: Not on file  ? Highest education level: Not on file  ?Occupational History  ? Not on file  ?Tobacco Use  ? Smoking status: Never  ? Smokeless tobacco: Never  ?Substance and Sexual Activity  ? Alcohol use: Yes  ?  Comment: occasional  ? Drug use: No  ? Sexual activity: Yes  ?Other Topics Concern  ? Not on file  ?Social History Narrative  ? Not on file  ? ?Social Determinants of Health  ? ?Financial Resource Strain: Not on file  ?Food Insecurity: Not on file  ?Transportation Needs: Not on file  ?Physical Activity: Not on file  ?Stress: Not on file  ?Social Connections: Not on file  ?  ? ?Family History: ?The patient's family history includes Diabetes in her sister; Heart attack in her brother and father; Hypertension in her sister. There is no history of Colon cancer. ? ?ROS:   ?Please see the history of present illness.    ? ?EKGs/Labs/Other Studies Reviewed:   ? ?Recent Labs: ?07/16/2021: ALT 26; BUN 8; Creatinine, Ser 0.84; Potassium 4.6; Sodium 140  ? ?Recent  Lipid Panel ?Lab Results  ?Component Value Date/Time  ? CHOL 138 07/16/2021 09:06 AM  ? TRIG 158 (H) 07/16/2021 09:06 AM  ? HDL 40 07/16/2021 09:06 AM  ? LDLCALC 71 07/16/2021 09:06 AM  ? ? ?Physical Exam:   ? ?VS:  BP 130/70   Pulse 77   Ht 4\' 10"  (1.473 m)   Wt 199 lb 12.8 oz (90.6 kg)   SpO2 98%   BMI 41.76 kg/m?    ?Orthostatic VS for the past 24 hrs: ? BP- Lying  Pulse- Lying BP- Sitting Pulse- Sitting BP- Standing at 0 minutes Pulse- Standing at 0 minutes  ?10/15/21 1115 148/89 85 152/90 83 158/88 83  ? ? ?Wt Readings from Last 3 Encounters:  ?10/15/21 199 lb 12.8 oz (90.6 kg)  ?02/05/21 199 lb 9.6 oz (90.5 kg)  ?01/03/21 201 lb (91.2 kg)  ?  ? ?GEN:  Well nourished, well developed in no acute distress, obese ?HEENT: Normal ?NECK: No JVD; No carotid bruits ?CARDIAC: RRR, no murmurs, rubs, gallops ?RESPIRATORY:  Clear to auscultation without rales, wheezing or rhonchi  ?ABDOMEN: Soft, non-tender, non-distended ?MUSCULOSKELETAL: No edema; No deformity  ?SKIN: Warm and dry ?NEUROLOGIC:  Alert and oriented ?PSYCHIATRIC:  Normal affect  ? ?  ?ASSESSMENT AND PLAN  ? ?Coronary artery disease, no angina ?-Status post PCI to LAD in June 2021; recovered EF post PCI ?-Low risk stress in 12/2020 ?-Continue aspirin, statin and beta-blocker therapy. ?-She asked about taking a prophylactic SL nitro tablet before exercise.  I do not advise this as I worry it can lower blood pressure and she has these dizzy episodes.  Asked to monitor for angina and we can consider low-dose Imdur if she does have exertional angina.    ?-With recovered EF, recommend changing her Lasix to as needed for edema. ? ?Hypertension, controlled ?-Grade 2 diastolic dysfunction on echo October 2021 ?-Continue current medications. ?-Goal BP is <130/80.  Recommend DASH diet (high in vegetables, fruits, low-fat dairy products, whole grains, poultry, fish, and nuts and low in sweets, sugar-sweetened beverages, and red meats), salt restriction and increase  physical activity. ? ?Hyperlipidemia with goal LDL less than 70 ?-Continue Crestor.  I recommend restarting Zetia as her LDL was over 70 before it was added. ?-Encouraged weight loss and exercise. ?-Discussed choleste

## 2021-10-15 ENCOUNTER — Other Ambulatory Visit: Payer: Self-pay

## 2021-10-15 ENCOUNTER — Ambulatory Visit: Payer: Medicare Other | Admitting: Physician Assistant

## 2021-10-15 ENCOUNTER — Encounter: Payer: Self-pay | Admitting: Physician Assistant

## 2021-10-15 VITALS — BP 130/70 | HR 77 | Ht <= 58 in | Wt 199.8 lb

## 2021-10-15 DIAGNOSIS — I2510 Atherosclerotic heart disease of native coronary artery without angina pectoris: Secondary | ICD-10-CM

## 2021-10-15 DIAGNOSIS — I10 Essential (primary) hypertension: Secondary | ICD-10-CM | POA: Diagnosis not present

## 2021-10-15 DIAGNOSIS — E785 Hyperlipidemia, unspecified: Secondary | ICD-10-CM | POA: Diagnosis not present

## 2021-10-15 DIAGNOSIS — Z9861 Coronary angioplasty status: Secondary | ICD-10-CM

## 2021-10-15 MED ORDER — ROSUVASTATIN CALCIUM 40 MG PO TABS
40.0000 mg | ORAL_TABLET | Freq: Every day | ORAL | 0 refills | Status: DC
  Filled 2021-10-15 – 2021-10-21 (×2): qty 90, 90d supply, fill #0

## 2021-10-15 MED ORDER — METOPROLOL SUCCINATE ER 25 MG PO TB24
25.0000 mg | ORAL_TABLET | Freq: Every day | ORAL | 0 refills | Status: DC
  Filled 2021-10-15 – 2021-10-21 (×2): qty 90, 90d supply, fill #0

## 2021-10-15 MED ORDER — EZETIMIBE 10 MG PO TABS
ORAL_TABLET | Freq: Every day | ORAL | 3 refills | Status: DC
  Filled 2021-10-15 – 2021-10-22 (×2): qty 90, 90d supply, fill #0
  Filled 2021-10-29: qty 30, 30d supply, fill #0
  Filled 2021-11-24: qty 30, 30d supply, fill #1
  Filled 2021-12-26: qty 30, 30d supply, fill #2
  Filled 2022-02-04: qty 30, 30d supply, fill #3
  Filled 2022-03-17: qty 30, 30d supply, fill #4
  Filled 2022-05-08 (×2): qty 90, 90d supply, fill #5
  Filled 2022-09-20: qty 90, 90d supply, fill #6

## 2021-10-15 MED ORDER — FUROSEMIDE 20 MG PO TABS
20.0000 mg | ORAL_TABLET | ORAL | 1 refills | Status: DC | PRN
  Filled 2021-10-15: qty 90, fill #0

## 2021-10-15 MED ORDER — NITROGLYCERIN 0.4 MG SL SUBL
0.4000 mg | SUBLINGUAL_TABLET | SUBLINGUAL | 3 refills | Status: DC | PRN
  Filled 2021-10-15: qty 25, 30d supply, fill #0
  Filled 2021-10-22: qty 25, 10d supply, fill #0
  Filled 2021-10-29: qty 25, 1d supply, fill #0

## 2021-10-15 MED ORDER — LOSARTAN POTASSIUM 50 MG PO TABS
50.0000 mg | ORAL_TABLET | Freq: Every day | ORAL | 0 refills | Status: DC
  Filled 2021-10-15 – 2021-10-21 (×2): qty 90, 90d supply, fill #0

## 2021-10-15 NOTE — Patient Instructions (Signed)
Medication Instructions:  No Changes *If you need a refill on your cardiac medications before your next appointment, please call your pharmacy*   Lab Work: No Labs If you have labs (blood work) drawn today and your tests are completely normal, you will receive your results only by: MyChart Message (if you have MyChart) OR A paper copy in the mail If you have any lab test that is abnormal or we need to change your treatment, we will call you to review the results.   Testing/Procedures: No Testing   Follow-Up: At [facility], you and your health needs are our priority.  As part of our continuing mission to provide you with exceptional heart care, we have created designated Provider Care Teams.  These Care Teams include your primary Cardiologist (physician) and Advanced Practice Providers (APPs -  Physician Assistants and Nurse Practitioners) who all work together to provide you with the care you need, when you need it.  We recommend signing up for the patient portal called "MyChart".  Sign up information is provided on this After Visit Summary.  MyChart is used to connect with patients for Virtual Visits (Telemedicine).  Patients are able to view lab/test results, encounter notes, upcoming appointments, etc.  Non-urgent messages can be sent to your provider as well.   To learn more about what you can do with MyChart, go to [email or web address].    Your next appointment:   1 year(s)  The format for your next appointment:   In Person  Provider:   Kenneth C Hilty, MD       Important Information About Sugar       

## 2021-10-21 ENCOUNTER — Other Ambulatory Visit: Payer: Self-pay | Admitting: Nurse Practitioner

## 2021-10-21 ENCOUNTER — Other Ambulatory Visit: Payer: Self-pay

## 2021-10-21 DIAGNOSIS — F419 Anxiety disorder, unspecified: Secondary | ICD-10-CM

## 2021-10-21 DIAGNOSIS — F32A Depression, unspecified: Secondary | ICD-10-CM

## 2021-10-22 ENCOUNTER — Other Ambulatory Visit: Payer: Self-pay

## 2021-10-28 ENCOUNTER — Other Ambulatory Visit: Payer: Self-pay | Admitting: Nurse Practitioner

## 2021-10-28 DIAGNOSIS — K219 Gastro-esophageal reflux disease without esophagitis: Secondary | ICD-10-CM

## 2021-10-28 DIAGNOSIS — F32A Depression, unspecified: Secondary | ICD-10-CM

## 2021-10-28 DIAGNOSIS — F419 Anxiety disorder, unspecified: Secondary | ICD-10-CM

## 2021-10-29 ENCOUNTER — Other Ambulatory Visit: Payer: Self-pay

## 2021-10-29 MED ORDER — PANTOPRAZOLE SODIUM 40 MG PO TBEC
40.0000 mg | DELAYED_RELEASE_TABLET | Freq: Every day | ORAL | 0 refills | Status: DC
  Filled 2021-10-29: qty 30, 30d supply, fill #0

## 2021-10-29 MED ORDER — ESCITALOPRAM OXALATE 20 MG PO TABS
20.0000 mg | ORAL_TABLET | Freq: Every day | ORAL | 0 refills | Status: DC
  Filled 2021-10-29: qty 30, 30d supply, fill #0

## 2021-10-30 ENCOUNTER — Other Ambulatory Visit: Payer: Self-pay

## 2021-11-19 ENCOUNTER — Other Ambulatory Visit: Payer: Self-pay

## 2021-11-19 IMAGING — DX DG CHEST 1V PORT
1 series · 1 of 1 positions shown · non-contrast
Comparison: 12/05/2019

CLINICAL DATA: Dizziness

EXAM:
PORTABLE CHEST 1 VIEW

[chest]
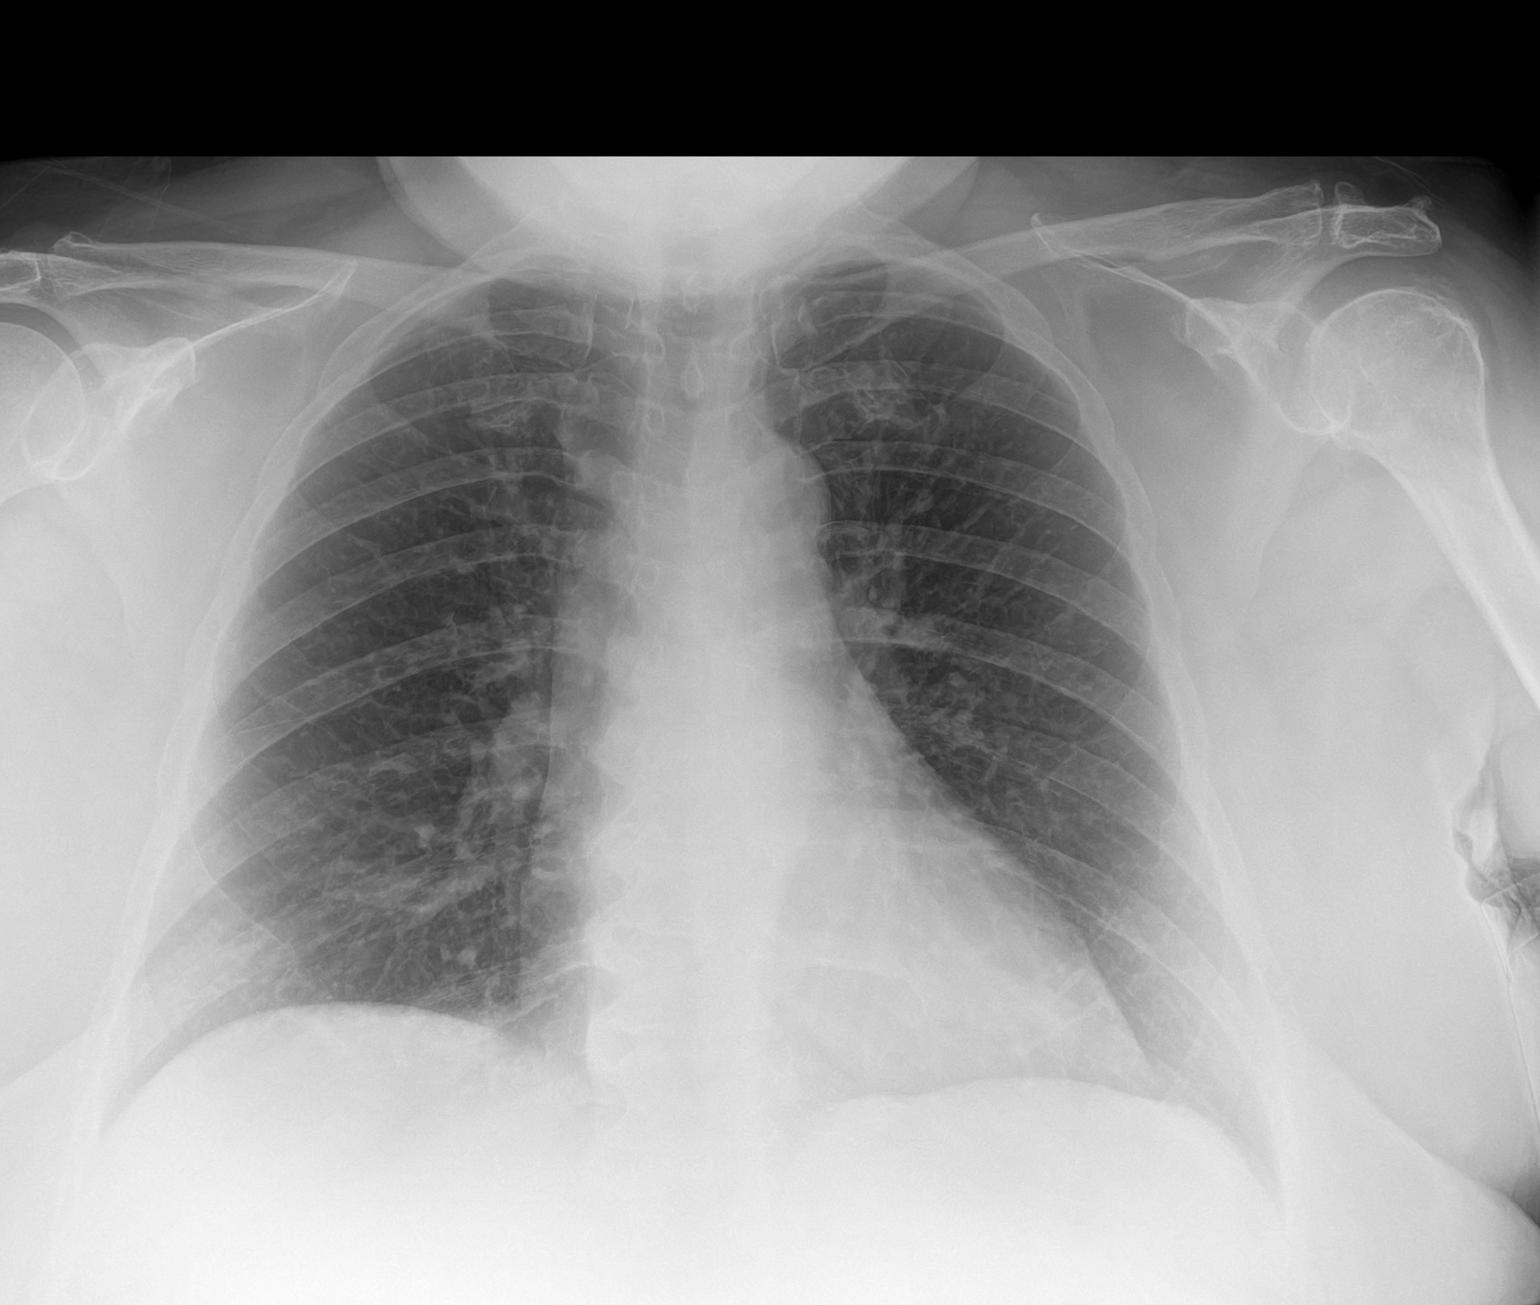

[1 of 1 positions shown; findings below may reference images not displayed]

FINDINGS: Heart and mediastinal contours are within normal limits. No focal
opacities or effusions. No acute bony abnormality.
IMPRESSION: No active disease.

## 2021-11-19 IMAGING — CT CT HEAD W/O CM
3 series · 18 of 40 positions shown, 20 images · non-contrast
Comparison: None.

CLINICAL DATA: Right-sided headache and dizziness.

EXAM:
CT HEAD WITHOUT CONTRAST
TECHNIQUE: Contiguous axial images were obtained from the base of the skull
through the vertex without intravenous contrast.

[Series 2: head bone · axial · 0.43mm/px · z∈[-106,+16]mm · 8 of 77 slices shown]
[im 8/77  bone]
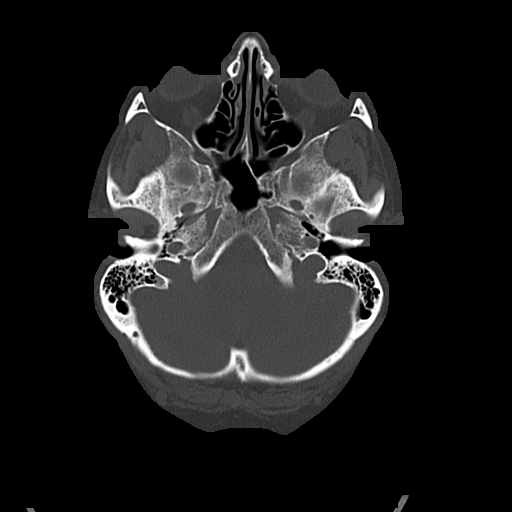
[im 16/77  bone]
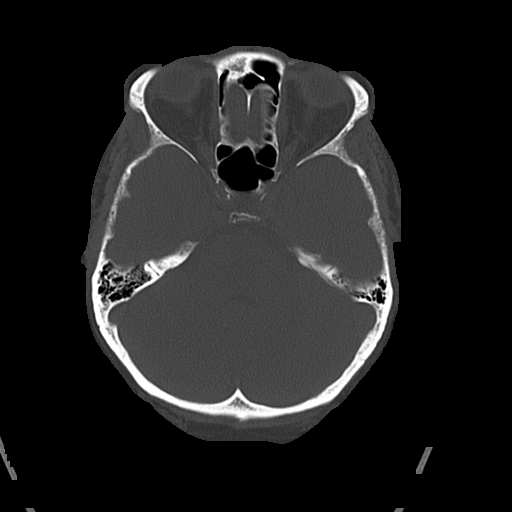
[im 23/77  bone]
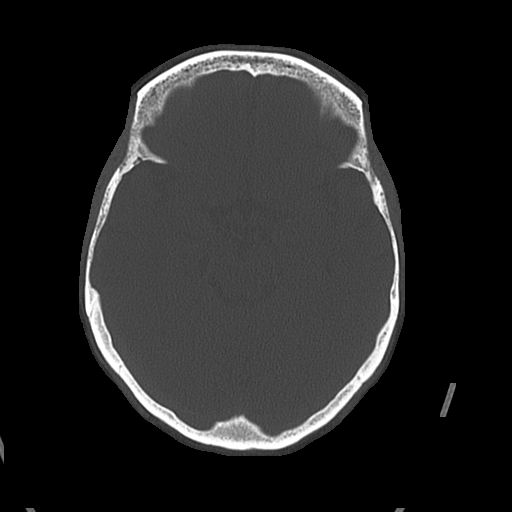
[im 35/77  bone]
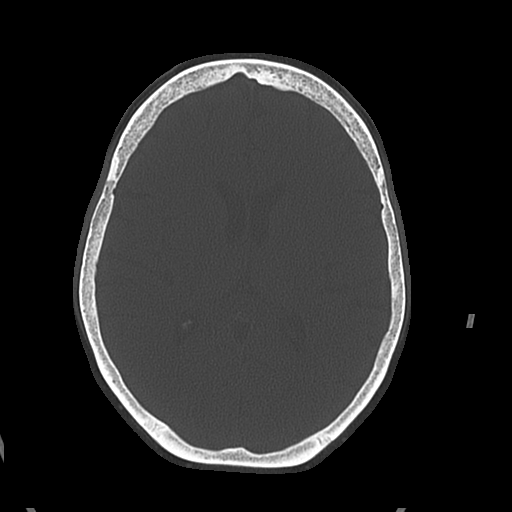
[im 42/77  bone]
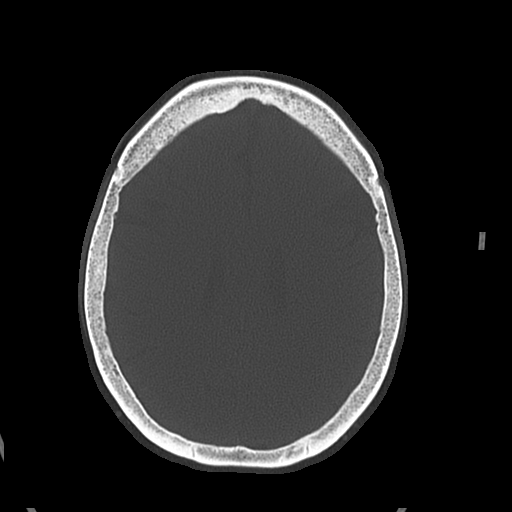
[im 54/77  bone]
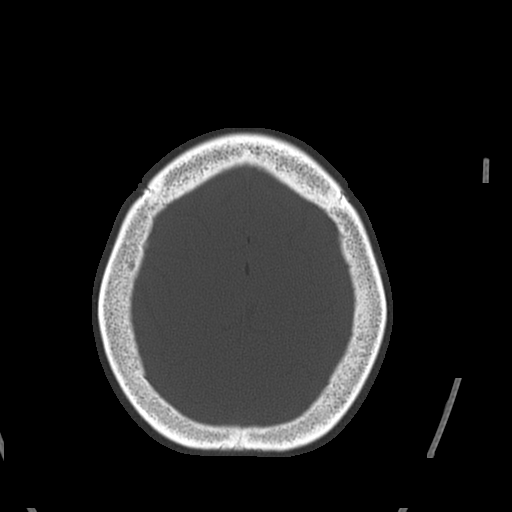
[im 61/77  bone]
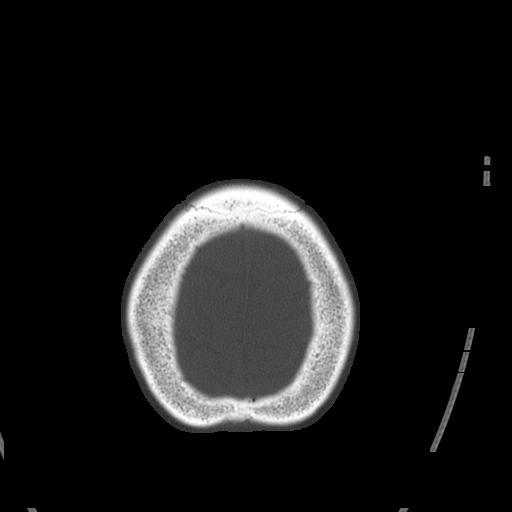
[im 69/77  bone]
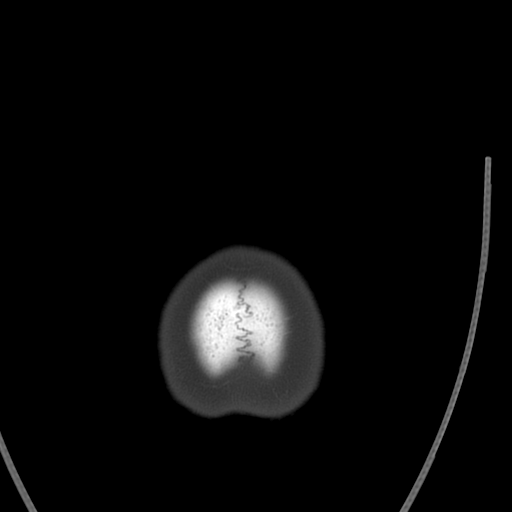

[Series 3: head wo · axial · 0.43mm/px · z∈[-105,+10]mm · 7 of 31 slices shown, 9 images]
[im 4/31  brain]
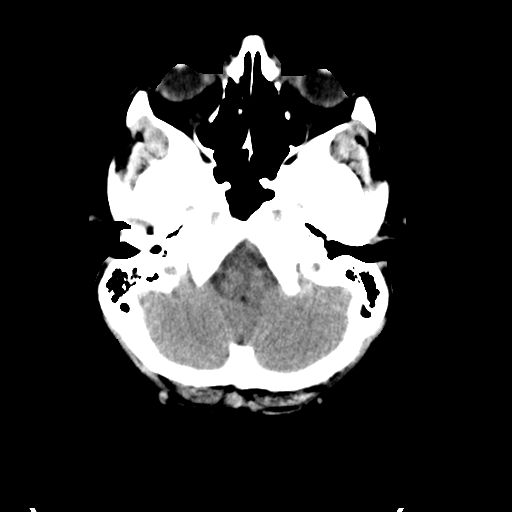
[im 4/31  bone]
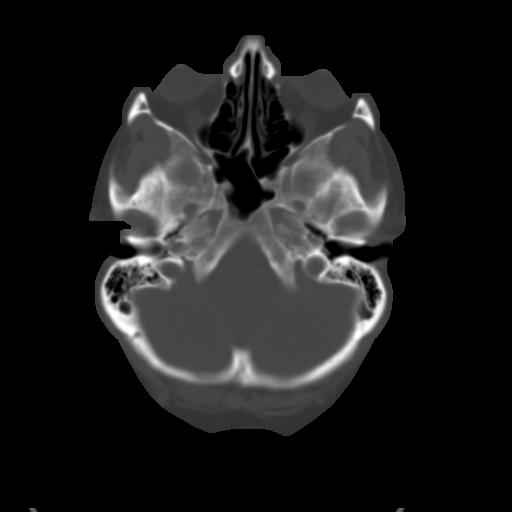
[im 8/31  brain]
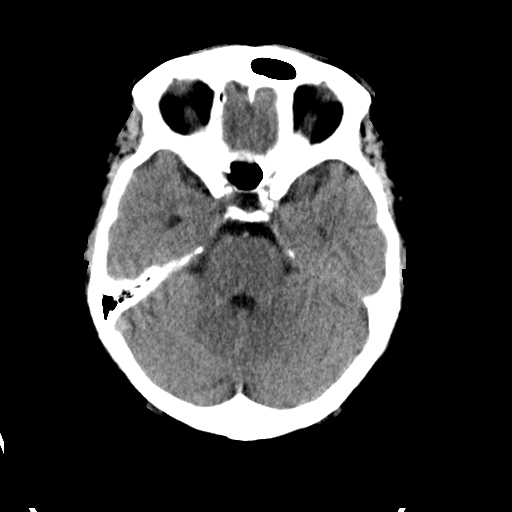
[im 12/31  brain]
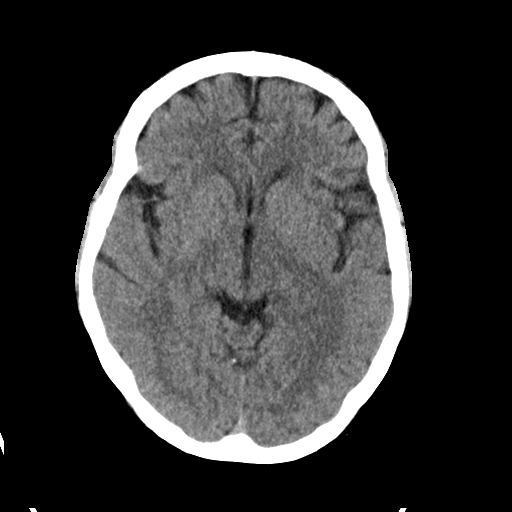
[im 16/31  brain]
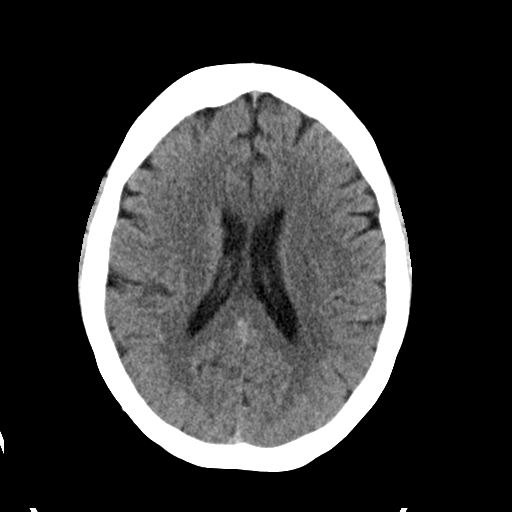
[im 19/31  brain]
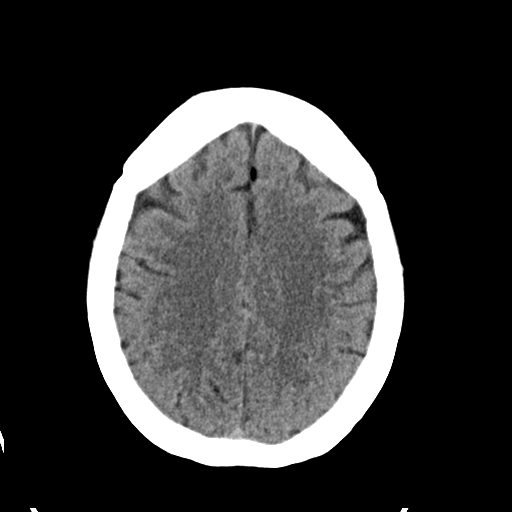
[im 19/31  bone]
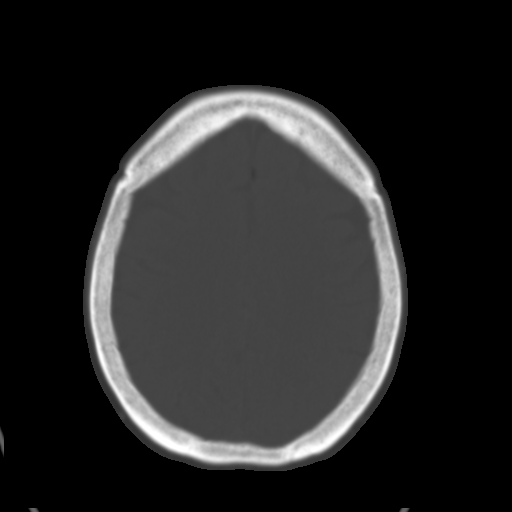
[im 23/31  brain]
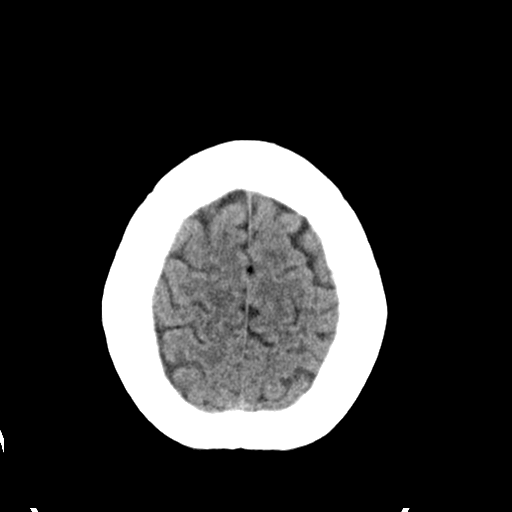
[im 27/31  brain]
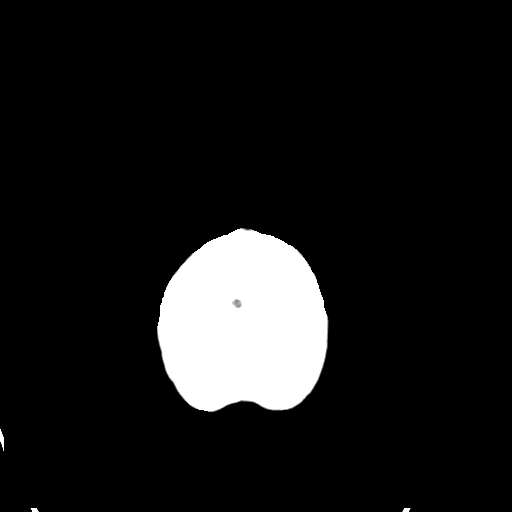

[Series 4: coronal soft · coronal · 0.27mm/px · 3 of 62 slices shown]
[im 23/62  brain]
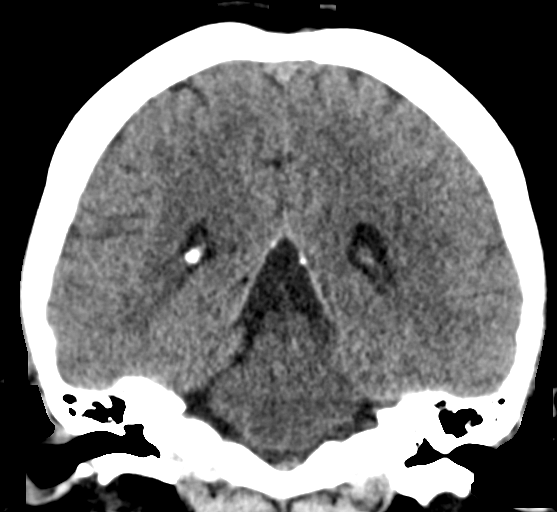
[im 28/62  brain]
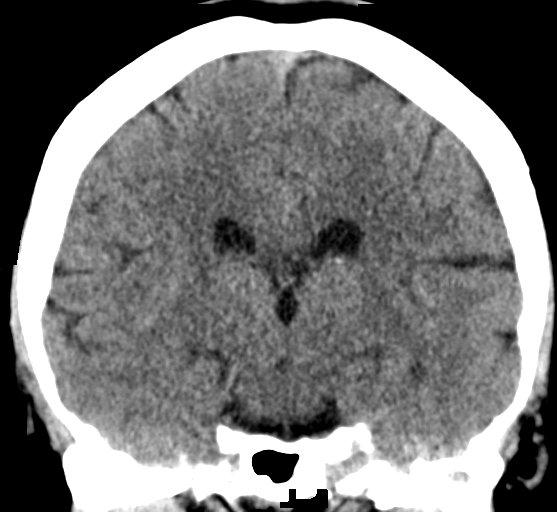
[im 33/62  brain]
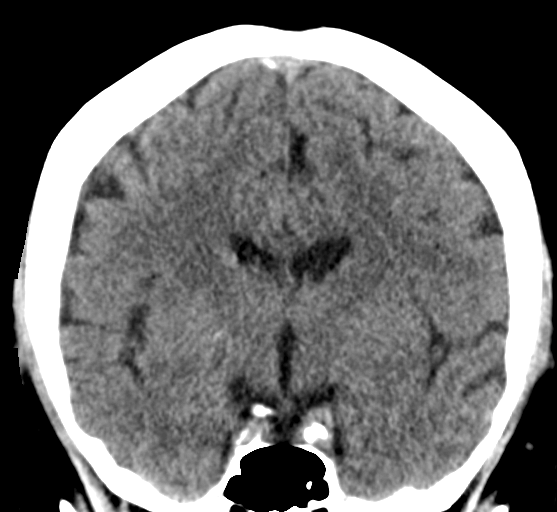

[18 of 40 positions shown; findings below may reference images not displayed]

FINDINGS: Brain: No evidence of acute infarction, hemorrhage, hydrocephalus,
extra-axial collection or mass lesion/mass effect.

Vascular: No hyperdense vessel or unexpected calcification.

Skull: Normal. Negative for fracture or focal lesion.

Sinuses/Orbits: No acute finding.

Other: None.
IMPRESSION: No acute intracranial pathology.

## 2021-11-24 ENCOUNTER — Other Ambulatory Visit: Payer: Self-pay

## 2021-11-24 ENCOUNTER — Other Ambulatory Visit: Payer: Self-pay | Admitting: Family Medicine

## 2021-11-24 DIAGNOSIS — K219 Gastro-esophageal reflux disease without esophagitis: Secondary | ICD-10-CM

## 2021-11-24 DIAGNOSIS — F419 Anxiety disorder, unspecified: Secondary | ICD-10-CM

## 2021-11-25 ENCOUNTER — Other Ambulatory Visit: Payer: Self-pay

## 2021-11-26 ENCOUNTER — Other Ambulatory Visit: Payer: Self-pay

## 2021-12-01 ENCOUNTER — Other Ambulatory Visit: Payer: Self-pay

## 2021-12-12 ENCOUNTER — Other Ambulatory Visit: Payer: Self-pay

## 2021-12-17 ENCOUNTER — Other Ambulatory Visit: Payer: Self-pay

## 2021-12-18 ENCOUNTER — Other Ambulatory Visit: Payer: Self-pay

## 2021-12-22 ENCOUNTER — Other Ambulatory Visit: Payer: Self-pay | Admitting: Family Medicine

## 2021-12-22 ENCOUNTER — Other Ambulatory Visit: Payer: Self-pay

## 2021-12-22 DIAGNOSIS — K219 Gastro-esophageal reflux disease without esophagitis: Secondary | ICD-10-CM

## 2021-12-22 DIAGNOSIS — F32A Depression, unspecified: Secondary | ICD-10-CM

## 2021-12-22 DIAGNOSIS — F419 Anxiety disorder, unspecified: Secondary | ICD-10-CM

## 2021-12-23 ENCOUNTER — Other Ambulatory Visit: Payer: Self-pay

## 2021-12-23 NOTE — Telephone Encounter (Signed)
Requested medication (s) are due for refill today: yes  Requested medication (s) are on the active medication list: yes  Last refill:  10/29/21  Future visit scheduled: no  Notes to clinic:  Unable to refill per protocol, courtesy refill already given, routing for provider approval.      Requested Prescriptions  Pending Prescriptions Disp Refills   escitalopram (LEXAPRO) 20 MG tablet 30 tablet 0    Sig: Take 1 tablet (20 mg total) by mouth daily, for future refills make office visit     Psychiatry:  Antidepressants - SSRI Passed - 12/22/2021 10:01 AM      Passed - Completed PHQ-2 or PHQ-9 in the last 360 days      Passed - Valid encounter within last 6 months    Recent Outpatient Visits           5 months ago Essential hypertension   [facility] [place], Shea Stakes, NP   6 months ago COVID-19   [facility], Gildardo Pounds, NP   10 months ago Type 2 diabetes mellitus with other circulatory complication, without long-term current use of insulin (HCC)   [facility] [place], [place], MD   1 year ago Type 2 diabetes mellitus with other circulatory complication, without long-term current use of insulin (HCC)   [facility] [place], Odette Horns, MD   1 year ago Annual physical exam   [facility] [place], Odette Horns, MD               pantoprazole (PROTONIX) 40 MG tablet 30 tablet 0    Sig: Take 1 tablet (40 mg total) by mouth daily.     Gastroenterology: Proton Pump Inhibitors Passed - 12/22/2021 10:01 AM      Passed - Valid encounter within last 12 months    Recent Outpatient Visits           5 months ago Essential hypertension   [facility] [place], Shea Stakes, NP   6 months ago COVID-19   [facility] [place], Gildardo Pounds, NP   10 months ago Type 2 diabetes mellitus with other circulatory complication,  without long-term current use of insulin (HCC)   [facility] [place], Odette Horns, MD   1 year ago Type 2 diabetes mellitus with other circulatory complication, without long-term current use of insulin (HCC)   [facility] Hoy Register, MD   1 year ago Annual physical exam   [facility] Hoy Register, MD

## 2021-12-25 ENCOUNTER — Other Ambulatory Visit: Payer: Self-pay

## 2021-12-25 MED ORDER — PANTOPRAZOLE SODIUM 40 MG PO TBEC
DELAYED_RELEASE_TABLET | ORAL | 1 refills | Status: DC
  Filled 2021-12-25: qty 90, 90d supply, fill #0
  Filled 2022-05-08: qty 90, 90d supply, fill #1

## 2021-12-25 MED ORDER — ESCITALOPRAM OXALATE 20 MG PO TABS
ORAL_TABLET | ORAL | 1 refills | Status: DC
  Filled 2021-12-25: qty 90, 90d supply, fill #0
  Filled 2022-05-08: qty 90, 90d supply, fill #1

## 2021-12-26 ENCOUNTER — Other Ambulatory Visit: Payer: Self-pay

## 2021-12-26 ENCOUNTER — Other Ambulatory Visit: Payer: Self-pay | Admitting: Physician Assistant

## 2021-12-26 DIAGNOSIS — E785 Hyperlipidemia, unspecified: Secondary | ICD-10-CM

## 2021-12-26 DIAGNOSIS — I10 Essential (primary) hypertension: Secondary | ICD-10-CM

## 2021-12-29 ENCOUNTER — Other Ambulatory Visit: Payer: Self-pay

## 2021-12-29 MED ORDER — ROSUVASTATIN CALCIUM 40 MG PO TABS
40.0000 mg | ORAL_TABLET | Freq: Every day | ORAL | 3 refills | Status: DC
  Filled 2021-12-29 – 2022-02-04 (×2): qty 90, 90d supply, fill #0
  Filled 2022-05-08: qty 90, 90d supply, fill #1
  Filled 2022-11-17: qty 90, 90d supply, fill #2

## 2021-12-29 MED ORDER — METOPROLOL SUCCINATE ER 25 MG PO TB24
25.0000 mg | ORAL_TABLET | Freq: Every day | ORAL | 3 refills | Status: DC
  Filled 2021-12-29 – 2022-01-01 (×2): qty 90, 90d supply, fill #0
  Filled 2022-05-08: qty 90, 90d supply, fill #1
  Filled 2022-09-20: qty 90, 90d supply, fill #2
  Filled 2022-12-21: qty 90, 90d supply, fill #3

## 2021-12-29 MED ORDER — LOSARTAN POTASSIUM 50 MG PO TABS
50.0000 mg | ORAL_TABLET | Freq: Every day | ORAL | 3 refills | Status: DC
  Filled 2021-12-29: qty 90, 90d supply, fill #0
  Filled 2022-05-08: qty 90, 90d supply, fill #1
  Filled 2022-07-22: qty 90, 90d supply, fill #2
  Filled 2022-11-17: qty 90, 90d supply, fill #3

## 2022-01-01 ENCOUNTER — Other Ambulatory Visit: Payer: Self-pay

## 2022-01-12 ENCOUNTER — Other Ambulatory Visit: Payer: Self-pay

## 2022-01-15 ENCOUNTER — Other Ambulatory Visit: Payer: Self-pay

## 2022-01-16 ENCOUNTER — Other Ambulatory Visit: Payer: Self-pay

## 2022-02-05 ENCOUNTER — Other Ambulatory Visit: Payer: Self-pay

## 2022-02-12 ENCOUNTER — Other Ambulatory Visit: Payer: Self-pay | Admitting: Family Medicine

## 2022-02-12 DIAGNOSIS — E1159 Type 2 diabetes mellitus with other circulatory complications: Secondary | ICD-10-CM

## 2022-02-17 ENCOUNTER — Other Ambulatory Visit: Payer: Self-pay

## 2022-03-02 ENCOUNTER — Other Ambulatory Visit: Payer: Self-pay

## 2022-03-02 ENCOUNTER — Other Ambulatory Visit: Payer: Self-pay | Admitting: Family Medicine

## 2022-03-02 DIAGNOSIS — E1159 Type 2 diabetes mellitus with other circulatory complications: Secondary | ICD-10-CM

## 2022-03-03 ENCOUNTER — Other Ambulatory Visit: Payer: Self-pay

## 2022-03-06 ENCOUNTER — Other Ambulatory Visit: Payer: Self-pay

## 2022-03-06 MED ORDER — TRULICITY 3 MG/0.5ML ~~LOC~~ SOAJ
3.0000 mg | SUBCUTANEOUS | 6 refills | Status: DC
  Filled 2022-03-06 (×2): qty 2, 28d supply, fill #0
  Filled 2022-07-22: qty 2, 28d supply, fill #1
  Filled 2022-08-17: qty 2, 28d supply, fill #2
  Filled 2022-09-20: qty 2, 28d supply, fill #3
  Filled 2022-10-20: qty 2, 28d supply, fill #4
  Filled 2022-11-17: qty 2, 28d supply, fill #5
  Filled 2022-12-14 – 2022-12-21 (×2): qty 2, 28d supply, fill #6

## 2022-03-18 ENCOUNTER — Other Ambulatory Visit: Payer: Self-pay

## 2022-03-19 ENCOUNTER — Other Ambulatory Visit: Payer: Self-pay

## 2022-03-31 DIAGNOSIS — E7800 Pure hypercholesterolemia, unspecified: Secondary | ICD-10-CM | POA: Diagnosis not present

## 2022-03-31 DIAGNOSIS — R739 Hyperglycemia, unspecified: Secondary | ICD-10-CM | POA: Diagnosis not present

## 2022-03-31 DIAGNOSIS — I10 Essential (primary) hypertension: Secondary | ICD-10-CM | POA: Diagnosis not present

## 2022-04-06 DIAGNOSIS — I10 Essential (primary) hypertension: Secondary | ICD-10-CM | POA: Diagnosis not present

## 2022-04-06 DIAGNOSIS — I2510 Atherosclerotic heart disease of native coronary artery without angina pectoris: Secondary | ICD-10-CM | POA: Diagnosis not present

## 2022-04-06 DIAGNOSIS — E782 Mixed hyperlipidemia: Secondary | ICD-10-CM | POA: Diagnosis not present

## 2022-04-06 DIAGNOSIS — E1159 Type 2 diabetes mellitus with other circulatory complications: Secondary | ICD-10-CM | POA: Diagnosis not present

## 2022-04-08 ENCOUNTER — Other Ambulatory Visit: Payer: Self-pay

## 2022-04-08 MED ORDER — ROSUVASTATIN CALCIUM 40 MG PO TABS
40.0000 mg | ORAL_TABLET | Freq: Every day | ORAL | 3 refills | Status: DC
  Filled 2022-04-08 – 2023-02-12 (×2): qty 90, 90d supply, fill #0

## 2022-04-08 MED ORDER — EZETIMIBE 10 MG PO TABS
10.0000 mg | ORAL_TABLET | Freq: Every day | ORAL | 3 refills | Status: DC
  Filled 2022-04-08 – 2022-09-20 (×2): qty 90, 90d supply, fill #0
  Filled 2022-12-21: qty 90, 90d supply, fill #1
  Filled 2023-04-02: qty 90, 90d supply, fill #2

## 2022-04-09 DIAGNOSIS — M170 Bilateral primary osteoarthritis of knee: Secondary | ICD-10-CM | POA: Diagnosis not present

## 2022-05-08 ENCOUNTER — Other Ambulatory Visit: Payer: Self-pay

## 2022-05-11 ENCOUNTER — Other Ambulatory Visit: Payer: Self-pay

## 2022-06-18 DIAGNOSIS — H524 Presbyopia: Secondary | ICD-10-CM | POA: Diagnosis not present

## 2022-06-18 DIAGNOSIS — H35033 Hypertensive retinopathy, bilateral: Secondary | ICD-10-CM | POA: Diagnosis not present

## 2022-06-18 DIAGNOSIS — H52223 Regular astigmatism, bilateral: Secondary | ICD-10-CM | POA: Diagnosis not present

## 2022-06-18 DIAGNOSIS — H25813 Combined forms of age-related cataract, bilateral: Secondary | ICD-10-CM | POA: Diagnosis not present

## 2022-06-18 DIAGNOSIS — H5203 Hypermetropia, bilateral: Secondary | ICD-10-CM | POA: Diagnosis not present

## 2022-06-18 DIAGNOSIS — E119 Type 2 diabetes mellitus without complications: Secondary | ICD-10-CM | POA: Diagnosis not present

## 2022-06-18 DIAGNOSIS — H04123 Dry eye syndrome of bilateral lacrimal glands: Secondary | ICD-10-CM | POA: Diagnosis not present

## 2022-07-14 ENCOUNTER — Other Ambulatory Visit: Payer: Self-pay

## 2022-07-14 DIAGNOSIS — Z0000 Encounter for general adult medical examination without abnormal findings: Secondary | ICD-10-CM | POA: Diagnosis not present

## 2022-07-14 DIAGNOSIS — E1159 Type 2 diabetes mellitus with other circulatory complications: Secondary | ICD-10-CM | POA: Diagnosis not present

## 2022-07-14 DIAGNOSIS — I700 Atherosclerosis of aorta: Secondary | ICD-10-CM | POA: Diagnosis not present

## 2022-07-14 DIAGNOSIS — E2839 Other primary ovarian failure: Secondary | ICD-10-CM | POA: Diagnosis not present

## 2022-07-14 DIAGNOSIS — I2510 Atherosclerotic heart disease of native coronary artery without angina pectoris: Secondary | ICD-10-CM | POA: Diagnosis not present

## 2022-07-14 MED ORDER — SCOPOLAMINE 1 MG/3DAYS TD PT72
1.0000 | MEDICATED_PATCH | Freq: Every day | TRANSDERMAL | 0 refills | Status: DC | PRN
  Filled 2022-07-14 – 2022-07-17 (×2): qty 3, 9d supply, fill #0

## 2022-07-15 ENCOUNTER — Other Ambulatory Visit: Payer: Self-pay

## 2022-07-15 ENCOUNTER — Telehealth: Payer: Self-pay | Admitting: Internal Medicine

## 2022-07-15 NOTE — Telephone Encounter (Signed)
Patient would like to switch from Dr. Debara Pickett to Dr. Johney Frame as the [facility] is closer to her place of employment.  Are you both in agreement with this?

## 2022-07-16 ENCOUNTER — Other Ambulatory Visit: Payer: Self-pay

## 2022-07-17 ENCOUNTER — Other Ambulatory Visit: Payer: Self-pay

## 2022-07-17 DIAGNOSIS — H16291 Other keratoconjunctivitis, right eye: Secondary | ICD-10-CM | POA: Diagnosis not present

## 2022-07-17 DIAGNOSIS — M170 Bilateral primary osteoarthritis of knee: Secondary | ICD-10-CM | POA: Diagnosis not present

## 2022-07-17 MED ORDER — TOBRAMYCIN-DEXAMETHASONE 0.3-0.1 % OP SUSP
1.0000 [drp] | Freq: Four times a day (QID) | OPHTHALMIC | 0 refills | Status: DC
  Filled 2022-07-17 (×2): qty 2.5, 13d supply, fill #0

## 2022-07-20 DIAGNOSIS — H5203 Hypermetropia, bilateral: Secondary | ICD-10-CM | POA: Diagnosis not present

## 2022-07-21 DIAGNOSIS — H16291 Other keratoconjunctivitis, right eye: Secondary | ICD-10-CM | POA: Diagnosis not present

## 2022-07-21 DIAGNOSIS — H04123 Dry eye syndrome of bilateral lacrimal glands: Secondary | ICD-10-CM | POA: Diagnosis not present

## 2022-07-21 DIAGNOSIS — H1045 Other chronic allergic conjunctivitis: Secondary | ICD-10-CM | POA: Diagnosis not present

## 2022-07-22 ENCOUNTER — Other Ambulatory Visit: Payer: Self-pay

## 2022-07-23 ENCOUNTER — Other Ambulatory Visit: Payer: Self-pay

## 2022-07-24 ENCOUNTER — Other Ambulatory Visit: Payer: Self-pay

## 2022-07-28 DIAGNOSIS — M85851 Other specified disorders of bone density and structure, right thigh: Secondary | ICD-10-CM | POA: Diagnosis not present

## 2022-07-28 DIAGNOSIS — Z1231 Encounter for screening mammogram for malignant neoplasm of breast: Secondary | ICD-10-CM | POA: Diagnosis not present

## 2022-07-28 DIAGNOSIS — Z780 Asymptomatic menopausal state: Secondary | ICD-10-CM | POA: Diagnosis not present

## 2022-07-28 DIAGNOSIS — M85852 Other specified disorders of bone density and structure, left thigh: Secondary | ICD-10-CM | POA: Diagnosis not present

## 2022-08-03 ENCOUNTER — Other Ambulatory Visit: Payer: Self-pay

## 2022-08-04 NOTE — Telephone Encounter (Signed)
Dr. Johney Frame is this okay with you?

## 2022-08-06 ENCOUNTER — Other Ambulatory Visit: Payer: Self-pay

## 2022-08-06 MED ORDER — ESCITALOPRAM OXALATE 20 MG PO TABS
20.0000 mg | ORAL_TABLET | Freq: Every day | ORAL | 1 refills | Status: DC
  Filled 2022-08-06: qty 90, 90d supply, fill #0
  Filled 2022-11-17: qty 90, 90d supply, fill #1

## 2022-08-10 ENCOUNTER — Other Ambulatory Visit: Payer: Self-pay

## 2022-08-13 ENCOUNTER — Other Ambulatory Visit: Payer: Self-pay

## 2022-08-17 ENCOUNTER — Other Ambulatory Visit: Payer: Self-pay

## 2022-09-14 DIAGNOSIS — K080 Exfoliation of teeth due to systemic causes: Secondary | ICD-10-CM | POA: Diagnosis not present

## 2022-09-20 ENCOUNTER — Other Ambulatory Visit: Payer: Self-pay

## 2022-09-21 ENCOUNTER — Other Ambulatory Visit: Payer: Self-pay

## 2022-09-22 ENCOUNTER — Other Ambulatory Visit: Payer: Self-pay

## 2022-09-22 MED ORDER — PANTOPRAZOLE SODIUM 40 MG PO TBEC
40.0000 mg | DELAYED_RELEASE_TABLET | Freq: Every day | ORAL | 0 refills | Status: DC
  Filled 2022-09-22: qty 90, 90d supply, fill #0

## 2022-10-08 DIAGNOSIS — E1159 Type 2 diabetes mellitus with other circulatory complications: Secondary | ICD-10-CM | POA: Diagnosis not present

## 2022-10-08 DIAGNOSIS — R635 Abnormal weight gain: Secondary | ICD-10-CM | POA: Diagnosis not present

## 2022-10-13 DIAGNOSIS — S39012A Strain of muscle, fascia and tendon of lower back, initial encounter: Secondary | ICD-10-CM | POA: Diagnosis not present

## 2022-10-13 DIAGNOSIS — E782 Mixed hyperlipidemia: Secondary | ICD-10-CM | POA: Diagnosis not present

## 2022-10-13 DIAGNOSIS — E119 Type 2 diabetes mellitus without complications: Secondary | ICD-10-CM | POA: Diagnosis not present

## 2022-10-13 DIAGNOSIS — Z6841 Body Mass Index (BMI) 40.0 and over, adult: Secondary | ICD-10-CM | POA: Diagnosis not present

## 2022-10-13 DIAGNOSIS — I10 Essential (primary) hypertension: Secondary | ICD-10-CM | POA: Diagnosis not present

## 2022-10-19 ENCOUNTER — Other Ambulatory Visit: Payer: Self-pay

## 2022-10-19 DIAGNOSIS — M545 Low back pain, unspecified: Secondary | ICD-10-CM | POA: Diagnosis not present

## 2022-10-19 DIAGNOSIS — M25551 Pain in right hip: Secondary | ICD-10-CM | POA: Diagnosis not present

## 2022-10-19 MED ORDER — METHOCARBAMOL 500 MG PO TABS
500.0000 mg | ORAL_TABLET | Freq: Every evening | ORAL | 0 refills | Status: DC | PRN
  Filled 2022-10-19: qty 15, 15d supply, fill #0

## 2022-10-20 ENCOUNTER — Other Ambulatory Visit: Payer: Self-pay

## 2022-10-21 ENCOUNTER — Other Ambulatory Visit: Payer: Self-pay

## 2022-10-22 ENCOUNTER — Other Ambulatory Visit: Payer: Self-pay

## 2022-10-26 DIAGNOSIS — M5416 Radiculopathy, lumbar region: Secondary | ICD-10-CM | POA: Diagnosis not present

## 2022-11-10 DIAGNOSIS — M5416 Radiculopathy, lumbar region: Secondary | ICD-10-CM | POA: Diagnosis not present

## 2022-11-17 ENCOUNTER — Other Ambulatory Visit: Payer: Self-pay

## 2022-11-17 DIAGNOSIS — M5416 Radiculopathy, lumbar region: Secondary | ICD-10-CM | POA: Diagnosis not present

## 2022-11-18 ENCOUNTER — Other Ambulatory Visit: Payer: Self-pay

## 2022-11-19 ENCOUNTER — Other Ambulatory Visit: Payer: Self-pay

## 2022-12-07 ENCOUNTER — Encounter: Payer: Self-pay | Admitting: Cardiovascular Disease

## 2022-12-07 ENCOUNTER — Ambulatory Visit: Payer: Medicare Other | Attending: Cardiovascular Disease | Admitting: Cardiovascular Disease

## 2022-12-07 VITALS — BP 128/74 | HR 69 | Ht <= 58 in | Wt 192.4 lb

## 2022-12-07 DIAGNOSIS — I255 Ischemic cardiomyopathy: Secondary | ICD-10-CM

## 2022-12-07 DIAGNOSIS — I2510 Atherosclerotic heart disease of native coronary artery without angina pectoris: Secondary | ICD-10-CM | POA: Diagnosis not present

## 2022-12-07 DIAGNOSIS — Z9861 Coronary angioplasty status: Secondary | ICD-10-CM | POA: Diagnosis not present

## 2022-12-07 DIAGNOSIS — E785 Hyperlipidemia, unspecified: Secondary | ICD-10-CM

## 2022-12-07 DIAGNOSIS — I10 Essential (primary) hypertension: Secondary | ICD-10-CM | POA: Diagnosis not present

## 2022-12-07 NOTE — Progress Notes (Signed)
Chief Complaint  Patient presents with   Follow-up    CAD   History of Present Illness: 67 yo female with history of anxiety, depression, CAD, ischemic cardiomyopathy, chronic systolic CHF, HTN, hyperlipidemia, DM who is here today for follow up. I am meeting her for the first time today. She has been followed by Dr. Rennis Golden. She had a stent placed in her LAD in June 2021. Echo October 2021 with LVEF=55-60%. No significant valve disease. Nuclear stress test in July 2022 with no ischemia.   She is here today for follow up. The patient denies any chest pain, dyspnea, palpitations, lower extremity edema, orthopnea, PND, dizziness, near syncope or syncope.   Primary Care Physician: [facility]   Past Medical History:  Diagnosis Date   Acute diverticulitis 05/17/2014   Anxiety    CAD (coronary artery disease)    Depression    Diverticulitis 05/17/2014   HTN (hypertension)    Hyperlipidemia LDL goal <70    Non-ST elevation (NSTEMI) myocardial infarction (HCC) 12/05/2019   Normocytic anemia 05/19/2014   NSTEMI (non-ST elevated myocardial infarction) (HCC)    12/05/19: DES-LAD   Sepsis (HCC) 05/19/2014   Type 2 diabetes mellitus with hyperosmolarity without coma, without long-term current use of insulin (HCC)    Unstable angina (HCC) 12/05/2019    Past Surgical History:  Procedure Laterality Date   BREAST SURGERY     CESAREAN SECTION     CORONARY STENT INTERVENTION N/A 12/05/2019   Procedure: CORONARY STENT INTERVENTION;  Surgeon: Kathleene Hazel, MD;  Location: [facility];  Service: Cardiovascular;  Laterality: N/A;   LEFT HEART CATH AND CORONARY ANGIOGRAPHY N/A 12/05/2019   Procedure: LEFT HEART CATH AND CORONARY ANGIOGRAPHY;  Surgeon: Kathleene Hazel, MD;  Location: [facility];  Service: Cardiovascular;  Laterality: N/A;   rotator cull surg     lt shoulder    Current Outpatient Medications  Medication Sig Dispense Refill    albuterol (VENTOLIN HFA) 108 (90 Base) MCG/ACT inhaler INHALE 2 PUFFS INTO THE LUNGS EVERY 6 (SIX) HOURS AS NEEDED FOR WHEEZING OR SHORTNESS OF BREATH. 8.5 g 1   aspirin EC 81 MG EC tablet Take 1 tablet (81 mg total) by mouth daily. Swallow whole. 30 tablet 11   Dulaglutide (TRULICITY) 3 MG/0.5ML SOPN Inject 3 mg as directed once a week. 2 mL 6   escitalopram (LEXAPRO) 20 MG tablet Take 1 tablet (20 mg total) by mouth daily. 90 tablet 1   ezetimibe (ZETIA) 10 MG tablet Take 1 tablet (10 mg total) by mouth daily. 90 tablet 3   fluticasone (FLONASE) 50 MCG/ACT nasal spray Place 1 spray into both nostrils daily.     gabapentin (NEURONTIN) 300 MG capsule Take 300 mg by mouth 3 (three) times daily.     loratadine (CLARITIN) 10 MG tablet Take 10 mg by mouth daily.     losartan (COZAAR) 50 MG tablet Take 1 tablet (50 mg total) by mouth daily. Please fill as a 90 day supply 90 tablet 3   metoprolol succinate (TOPROL-XL) 25 MG 24 hr tablet Take 1 tablet (25 mg total) by mouth daily. Please fill as a 90 day supply 90 tablet 3   pantoprazole (PROTONIX) 40 MG tablet Take 1 tablet (40 mg total) by mouth daily. 90 tablet 0   rosuvastatin (CRESTOR) 40 MG tablet Take 1 tablet (40 mg total) by mouth daily. Please fill as a 90 day supply 90 tablet 3  rosuvastatin (CRESTOR) 40 MG tablet Take 1 tablet (40 mg total) by mouth daily. 90 tablet 3   nitroGLYCERIN (NITROSTAT) 0.4 MG SL tablet Place 1 tablet (0.4 mg total) under the tongue every 5 (five) minutes as needed for chest pain. 25 tablet 3   saccharomyces boulardii (FLORASTOR) 250 MG capsule 1 capsule Orally twice a day 90 days (Patient not taking: Reported on 12/07/2022) 180 capsule 4   No current facility-administered medications for this visit.    Allergies  Allergen Reactions   Sulfa Antibiotics Rash    Social History   Socioeconomic History   Marital status: Legally Separated    Spouse name: Not on file   Number of children: Not on file   Years of  education: Not on file   Highest education level: Not on file  Occupational History   Not on file  Tobacco Use   Smoking status: Never   Smokeless tobacco: Never  Substance and Sexual Activity   Alcohol use: Yes    Comment: occasional   Drug use: No   Sexual activity: Yes  Other Topics Concern   Not on file  Social History Narrative   Not on file   Social Determinants of Health   Financial Resource Strain: Not on file  Food Insecurity: Not on file  Transportation Needs: Not on file  Physical Activity: Not on file  Stress: Not on file  Social Connections: Not on file  Intimate Partner Violence: Not on file    Family History  Problem Relation Age of Onset   Heart attack Father    Hypertension Sister    Heart attack Brother    Diabetes Sister    Colon cancer Neg Hx     Review of Systems:  As stated in the HPI and otherwise negative.   BP 128/74   Pulse 69   Ht 4\' 10"  (1.473 m)   Wt 87.3 kg   SpO2 99%   BMI 40.21 kg/m   Physical Examination: General: Well developed, well nourished, NAD  HEENT: OP clear, mucus membranes moist  SKIN: warm, dry. No rashes. Neuro: No focal deficits  Musculoskeletal: Muscle strength 5/5 all ext  Psychiatric: Mood and affect normal  Neck: No JVD, no carotid bruits, no thyromegaly, no lymphadenopathy.  Lungs:Clear bilaterally, no wheezes, rhonci, crackles Cardiovascular: Regular rate and rhythm. No murmurs, gallops or rubs. Abdomen:Soft. Bowel sounds present. Non-tender.  Extremities: No lower extremity edema. Pulses are 2 + in the bilateral DP/PT.  EKG:  EKG is ordered today. The ekg ordered today demonstrates  EKG Interpretation  Date/Time:  Monday December 07 2022 16:13:54 EDT Ventricular Rate:  69 PR Interval:  158 QRS Duration: 90 QT Interval:  420 QTC Calculation: 450 R Axis:   0 Text Interpretation: Normal sinus rhythm Normal ECG When compared with ECG of 19-Sep-2020 20:04, PREVIOUS ECG IS PRESENT Confirmed by Verne Carrow [phone]) on 12/07/2022 4:20:03 PM    Echo 03/18/20:  1. Left ventricular ejection fraction, by estimation, is 55 to 60%. Left  ventricular ejection fraction by PLAX is 58 %. The left ventricle has  normal function. The left ventricle has no regional wall motion  abnormalities. Left ventricular diastolic  parameters are consistent with Grade II diastolic dysfunction  (pseudonormalization). Elevated left ventricular end-diastolic pressure.   2. Right ventricular systolic function is normal. The right ventricular  size is normal. There is normal pulmonary artery systolic pressure.   3. The mitral valve is normal in structure. Trivial mitral valve  regurgitation. No evidence of mitral stenosis.   4. The aortic valve is tricuspid. Aortic valve regurgitation is not  visualized. No aortic stenosis is present.   5. Aortic dilatation noted. There is mild dilatation of the ascending  aorta, measuring 41 mm.   6. The inferior vena cava is normal in size with greater than 50%  respiratory variability, suggesting right atrial pressure of 3 mmHg.   Recent Labs: No results found for requested labs within last 365 days.   Lipid Panel    Component Value Date/Time   CHOL 138 07/16/2021 0906   TRIG 158 (H) 07/16/2021 0906   HDL 40 07/16/2021 0906   CHOLHDL 3.5 07/16/2021 0906   CHOLHDL 6.2 12/06/2019 0310   VLDL 64 (H) 12/06/2019 0310   LDLCALC 71 07/16/2021 0906     Wt Readings from Last 3 Encounters:  12/07/22 87.3 kg  10/15/21 90.6 kg  02/05/21 90.5 kg    Assessment and Plan:   1. CAD without angina: She has no chest pain suggestive of angina. Continue ASA, statin, Toprol.   2. Ischemic cardiomyopathy: LV function normal by echo in 2021. Will continue Toprol and Losartan  3. HTN: BP is well controlled. No changes today  4. Hyperlipidemia: LDL at goal in April 2024. Continue statin and Zetia.   Labs/ tests ordered today include:   Orders Placed This Encounter  Procedures    EKG 12-Lead   Disposition:   F/U with me in one year   Signed, Verne Carrow, MD, [facility] 12/07/2022 4:39 PM    [facility] [address] [place], [place], [place]  [address] Phone: [phone]; Fax: [phone]

## 2022-12-07 NOTE — Patient Instructions (Signed)
Medication Instructions:  No changes *If you need a refill on your cardiac medications before your next appointment, please call your pharmacy*   Lab Work: none If you have labs (blood work) drawn today and your tests are completely normal, you will receive your results only by: MyChart Message (if you have MyChart) OR A paper copy in the mail If you have any lab test that is abnormal or we need to change your treatment, we will call you to review the results.   Testing/Procedures: none   Follow-Up: At [facility], you and your health needs are our priority.  As part of our continuing mission to provide you with exceptional heart care, we have created designated Provider Care Teams.  These Care Teams include your primary Cardiologist (physician) and Advanced Practice Providers (APPs -  Physician Assistants and Nurse Practitioners) who all work together to provide you with the care you need, when you need it.   Your next appointment:   12 month(s)  Provider:   Christopher McAlhany, MD      

## 2022-12-08 ENCOUNTER — Other Ambulatory Visit: Payer: Self-pay

## 2022-12-08 DIAGNOSIS — M5416 Radiculopathy, lumbar region: Secondary | ICD-10-CM | POA: Diagnosis not present

## 2022-12-08 DIAGNOSIS — Z6841 Body Mass Index (BMI) 40.0 and over, adult: Secondary | ICD-10-CM | POA: Diagnosis not present

## 2022-12-08 DIAGNOSIS — M5451 Vertebrogenic low back pain: Secondary | ICD-10-CM | POA: Diagnosis not present

## 2022-12-08 MED ORDER — GABAPENTIN 300 MG PO CAPS
300.0000 mg | ORAL_CAPSULE | Freq: Three times a day (TID) | ORAL | 0 refills | Status: DC
  Filled 2022-12-08: qty 90, 30d supply, fill #0

## 2022-12-09 DIAGNOSIS — M79604 Pain in right leg: Secondary | ICD-10-CM | POA: Diagnosis not present

## 2022-12-09 DIAGNOSIS — M545 Low back pain, unspecified: Secondary | ICD-10-CM | POA: Diagnosis not present

## 2022-12-09 DIAGNOSIS — M6281 Muscle weakness (generalized): Secondary | ICD-10-CM | POA: Diagnosis not present

## 2022-12-09 DIAGNOSIS — R202 Paresthesia of skin: Secondary | ICD-10-CM | POA: Diagnosis not present

## 2022-12-14 ENCOUNTER — Other Ambulatory Visit: Payer: Self-pay

## 2022-12-14 DIAGNOSIS — M79604 Pain in right leg: Secondary | ICD-10-CM | POA: Diagnosis not present

## 2022-12-14 DIAGNOSIS — M6281 Muscle weakness (generalized): Secondary | ICD-10-CM | POA: Diagnosis not present

## 2022-12-14 DIAGNOSIS — R202 Paresthesia of skin: Secondary | ICD-10-CM | POA: Diagnosis not present

## 2022-12-14 DIAGNOSIS — M545 Low back pain, unspecified: Secondary | ICD-10-CM | POA: Diagnosis not present

## 2022-12-15 ENCOUNTER — Other Ambulatory Visit (HOSPITAL_COMMUNITY): Payer: Self-pay

## 2022-12-16 ENCOUNTER — Other Ambulatory Visit (HOSPITAL_COMMUNITY): Payer: Self-pay

## 2022-12-16 MED ORDER — PANTOPRAZOLE SODIUM 40 MG PO TBEC
40.0000 mg | DELAYED_RELEASE_TABLET | Freq: Every day | ORAL | 0 refills | Status: DC
  Filled 2022-12-16 – 2022-12-21 (×2): qty 90, 90d supply, fill #0

## 2022-12-21 ENCOUNTER — Other Ambulatory Visit: Payer: Self-pay

## 2022-12-21 DIAGNOSIS — M545 Low back pain, unspecified: Secondary | ICD-10-CM | POA: Diagnosis not present

## 2022-12-21 DIAGNOSIS — R202 Paresthesia of skin: Secondary | ICD-10-CM | POA: Diagnosis not present

## 2022-12-21 DIAGNOSIS — M79604 Pain in right leg: Secondary | ICD-10-CM | POA: Diagnosis not present

## 2022-12-21 DIAGNOSIS — M6281 Muscle weakness (generalized): Secondary | ICD-10-CM | POA: Diagnosis not present

## 2022-12-22 ENCOUNTER — Other Ambulatory Visit (HOSPITAL_COMMUNITY): Payer: Self-pay

## 2022-12-23 DIAGNOSIS — M545 Low back pain, unspecified: Secondary | ICD-10-CM | POA: Diagnosis not present

## 2022-12-23 DIAGNOSIS — M79604 Pain in right leg: Secondary | ICD-10-CM | POA: Diagnosis not present

## 2022-12-23 DIAGNOSIS — R202 Paresthesia of skin: Secondary | ICD-10-CM | POA: Diagnosis not present

## 2022-12-23 DIAGNOSIS — M6281 Muscle weakness (generalized): Secondary | ICD-10-CM | POA: Diagnosis not present

## 2022-12-28 DIAGNOSIS — M79604 Pain in right leg: Secondary | ICD-10-CM | POA: Diagnosis not present

## 2022-12-28 DIAGNOSIS — M545 Low back pain, unspecified: Secondary | ICD-10-CM | POA: Diagnosis not present

## 2022-12-28 DIAGNOSIS — M6281 Muscle weakness (generalized): Secondary | ICD-10-CM | POA: Diagnosis not present

## 2022-12-28 DIAGNOSIS — R202 Paresthesia of skin: Secondary | ICD-10-CM | POA: Diagnosis not present

## 2022-12-30 DIAGNOSIS — M6281 Muscle weakness (generalized): Secondary | ICD-10-CM | POA: Diagnosis not present

## 2022-12-30 DIAGNOSIS — R202 Paresthesia of skin: Secondary | ICD-10-CM | POA: Diagnosis not present

## 2022-12-30 DIAGNOSIS — M79604 Pain in right leg: Secondary | ICD-10-CM | POA: Diagnosis not present

## 2022-12-30 DIAGNOSIS — M545 Low back pain, unspecified: Secondary | ICD-10-CM | POA: Diagnosis not present

## 2023-01-19 DIAGNOSIS — K080 Exfoliation of teeth due to systemic causes: Secondary | ICD-10-CM | POA: Diagnosis not present

## 2023-01-20 ENCOUNTER — Other Ambulatory Visit: Payer: Self-pay

## 2023-01-21 ENCOUNTER — Other Ambulatory Visit: Payer: Self-pay

## 2023-01-21 MED ORDER — TRULICITY 3 MG/0.5ML ~~LOC~~ SOAJ
3.0000 mg | SUBCUTANEOUS | 6 refills | Status: DC
  Filled 2023-01-21: qty 2, 28d supply, fill #0

## 2023-01-25 DIAGNOSIS — K080 Exfoliation of teeth due to systemic causes: Secondary | ICD-10-CM | POA: Diagnosis not present

## 2023-02-01 ENCOUNTER — Other Ambulatory Visit: Payer: Self-pay

## 2023-02-01 DIAGNOSIS — Z1331 Encounter for screening for depression: Secondary | ICD-10-CM | POA: Diagnosis not present

## 2023-02-01 DIAGNOSIS — I10 Essential (primary) hypertension: Secondary | ICD-10-CM | POA: Diagnosis not present

## 2023-02-01 DIAGNOSIS — E782 Mixed hyperlipidemia: Secondary | ICD-10-CM | POA: Diagnosis not present

## 2023-02-01 DIAGNOSIS — I2510 Atherosclerotic heart disease of native coronary artery without angina pectoris: Secondary | ICD-10-CM | POA: Diagnosis not present

## 2023-02-01 DIAGNOSIS — G4730 Sleep apnea, unspecified: Secondary | ICD-10-CM | POA: Diagnosis not present

## 2023-02-01 DIAGNOSIS — Z6841 Body Mass Index (BMI) 40.0 and over, adult: Secondary | ICD-10-CM | POA: Diagnosis not present

## 2023-02-01 DIAGNOSIS — E559 Vitamin D deficiency, unspecified: Secondary | ICD-10-CM | POA: Diagnosis not present

## 2023-02-01 DIAGNOSIS — E1159 Type 2 diabetes mellitus with other circulatory complications: Secondary | ICD-10-CM | POA: Diagnosis not present

## 2023-02-01 MED ORDER — OZEMPIC (2 MG/DOSE) 8 MG/3ML ~~LOC~~ SOPN
2.0000 mg | PEN_INJECTOR | SUBCUTANEOUS | 1 refills | Status: AC
  Filled 2023-02-01 – 2023-02-03 (×2): qty 3, 28d supply, fill #0

## 2023-02-03 ENCOUNTER — Other Ambulatory Visit (HOSPITAL_BASED_OUTPATIENT_CLINIC_OR_DEPARTMENT_OTHER): Payer: Self-pay

## 2023-02-03 ENCOUNTER — Other Ambulatory Visit: Payer: Self-pay

## 2023-02-03 ENCOUNTER — Other Ambulatory Visit (HOSPITAL_COMMUNITY): Payer: Self-pay

## 2023-02-11 ENCOUNTER — Other Ambulatory Visit: Payer: Self-pay

## 2023-02-12 ENCOUNTER — Other Ambulatory Visit: Payer: Self-pay

## 2023-02-12 ENCOUNTER — Other Ambulatory Visit: Payer: Self-pay | Admitting: Physician Assistant

## 2023-02-12 ENCOUNTER — Other Ambulatory Visit (HOSPITAL_COMMUNITY): Payer: Self-pay

## 2023-02-12 DIAGNOSIS — I10 Essential (primary) hypertension: Secondary | ICD-10-CM

## 2023-02-12 MED ORDER — LOSARTAN POTASSIUM 50 MG PO TABS
50.0000 mg | ORAL_TABLET | Freq: Every day | ORAL | 2 refills | Status: DC
  Filled 2023-02-12 – 2023-02-16 (×2): qty 90, 90d supply, fill #0
  Filled 2023-07-02: qty 90, 90d supply, fill #1
  Filled 2023-09-29: qty 90, 90d supply, fill #2

## 2023-02-13 ENCOUNTER — Other Ambulatory Visit (HOSPITAL_BASED_OUTPATIENT_CLINIC_OR_DEPARTMENT_OTHER): Payer: Self-pay

## 2023-02-16 ENCOUNTER — Other Ambulatory Visit (HOSPITAL_COMMUNITY): Payer: Self-pay

## 2023-02-16 ENCOUNTER — Other Ambulatory Visit: Payer: Self-pay

## 2023-02-16 DIAGNOSIS — R0683 Snoring: Secondary | ICD-10-CM | POA: Diagnosis not present

## 2023-02-16 MED ORDER — ESCITALOPRAM OXALATE 20 MG PO TABS
20.0000 mg | ORAL_TABLET | Freq: Every day | ORAL | 1 refills | Status: DC
  Filled 2023-02-16 (×2): qty 90, 90d supply, fill #0
  Filled 2023-07-02: qty 90, 90d supply, fill #1

## 2023-02-17 ENCOUNTER — Other Ambulatory Visit: Payer: Self-pay

## 2023-02-17 DIAGNOSIS — E782 Mixed hyperlipidemia: Secondary | ICD-10-CM | POA: Diagnosis not present

## 2023-02-17 DIAGNOSIS — E559 Vitamin D deficiency, unspecified: Secondary | ICD-10-CM | POA: Diagnosis not present

## 2023-02-17 DIAGNOSIS — E1159 Type 2 diabetes mellitus with other circulatory complications: Secondary | ICD-10-CM | POA: Diagnosis not present

## 2023-02-17 DIAGNOSIS — I10 Essential (primary) hypertension: Secondary | ICD-10-CM | POA: Diagnosis not present

## 2023-02-17 DIAGNOSIS — Z6841 Body Mass Index (BMI) 40.0 and over, adult: Secondary | ICD-10-CM | POA: Diagnosis not present

## 2023-03-10 DIAGNOSIS — G4733 Obstructive sleep apnea (adult) (pediatric): Secondary | ICD-10-CM | POA: Diagnosis not present

## 2023-03-11 DIAGNOSIS — M170 Bilateral primary osteoarthritis of knee: Secondary | ICD-10-CM | POA: Diagnosis not present

## 2023-03-16 DIAGNOSIS — E119 Type 2 diabetes mellitus without complications: Secondary | ICD-10-CM | POA: Diagnosis not present

## 2023-03-17 DIAGNOSIS — I10 Essential (primary) hypertension: Secondary | ICD-10-CM | POA: Diagnosis not present

## 2023-03-17 DIAGNOSIS — I2510 Atherosclerotic heart disease of native coronary artery without angina pectoris: Secondary | ICD-10-CM | POA: Diagnosis not present

## 2023-03-17 DIAGNOSIS — Z6841 Body Mass Index (BMI) 40.0 and over, adult: Secondary | ICD-10-CM | POA: Diagnosis not present

## 2023-03-17 DIAGNOSIS — E782 Mixed hyperlipidemia: Secondary | ICD-10-CM | POA: Diagnosis not present

## 2023-03-17 DIAGNOSIS — E1159 Type 2 diabetes mellitus with other circulatory complications: Secondary | ICD-10-CM | POA: Diagnosis not present

## 2023-04-01 DIAGNOSIS — G4733 Obstructive sleep apnea (adult) (pediatric): Secondary | ICD-10-CM | POA: Diagnosis not present

## 2023-04-02 ENCOUNTER — Other Ambulatory Visit (HOSPITAL_COMMUNITY): Payer: Self-pay

## 2023-04-15 DIAGNOSIS — E782 Mixed hyperlipidemia: Secondary | ICD-10-CM | POA: Diagnosis not present

## 2023-04-15 DIAGNOSIS — I10 Essential (primary) hypertension: Secondary | ICD-10-CM | POA: Diagnosis not present

## 2023-04-15 DIAGNOSIS — Z6841 Body Mass Index (BMI) 40.0 and over, adult: Secondary | ICD-10-CM | POA: Diagnosis not present

## 2023-04-15 DIAGNOSIS — I2510 Atherosclerotic heart disease of native coronary artery without angina pectoris: Secondary | ICD-10-CM | POA: Diagnosis not present

## 2023-04-15 DIAGNOSIS — E1159 Type 2 diabetes mellitus with other circulatory complications: Secondary | ICD-10-CM | POA: Diagnosis not present

## 2023-04-19 ENCOUNTER — Other Ambulatory Visit: Payer: Self-pay | Admitting: Physician Assistant

## 2023-04-19 DIAGNOSIS — I10 Essential (primary) hypertension: Secondary | ICD-10-CM

## 2023-04-20 ENCOUNTER — Other Ambulatory Visit (HOSPITAL_COMMUNITY): Payer: Self-pay

## 2023-04-20 MED ORDER — METOPROLOL SUCCINATE ER 25 MG PO TB24
25.0000 mg | ORAL_TABLET | Freq: Every day | ORAL | 0 refills | Status: DC
  Filled 2023-04-20 (×2): qty 90, 90d supply, fill #0

## 2023-05-02 DIAGNOSIS — G4733 Obstructive sleep apnea (adult) (pediatric): Secondary | ICD-10-CM | POA: Diagnosis not present

## 2023-05-11 ENCOUNTER — Other Ambulatory Visit (HOSPITAL_COMMUNITY): Payer: Self-pay

## 2023-05-11 DIAGNOSIS — E782 Mixed hyperlipidemia: Secondary | ICD-10-CM | POA: Diagnosis not present

## 2023-05-11 DIAGNOSIS — I10 Essential (primary) hypertension: Secondary | ICD-10-CM | POA: Diagnosis not present

## 2023-05-11 DIAGNOSIS — E1159 Type 2 diabetes mellitus with other circulatory complications: Secondary | ICD-10-CM | POA: Diagnosis not present

## 2023-05-11 DIAGNOSIS — I2510 Atherosclerotic heart disease of native coronary artery without angina pectoris: Secondary | ICD-10-CM | POA: Diagnosis not present

## 2023-05-11 DIAGNOSIS — E66812 Obesity, class 2: Secondary | ICD-10-CM | POA: Diagnosis not present

## 2023-05-11 DIAGNOSIS — Z6839 Body mass index (BMI) 39.0-39.9, adult: Secondary | ICD-10-CM | POA: Diagnosis not present

## 2023-05-12 ENCOUNTER — Other Ambulatory Visit (HOSPITAL_COMMUNITY): Payer: Self-pay

## 2023-05-12 MED ORDER — PANTOPRAZOLE SODIUM 40 MG PO TBEC
40.0000 mg | DELAYED_RELEASE_TABLET | Freq: Every day | ORAL | 0 refills | Status: DC
  Filled 2023-05-12 – 2023-05-17 (×2): qty 90, 90d supply, fill #0

## 2023-05-12 MED ORDER — ROSUVASTATIN CALCIUM 40 MG PO TABS
40.0000 mg | ORAL_TABLET | Freq: Every day | ORAL | 0 refills | Status: DC
  Filled 2023-05-12 – 2023-05-17 (×2): qty 90, 90d supply, fill #0

## 2023-05-17 ENCOUNTER — Other Ambulatory Visit: Payer: Self-pay

## 2023-05-17 ENCOUNTER — Other Ambulatory Visit (HOSPITAL_COMMUNITY): Payer: Self-pay

## 2023-05-18 ENCOUNTER — Other Ambulatory Visit (HOSPITAL_COMMUNITY): Payer: Self-pay

## 2023-05-18 DIAGNOSIS — M5416 Radiculopathy, lumbar region: Secondary | ICD-10-CM | POA: Diagnosis not present

## 2023-05-21 ENCOUNTER — Other Ambulatory Visit (HOSPITAL_COMMUNITY): Payer: Self-pay

## 2023-06-01 DIAGNOSIS — G4733 Obstructive sleep apnea (adult) (pediatric): Secondary | ICD-10-CM | POA: Diagnosis not present

## 2023-06-01 DIAGNOSIS — R300 Dysuria: Secondary | ICD-10-CM | POA: Diagnosis not present

## 2023-06-08 DIAGNOSIS — M25512 Pain in left shoulder: Secondary | ICD-10-CM | POA: Diagnosis not present

## 2023-06-17 DIAGNOSIS — G4733 Obstructive sleep apnea (adult) (pediatric): Secondary | ICD-10-CM | POA: Diagnosis not present

## 2023-06-22 DIAGNOSIS — H40053 Ocular hypertension, bilateral: Secondary | ICD-10-CM | POA: Diagnosis not present

## 2023-06-22 DIAGNOSIS — H524 Presbyopia: Secondary | ICD-10-CM | POA: Diagnosis not present

## 2023-06-22 DIAGNOSIS — H35033 Hypertensive retinopathy, bilateral: Secondary | ICD-10-CM | POA: Diagnosis not present

## 2023-06-22 DIAGNOSIS — H25813 Combined forms of age-related cataract, bilateral: Secondary | ICD-10-CM | POA: Diagnosis not present

## 2023-06-22 DIAGNOSIS — H52223 Regular astigmatism, bilateral: Secondary | ICD-10-CM | POA: Diagnosis not present

## 2023-06-22 DIAGNOSIS — H04123 Dry eye syndrome of bilateral lacrimal glands: Secondary | ICD-10-CM | POA: Diagnosis not present

## 2023-06-22 DIAGNOSIS — H5203 Hypermetropia, bilateral: Secondary | ICD-10-CM | POA: Diagnosis not present

## 2023-06-24 DIAGNOSIS — E1159 Type 2 diabetes mellitus with other circulatory complications: Secondary | ICD-10-CM | POA: Diagnosis not present

## 2023-06-24 DIAGNOSIS — E782 Mixed hyperlipidemia: Secondary | ICD-10-CM | POA: Diagnosis not present

## 2023-06-24 DIAGNOSIS — I10 Essential (primary) hypertension: Secondary | ICD-10-CM | POA: Diagnosis not present

## 2023-06-24 DIAGNOSIS — E66812 Obesity, class 2: Secondary | ICD-10-CM | POA: Diagnosis not present

## 2023-06-24 DIAGNOSIS — Z6838 Body mass index (BMI) 38.0-38.9, adult: Secondary | ICD-10-CM | POA: Diagnosis not present

## 2023-06-24 DIAGNOSIS — I2510 Atherosclerotic heart disease of native coronary artery without angina pectoris: Secondary | ICD-10-CM | POA: Diagnosis not present

## 2023-06-30 DIAGNOSIS — G4733 Obstructive sleep apnea (adult) (pediatric): Secondary | ICD-10-CM | POA: Diagnosis not present

## 2023-07-02 ENCOUNTER — Other Ambulatory Visit (HOSPITAL_COMMUNITY): Payer: Self-pay

## 2023-07-02 DIAGNOSIS — G4733 Obstructive sleep apnea (adult) (pediatric): Secondary | ICD-10-CM | POA: Diagnosis not present

## 2023-07-03 ENCOUNTER — Other Ambulatory Visit (HOSPITAL_COMMUNITY): Payer: Self-pay

## 2023-07-06 ENCOUNTER — Other Ambulatory Visit (HOSPITAL_COMMUNITY): Payer: Self-pay

## 2023-07-06 MED ORDER — EZETIMIBE 10 MG PO TABS
10.0000 mg | ORAL_TABLET | Freq: Every day | ORAL | 0 refills | Status: DC
  Filled 2023-07-06: qty 90, 90d supply, fill #0

## 2023-07-12 ENCOUNTER — Other Ambulatory Visit: Payer: Self-pay | Admitting: Physician Assistant

## 2023-07-12 DIAGNOSIS — I10 Essential (primary) hypertension: Secondary | ICD-10-CM

## 2023-07-13 ENCOUNTER — Other Ambulatory Visit (HOSPITAL_COMMUNITY): Payer: Self-pay

## 2023-07-13 ENCOUNTER — Other Ambulatory Visit: Payer: Self-pay

## 2023-07-13 MED ORDER — METOPROLOL SUCCINATE ER 25 MG PO TB24
25.0000 mg | ORAL_TABLET | Freq: Every day | ORAL | 1 refills | Status: DC
  Filled 2023-07-13: qty 90, 90d supply, fill #0
  Filled 2023-12-01: qty 90, 90d supply, fill #1

## 2023-07-14 DIAGNOSIS — E7800 Pure hypercholesterolemia, unspecified: Secondary | ICD-10-CM | POA: Diagnosis not present

## 2023-07-14 DIAGNOSIS — E1159 Type 2 diabetes mellitus with other circulatory complications: Secondary | ICD-10-CM | POA: Diagnosis not present

## 2023-07-20 DIAGNOSIS — Z0000 Encounter for general adult medical examination without abnormal findings: Secondary | ICD-10-CM | POA: Diagnosis not present

## 2023-07-20 DIAGNOSIS — E118 Type 2 diabetes mellitus with unspecified complications: Secondary | ICD-10-CM | POA: Diagnosis not present

## 2023-07-20 DIAGNOSIS — K219 Gastro-esophageal reflux disease without esophagitis: Secondary | ICD-10-CM | POA: Diagnosis not present

## 2023-07-20 DIAGNOSIS — F325 Major depressive disorder, single episode, in full remission: Secondary | ICD-10-CM | POA: Diagnosis not present

## 2023-07-20 DIAGNOSIS — I10 Essential (primary) hypertension: Secondary | ICD-10-CM | POA: Diagnosis not present

## 2023-07-29 DIAGNOSIS — E782 Mixed hyperlipidemia: Secondary | ICD-10-CM | POA: Diagnosis not present

## 2023-07-29 DIAGNOSIS — Z6838 Body mass index (BMI) 38.0-38.9, adult: Secondary | ICD-10-CM | POA: Diagnosis not present

## 2023-07-29 DIAGNOSIS — E66812 Obesity, class 2: Secondary | ICD-10-CM | POA: Diagnosis not present

## 2023-07-29 DIAGNOSIS — I2510 Atherosclerotic heart disease of native coronary artery without angina pectoris: Secondary | ICD-10-CM | POA: Diagnosis not present

## 2023-07-29 DIAGNOSIS — E1159 Type 2 diabetes mellitus with other circulatory complications: Secondary | ICD-10-CM | POA: Diagnosis not present

## 2023-07-29 DIAGNOSIS — I10 Essential (primary) hypertension: Secondary | ICD-10-CM | POA: Diagnosis not present

## 2023-08-03 DIAGNOSIS — Z1231 Encounter for screening mammogram for malignant neoplasm of breast: Secondary | ICD-10-CM | POA: Diagnosis not present

## 2023-09-06 DIAGNOSIS — E559 Vitamin D deficiency, unspecified: Secondary | ICD-10-CM | POA: Diagnosis not present

## 2023-09-06 DIAGNOSIS — I10 Essential (primary) hypertension: Secondary | ICD-10-CM | POA: Diagnosis not present

## 2023-09-06 DIAGNOSIS — E782 Mixed hyperlipidemia: Secondary | ICD-10-CM | POA: Diagnosis not present

## 2023-09-06 DIAGNOSIS — Z6838 Body mass index (BMI) 38.0-38.9, adult: Secondary | ICD-10-CM | POA: Diagnosis not present

## 2023-09-06 DIAGNOSIS — E1159 Type 2 diabetes mellitus with other circulatory complications: Secondary | ICD-10-CM | POA: Diagnosis not present

## 2023-09-13 DIAGNOSIS — G4733 Obstructive sleep apnea (adult) (pediatric): Secondary | ICD-10-CM | POA: Diagnosis not present

## 2023-09-16 ENCOUNTER — Other Ambulatory Visit (HOSPITAL_COMMUNITY): Payer: Self-pay

## 2023-09-16 MED ORDER — PANTOPRAZOLE SODIUM 40 MG PO TBEC
40.0000 mg | DELAYED_RELEASE_TABLET | Freq: Every day | ORAL | 0 refills | Status: DC
  Filled 2023-09-16: qty 90, 90d supply, fill #0

## 2023-09-17 ENCOUNTER — Other Ambulatory Visit: Payer: Self-pay

## 2023-09-27 ENCOUNTER — Other Ambulatory Visit: Payer: Self-pay | Admitting: Nurse Practitioner

## 2023-09-27 ENCOUNTER — Other Ambulatory Visit (HOSPITAL_COMMUNITY): Payer: Self-pay

## 2023-09-28 ENCOUNTER — Other Ambulatory Visit (HOSPITAL_COMMUNITY): Payer: Self-pay

## 2023-09-28 MED ORDER — ROSUVASTATIN CALCIUM 40 MG PO TABS
40.0000 mg | ORAL_TABLET | Freq: Every day | ORAL | 3 refills | Status: DC
  Filled 2023-09-28 – 2023-12-01 (×2): qty 90, 90d supply, fill #0
  Filled 2024-03-03: qty 90, 90d supply, fill #1

## 2023-09-28 MED ORDER — EZETIMIBE 10 MG PO TABS
10.0000 mg | ORAL_TABLET | Freq: Every day | ORAL | 3 refills | Status: DC
Start: 2023-09-28 — End: 2024-04-13
  Filled 2023-09-28 – 2023-12-01 (×2): qty 90, 90d supply, fill #0
  Filled 2024-03-03: qty 90, 90d supply, fill #1

## 2023-09-28 MED ORDER — ESCITALOPRAM OXALATE 20 MG PO TABS
20.0000 mg | ORAL_TABLET | Freq: Every day | ORAL | 3 refills | Status: AC
  Filled 2023-09-28 – 2023-12-01 (×2): qty 90, 90d supply, fill #0
  Filled 2024-03-03: qty 90, 90d supply, fill #1
  Filled 2024-07-01: qty 90, 90d supply, fill #2

## 2023-09-29 ENCOUNTER — Other Ambulatory Visit: Payer: Self-pay

## 2023-09-29 ENCOUNTER — Encounter: Payer: Self-pay | Admitting: Pharmacist

## 2023-09-29 ENCOUNTER — Other Ambulatory Visit (HOSPITAL_COMMUNITY): Payer: Self-pay

## 2023-09-30 DIAGNOSIS — M272 Inflammatory conditions of jaws: Secondary | ICD-10-CM | POA: Diagnosis not present

## 2023-10-04 ENCOUNTER — Other Ambulatory Visit: Payer: Self-pay

## 2023-10-11 DIAGNOSIS — Z6837 Body mass index (BMI) 37.0-37.9, adult: Secondary | ICD-10-CM | POA: Diagnosis not present

## 2023-10-11 DIAGNOSIS — E1159 Type 2 diabetes mellitus with other circulatory complications: Secondary | ICD-10-CM | POA: Diagnosis not present

## 2023-10-11 DIAGNOSIS — E782 Mixed hyperlipidemia: Secondary | ICD-10-CM | POA: Diagnosis not present

## 2023-10-11 DIAGNOSIS — E66812 Obesity, class 2: Secondary | ICD-10-CM | POA: Diagnosis not present

## 2023-10-11 DIAGNOSIS — I10 Essential (primary) hypertension: Secondary | ICD-10-CM | POA: Diagnosis not present

## 2023-10-11 DIAGNOSIS — I2510 Atherosclerotic heart disease of native coronary artery without angina pectoris: Secondary | ICD-10-CM | POA: Diagnosis not present

## 2023-10-12 DIAGNOSIS — M7062 Trochanteric bursitis, left hip: Secondary | ICD-10-CM | POA: Diagnosis not present

## 2023-10-21 DIAGNOSIS — M170 Bilateral primary osteoarthritis of knee: Secondary | ICD-10-CM | POA: Diagnosis not present

## 2023-10-25 ENCOUNTER — Other Ambulatory Visit (HOSPITAL_COMMUNITY): Payer: Self-pay

## 2023-11-13 DIAGNOSIS — M5416 Radiculopathy, lumbar region: Secondary | ICD-10-CM | POA: Diagnosis not present

## 2023-11-22 DIAGNOSIS — I2510 Atherosclerotic heart disease of native coronary artery without angina pectoris: Secondary | ICD-10-CM | POA: Diagnosis not present

## 2023-11-22 DIAGNOSIS — Z6835 Body mass index (BMI) 35.0-35.9, adult: Secondary | ICD-10-CM | POA: Diagnosis not present

## 2023-11-22 DIAGNOSIS — E782 Mixed hyperlipidemia: Secondary | ICD-10-CM | POA: Diagnosis not present

## 2023-11-22 DIAGNOSIS — I10 Essential (primary) hypertension: Secondary | ICD-10-CM | POA: Diagnosis not present

## 2023-11-22 DIAGNOSIS — E1159 Type 2 diabetes mellitus with other circulatory complications: Secondary | ICD-10-CM | POA: Diagnosis not present

## 2023-12-01 ENCOUNTER — Other Ambulatory Visit (HOSPITAL_COMMUNITY): Payer: Self-pay

## 2023-12-01 ENCOUNTER — Other Ambulatory Visit: Payer: Self-pay | Admitting: Nurse Practitioner

## 2023-12-01 ENCOUNTER — Other Ambulatory Visit: Payer: Self-pay | Admitting: Cardiovascular Disease

## 2023-12-01 ENCOUNTER — Other Ambulatory Visit: Payer: Self-pay

## 2023-12-01 DIAGNOSIS — I10 Essential (primary) hypertension: Secondary | ICD-10-CM

## 2023-12-02 ENCOUNTER — Other Ambulatory Visit (HOSPITAL_COMMUNITY): Payer: Self-pay

## 2023-12-02 ENCOUNTER — Other Ambulatory Visit (HOSPITAL_BASED_OUTPATIENT_CLINIC_OR_DEPARTMENT_OTHER): Payer: Self-pay

## 2023-12-02 ENCOUNTER — Other Ambulatory Visit: Payer: Self-pay

## 2023-12-02 MED ORDER — LOSARTAN POTASSIUM 50 MG PO TABS
50.0000 mg | ORAL_TABLET | Freq: Every day | ORAL | 0 refills | Status: DC
  Filled 2023-12-02 – 2023-12-31 (×2): qty 90, 90d supply, fill #0

## 2023-12-02 MED ORDER — PANTOPRAZOLE SODIUM 40 MG PO TBEC
40.0000 mg | DELAYED_RELEASE_TABLET | Freq: Every day | ORAL | 0 refills | Status: DC
  Filled 2023-12-02: qty 90, 90d supply, fill #0

## 2023-12-04 ENCOUNTER — Other Ambulatory Visit (HOSPITAL_COMMUNITY): Payer: Self-pay

## 2023-12-09 ENCOUNTER — Other Ambulatory Visit: Payer: Self-pay

## 2023-12-09 ENCOUNTER — Other Ambulatory Visit (HOSPITAL_COMMUNITY): Payer: Self-pay

## 2023-12-10 ENCOUNTER — Other Ambulatory Visit (HOSPITAL_COMMUNITY): Payer: Self-pay

## 2023-12-10 MED ORDER — NITROGLYCERIN 0.4 MG SL SUBL
0.4000 mg | SUBLINGUAL_TABLET | SUBLINGUAL | 0 refills | Status: DC | PRN
  Filled 2023-12-10: qty 25, 30d supply, fill #0

## 2023-12-14 DIAGNOSIS — E119 Type 2 diabetes mellitus without complications: Secondary | ICD-10-CM | POA: Diagnosis not present

## 2024-01-01 ENCOUNTER — Other Ambulatory Visit (HOSPITAL_COMMUNITY): Payer: Self-pay

## 2024-01-03 ENCOUNTER — Other Ambulatory Visit: Payer: Self-pay

## 2024-01-04 DIAGNOSIS — Z6835 Body mass index (BMI) 35.0-35.9, adult: Secondary | ICD-10-CM | POA: Diagnosis not present

## 2024-01-04 DIAGNOSIS — E1159 Type 2 diabetes mellitus with other circulatory complications: Secondary | ICD-10-CM | POA: Diagnosis not present

## 2024-01-04 DIAGNOSIS — E559 Vitamin D deficiency, unspecified: Secondary | ICD-10-CM | POA: Diagnosis not present

## 2024-01-04 DIAGNOSIS — I10 Essential (primary) hypertension: Secondary | ICD-10-CM | POA: Diagnosis not present

## 2024-01-04 DIAGNOSIS — I2510 Atherosclerotic heart disease of native coronary artery without angina pectoris: Secondary | ICD-10-CM | POA: Diagnosis not present

## 2024-01-10 DIAGNOSIS — M1712 Unilateral primary osteoarthritis, left knee: Secondary | ICD-10-CM | POA: Diagnosis not present

## 2024-01-10 DIAGNOSIS — M7062 Trochanteric bursitis, left hip: Secondary | ICD-10-CM | POA: Diagnosis not present

## 2024-01-14 DIAGNOSIS — E119 Type 2 diabetes mellitus without complications: Secondary | ICD-10-CM | POA: Diagnosis not present

## 2024-01-21 DIAGNOSIS — M170 Bilateral primary osteoarthritis of knee: Secondary | ICD-10-CM | POA: Diagnosis not present

## 2024-01-28 ENCOUNTER — Other Ambulatory Visit (HOSPITAL_COMMUNITY): Payer: Self-pay

## 2024-02-10 DIAGNOSIS — G4733 Obstructive sleep apnea (adult) (pediatric): Secondary | ICD-10-CM | POA: Diagnosis not present

## 2024-02-14 DIAGNOSIS — E119 Type 2 diabetes mellitus without complications: Secondary | ICD-10-CM | POA: Diagnosis not present

## 2024-02-17 DIAGNOSIS — I2510 Atherosclerotic heart disease of native coronary artery without angina pectoris: Secondary | ICD-10-CM | POA: Diagnosis not present

## 2024-02-17 DIAGNOSIS — E66811 Obesity, class 1: Secondary | ICD-10-CM | POA: Diagnosis not present

## 2024-02-17 DIAGNOSIS — Z6834 Body mass index (BMI) 34.0-34.9, adult: Secondary | ICD-10-CM | POA: Diagnosis not present

## 2024-02-17 DIAGNOSIS — I10 Essential (primary) hypertension: Secondary | ICD-10-CM | POA: Diagnosis not present

## 2024-02-17 DIAGNOSIS — E559 Vitamin D deficiency, unspecified: Secondary | ICD-10-CM | POA: Diagnosis not present

## 2024-02-17 DIAGNOSIS — E1159 Type 2 diabetes mellitus with other circulatory complications: Secondary | ICD-10-CM | POA: Diagnosis not present

## 2024-02-23 ENCOUNTER — Other Ambulatory Visit: Payer: Self-pay

## 2024-02-24 ENCOUNTER — Other Ambulatory Visit (HOSPITAL_COMMUNITY): Payer: Self-pay

## 2024-03-01 ENCOUNTER — Other Ambulatory Visit (HOSPITAL_COMMUNITY): Payer: Self-pay

## 2024-03-02 ENCOUNTER — Other Ambulatory Visit (HOSPITAL_COMMUNITY): Payer: Self-pay

## 2024-03-02 ENCOUNTER — Other Ambulatory Visit: Payer: Self-pay

## 2024-03-02 MED ORDER — METOPROLOL SUCCINATE ER 25 MG PO TB24
25.0000 mg | ORAL_TABLET | Freq: Every day | ORAL | 0 refills | Status: DC
  Filled 2024-03-02: qty 90, 90d supply, fill #0

## 2024-03-03 ENCOUNTER — Other Ambulatory Visit (HOSPITAL_COMMUNITY): Payer: Self-pay

## 2024-03-07 DIAGNOSIS — M5416 Radiculopathy, lumbar region: Secondary | ICD-10-CM | POA: Diagnosis not present

## 2024-03-09 ENCOUNTER — Ambulatory Visit: Admitting: Cardiology

## 2024-03-13 NOTE — Progress Notes (Deleted)
 Cardiology Office Note    Date:  03/13/2024  ID:  Nicole Barry, Nicole Barry 1955/10/21, MRN [id number] PCP:  [facility]  Cardiologist:  Lonni Cash, MD  Electrophysiologist:  None   Chief Complaint: ***  History of Present Illness: .    Nicole Barry is a 68 y.o. female with visit-pertinent history of anxiety, depression, CAD, ischemic cardiomyopathy, chronic systolic CHF with recovery of EF, hypertension, hyperlipidemia, diabetes mellitus.  Patient previously followed by Dr. Mona.  Patient had stent to her LAD in June 2021, in setting of NSTEMI.  Echo at that time indicated EF 30 to 35%.  Patient at that time on left heart cath indicated 65% D1 stenosis and 30% and mLAD stenosis that was medically managed.  Echocardiogram in October 21 indicated LVEF 55 to 60%, no significant valve disease.  Patient with nuclear stress test in July 2022 with no ischemia.  Patient was last seen in clinic by Dr. Cash on 12/07/2022 to establish care.  The patient had remained stable standpoint.  CAD:  Ischemic cardiomyopathy:  HTN: Blood pressure otday  Hyperlipidemia: Last lipid profile indicated   Labwork independently reviewed:   ROS: .   *** denies chest pain, shortness of breath, lower extremity edema, fatigue, palpitations, melena, hematuria, hemoptysis, diaphoresis, weakness, presyncope, syncope, orthopnea, and PND.  All other systems are reviewed and otherwise negative.  Studies Reviewed: SABRA    EKG:  EKG is ordered today, personally reviewed, demonstrating ***     CV Studies: Cardiac studies reviewed are outlined and summarized above. Otherwise please see EMR for full report. Cardiac Studies & Procedures   ______________________________________________________________________________________________ CARDIAC CATHETERIZATION  CARDIAC CATHETERIZATION 12/05/2019  Conclusion  Prox LAD lesion is 99% stenosed.  1st Diag lesion is 65% stenosed.  Mid LAD lesion is 30%  stenosed.  A drug-eluting stent was successfully placed using a SYNERGY XD 3.50X16.  Post intervention, there is a 0% residual stenosis.  1. Severe proximal LAD stenosis 2. Successful PTCA/DES x 1 proximal LAD 3. Moderate stenosis in the ostium of a moderate caliber diagonal branch. 4. Elevated LVEDP  Recommendations: IV Lasix  x 1. Echo in am. Continue ASA and Brilinta  for one year. Continue statin and beta blocker. Medical management of Diagonal stenosis.  Findings Coronary Findings Diagnostic  Dominance: Right  Left Anterior Descending Vessel is large. Prox LAD lesion is 99% stenosed. Mid LAD lesion is 30% stenosed.  First Diagonal Branch Vessel is moderate in size. 1st Diag lesion is 65% stenosed.  Right Coronary Artery Vessel is large.  Intervention  Prox LAD lesion Stent CATH VISTA GUIDE 6FR XBLAD3.5 guide catheter was inserted. Lesion crossed with guidewire using a WIRE COUGAR XT STRL 190CM. Pre-stent angioplasty was performed using a BALLOON SAPPHIRE 2.0X12. A drug-eluting stent was successfully placed using a SYNERGY XD 3.50X16. Stent strut is well apposed. Post-stent angioplasty was performed using a BALLOON SAPPHIRE [place] [id number]. Post-Intervention Lesion Assessment The intervention was successful. Pre-interventional TIMI flow is 3. Post-intervention TIMI flow is 3. No complications occurred at this lesion. There is a 0% residual stenosis post intervention.   STRESS TESTS  MYOCARDIAL PERFUSION IMAGING 01/06/2021  Interpretation Summary  The left ventricular ejection fraction is normal (55-65%).  Nuclear stress EF: 64%.  No T wave inversion was noted during stress.  There was no ST segment deviation noted during stress.  This is a low risk study.  Normal perfusion. LVEF 64% with normal wall motion. This is a low risk study. No prior for  comparison.   ECHOCARDIOGRAM  ECHOCARDIOGRAM COMPLETE 03/18/2020  Narrative ECHOCARDIOGRAM REPORT    Patient  Name:   Nicole Barry     Date of Exam: 03/18/2020 Medical Rec #:  [id number]     Height:       58.5 in Accession #:    [id number]    Weight:       191.0 lb Date of Birth:  1956/03/02     BSA:          1.797 m Patient Age:    64 years      BP:           130/77 mmHg Patient Gender: F             HR:           61 bpm. Exam Location:  [address]  Procedure: 2D Echo, 3D Echo, Cardiac Doppler and Color Doppler  Indications:    I25.5 Ischemic Cardiomyopathy  History:        Patient has prior history of Echocardiogram examinations, most recent 12/06/2019. Cardiomyopathy, CAD and Previous Myocardial Infarction; Risk Factors:Diabetes, Hypertension, Dyslipidemia and Family History of Coronary Artery Disease. Obesity, NSTEMI (12-05-19, prior EF 30-35%).  Sonographer:    Heather Hawks RDCS Referring Phys: [id number] LUKE K KILROY  IMPRESSIONS   1. Left ventricular ejection fraction, by estimation, is 55 to 60%. Left ventricular ejection fraction by PLAX is 58 %. The left ventricle has normal function. The left ventricle has no regional wall motion abnormalities. Left ventricular diastolic parameters are consistent with Grade II diastolic dysfunction (pseudonormalization). Elevated left ventricular end-diastolic pressure. 2. Right ventricular systolic function is normal. The right ventricular size is normal. There is normal pulmonary artery systolic pressure. 3. The mitral valve is normal in structure. Trivial mitral valve regurgitation. No evidence of mitral stenosis. 4. The aortic valve is tricuspid. Aortic valve regurgitation is not visualized. No aortic stenosis is present. 5. Aortic dilatation noted. There is mild dilatation of the ascending aorta, measuring 41 mm. 6. The inferior vena cava is normal in size with greater than 50% respiratory variability, suggesting right atrial pressure of 3 mmHg.  FINDINGS Left Ventricle: Left ventricular ejection fraction, by estimation, is 55 to 60%. Left  ventricular ejection fraction by PLAX is 58 %. The left ventricle has normal function. The left ventricle has no regional wall motion abnormalities. The left ventricular internal cavity size was normal in size. There is no left ventricular hypertrophy. Left ventricular diastolic parameters are consistent with Grade II diastolic dysfunction (pseudonormalization). Elevated left ventricular end-diastolic pressure.  Right Ventricle: The right ventricular size is normal. No increase in right ventricular wall thickness. Right ventricular systolic function is normal. There is normal pulmonary artery systolic pressure. The tricuspid regurgitant velocity is 2.16 m/s, and with an assumed right atrial pressure of 3 mmHg, the estimated right ventricular systolic pressure is 21.7 mmHg.  Left Atrium: Left atrial size was normal in size.  Right Atrium: Right atrial size was normal in size.  Pericardium: There is no evidence of pericardial effusion.  Mitral Valve: The mitral valve is normal in structure. Trivial mitral valve regurgitation. No evidence of mitral valve stenosis.  Tricuspid Valve: The tricuspid valve is normal in structure. Tricuspid valve regurgitation is trivial. No evidence of tricuspid stenosis.  Aortic Valve: The aortic valve is tricuspid. Aortic valve regurgitation is not visualized. No aortic stenosis is present.  Pulmonic Valve: The pulmonic valve was normal in structure. Pulmonic valve regurgitation is not visualized. No  evidence of pulmonic stenosis.  Aorta: Aortic dilatation noted. There is mild dilatation of the ascending aorta, measuring 41 mm.  Venous: The inferior vena cava is normal in size with greater than 50% respiratory variability, suggesting right atrial pressure of 3 mmHg.  IAS/Shunts: No atrial level shunt detected by color flow Doppler.   LEFT VENTRICLE PLAX 2D LV EF:         Left            Diastology ventricular     LV e' medial:    7.29 cm/s ejection         LV E/e' medial:  14.3 fraction by     LV e' lateral:   8.92 cm/s PLAX is 58      LV E/e' lateral: 11.7 %. LVIDd:         4.90 cm LVIDs:         3.40 cm LV PW:         1.00 cm LV IVS:        0.75 cm         3D Volume EF: LVOT diam:     2.10 cm         3D EF:        74 % LV SV:         84              LV EDV:       141 ml LV SV Index:   47              LV ESV:       37 ml LVOT Area:     3.46 cm        LV SV:        104 ml   RIGHT VENTRICLE RV S prime:     11.60 cm/s TAPSE (M-mode): 2.0 cm  LEFT ATRIUM             Index       RIGHT ATRIUM           Index LA diam:        4.00 cm 2.23 cm/m  RA Area:     15.50 cm LA Vol (A2C):   52.7 ml 29.32 ml/m RA Volume:   42.60 ml  23.70 ml/m LA Vol (A4C):   44.2 ml 24.59 ml/m LA Biplane Vol: 49.3 ml 27.43 ml/m AORTIC VALVE LVOT Vmax:   87.60 cm/s LVOT Vmean:  60.450 cm/s LVOT VTI:    0.242 m  AORTA Ao Root diam: 3.10 cm Ao Asc diam:  4.10 cm  MITRAL VALVE                TRICUSPID VALVE MV Area (PHT): cm          TR Peak grad:   18.7 mmHg MV Decel Time: 239 msec     TR Vmax:        216.00 cm/s MV E velocity: 104.00 cm/s MV A velocity: 95.10 cm/s   SHUNTS MV E/A ratio:  1.09         Systemic VTI:  0.24 m Systemic Diam: 2.10 cm  Annabella Scarce MD Electronically signed by Annabella Scarce MD Signature Date/Time: 03/18/2020/3:21:26 PM    Final          ______________________________________________________________________________________________       Current Reported Medications:.    No outpatient medications have been marked as taking for the 03/16/24 encounter (Appointment) with Gabryel Talamo  D, NP.    Physical Exam:    VS:  There were no vitals taken for this visit.   Wt Readings from Last 3 Encounters:  12/07/22 192 lb 6.4 oz (87.3 kg)  10/15/21 199 lb 12.8 oz (90.6 kg)  02/05/21 199 lb 9.6 oz (90.5 kg)    GEN: Well nourished, well developed in no acute distress NECK: No JVD; No carotid bruits CARDIAC:  ***RRR, no murmurs, rubs, gallops RESPIRATORY:  Clear to auscultation without rales, wheezing or rhonchi  ABDOMEN: Soft, non-tender, non-distended EXTREMITIES:  No edema; No acute deformity     Asessement and Plan:.     ***     Disposition: F/u with ***  Signed, Fatiha Guzy D Scotlyn Mccranie, NP

## 2024-03-16 ENCOUNTER — Ambulatory Visit: Admitting: Cardiology

## 2024-03-16 DIAGNOSIS — I10 Essential (primary) hypertension: Secondary | ICD-10-CM

## 2024-03-16 DIAGNOSIS — I2510 Atherosclerotic heart disease of native coronary artery without angina pectoris: Secondary | ICD-10-CM

## 2024-03-16 DIAGNOSIS — E785 Hyperlipidemia, unspecified: Secondary | ICD-10-CM

## 2024-03-16 DIAGNOSIS — I255 Ischemic cardiomyopathy: Secondary | ICD-10-CM

## 2024-03-27 ENCOUNTER — Other Ambulatory Visit (HOSPITAL_COMMUNITY): Payer: Self-pay

## 2024-03-28 ENCOUNTER — Other Ambulatory Visit (HOSPITAL_COMMUNITY): Payer: Self-pay

## 2024-03-28 ENCOUNTER — Other Ambulatory Visit: Payer: Self-pay | Admitting: Cardiovascular Disease

## 2024-03-28 ENCOUNTER — Other Ambulatory Visit: Payer: Self-pay

## 2024-03-28 DIAGNOSIS — I10 Essential (primary) hypertension: Secondary | ICD-10-CM

## 2024-03-28 MED ORDER — PANTOPRAZOLE SODIUM 40 MG PO TBEC
40.0000 mg | DELAYED_RELEASE_TABLET | Freq: Every day | ORAL | 0 refills | Status: DC
  Filled 2024-03-28: qty 90, 90d supply, fill #0

## 2024-03-28 MED ORDER — METOPROLOL SUCCINATE ER 25 MG PO TB24
25.0000 mg | ORAL_TABLET | Freq: Every day | ORAL | 0 refills | Status: DC
  Filled 2024-03-28: qty 90, 90d supply, fill #0

## 2024-03-29 ENCOUNTER — Other Ambulatory Visit (HOSPITAL_COMMUNITY): Payer: Self-pay

## 2024-03-29 MED ORDER — PANTOPRAZOLE SODIUM 40 MG PO TBEC
40.0000 mg | DELAYED_RELEASE_TABLET | Freq: Every day | ORAL | 0 refills | Status: AC
  Filled 2024-04-29 – 2024-07-01 (×4): qty 90, 90d supply, fill #0

## 2024-03-30 DIAGNOSIS — Z6834 Body mass index (BMI) 34.0-34.9, adult: Secondary | ICD-10-CM | POA: Diagnosis not present

## 2024-03-30 DIAGNOSIS — I2510 Atherosclerotic heart disease of native coronary artery without angina pectoris: Secondary | ICD-10-CM | POA: Diagnosis not present

## 2024-03-30 DIAGNOSIS — E1159 Type 2 diabetes mellitus with other circulatory complications: Secondary | ICD-10-CM | POA: Diagnosis not present

## 2024-03-30 DIAGNOSIS — I10 Essential (primary) hypertension: Secondary | ICD-10-CM | POA: Diagnosis not present

## 2024-03-30 DIAGNOSIS — E559 Vitamin D deficiency, unspecified: Secondary | ICD-10-CM | POA: Diagnosis not present

## 2024-03-30 DIAGNOSIS — E66811 Obesity, class 1: Secondary | ICD-10-CM | POA: Diagnosis not present

## 2024-03-31 ENCOUNTER — Other Ambulatory Visit: Payer: Self-pay

## 2024-03-31 ENCOUNTER — Other Ambulatory Visit (HOSPITAL_COMMUNITY): Payer: Self-pay

## 2024-03-31 MED ORDER — LOSARTAN POTASSIUM 50 MG PO TABS
50.0000 mg | ORAL_TABLET | Freq: Every day | ORAL | 0 refills | Status: DC
Start: 2024-03-31 — End: 2024-04-13
  Filled 2024-03-31: qty 30, 30d supply, fill #0

## 2024-04-05 ENCOUNTER — Other Ambulatory Visit: Payer: Self-pay

## 2024-04-05 DIAGNOSIS — M542 Cervicalgia: Secondary | ICD-10-CM | POA: Diagnosis not present

## 2024-04-05 MED ORDER — CYCLOBENZAPRINE HCL 5 MG PO TABS
5.0000 mg | ORAL_TABLET | Freq: Three times a day (TID) | ORAL | 0 refills | Status: DC | PRN
  Filled 2024-04-05: qty 21, 7d supply, fill #0

## 2024-04-11 NOTE — Progress Notes (Unsigned)
 Cardiology Office Note    Date:  04/13/2024  ID:  Nicole, Barry [birth year], MRN [id number] PCP:  [facility]  Cardiologist:  Lonni Cash, MD  Electrophysiologist:  None   Chief Complaint: jaw pain  History of Present Illness: .    Nicole Barry is a 68 y.o. female with visit-pertinent history of anxiety, depression, CAD, ischemic cardiomyopathy, chronic systolic CHF with recovery of EF, hypertension, hyperlipidemia, diabetes mellitus.  Patient previously followed by Dr. Mona.  Patient had stent to her LAD in June 2021, in setting of NSTEMI.  Echo at that time indicated EF 30 to 35%.  Patient at that time on left heart cath indicated 65% D1 stenosis and 30% and mLAD stenosis that was medically managed.  Echocardiogram in October 21 indicated LVEF 55 to 60%, no significant valve disease.  Patient with nuclear stress test in July 2022 with no ischemia.  Patient was last seen in clinic by Dr. Cash on 12/07/2022 to establish care.  The patient had remained stable standpoint.  Today, she presents with coronary artery disease and aortic aneurysm with jaw pain and concerns about cardiac symptoms.  She began experiencing neck and back discomfort a week ago, followed by a severe headache. Her blood pressure was 124/70 mmHg. Flexeril alleviated her neck pain. Significant jaw pain occurred, similar to previous heart attack symptoms. An x-ray at the dentist showed no abnormalities. She took nitroglycerin , which may have been expired, and Naprosyn , with plans to call 911 if symptoms worsened. The pain subsided.  She has a history of stent placement in the LAD and was informed of two other blockages (65% and 30%) in 2021, along with an aortic aneurysm. She has not had recent coronary evaluations but has annual EKGs. She reports weight loss since 2021 and improved cholesterol levels, with her last blood work showing all levels within normal limits.  She recalls an episode  of dizziness two weeks ago, attributed to low blood sugar, which resolved after consuming a shake. No recent shortness of breath, swelling, or palpitations. Her last labs were in January of the previous year, with plans for a physical in January of the upcoming year. Her LDL was 57, and her potassium was slightly elevated but not concerning  Reports no chest pain, pressure, or tightness. No edema, orthopnea, PND. Reports no palpitations.   Discussed the use of AI scribe software for clinical note transcription with the patient, who gave verbal consent to proceed.  ROS: .   Pertinent ROS in HPI   Studies Reviewed: SABRA    EKG:  EKG is ordered today, personally reviewed, demonstrating normal sinus rhythm, heart rate 66 bpm, no ST or T wave changes. EKG Interpretation Date/Time:  Thursday April 13 2024 10:39:52 EDT Ventricular Rate:  66 PR Interval:  152 QRS Duration:  86 QT Interval:  416 QTC Calculation: 436 R Axis:   -6  Text Interpretation: Normal sinus rhythm Normal ECG When compared with ECG of 07-Dec-2022 16:13, No significant change was found Confirmed by Lucien Blanc [phone]) on 04/13/2024 10:52:19 AM   CV Studies: Cardiac studies reviewed are outlined and summarized above. Otherwise please see EMR for full report. Cardiac Studies & Procedures   ______________________________________________________________________________________________ CARDIAC CATHETERIZATION  CARDIAC CATHETERIZATION 12/05/2019  Conclusion  Prox LAD lesion is 99% stenosed.  1st Diag lesion is 65% stenosed.  Mid LAD lesion is 30% stenosed.  A drug-eluting stent was successfully placed using a SYNERGY XD 3.50X16.  Post intervention, there  is a 0% residual stenosis.  1. Severe proximal LAD stenosis 2. Successful PTCA/DES x 1 proximal LAD 3. Moderate stenosis in the ostium of a moderate caliber diagonal branch. 4. Elevated LVEDP  Recommendations: IV Lasix  x 1. Echo in am. Continue ASA and Brilinta  for  one year. Continue statin and beta blocker. Medical management of Diagonal stenosis.  Findings Coronary Findings Diagnostic  Dominance: Right  Left Anterior Descending Vessel is large. Prox LAD lesion is 99% stenosed. Mid LAD lesion is 30% stenosed.  First Diagonal Branch Vessel is moderate in size. 1st Diag lesion is 65% stenosed.  Right Coronary Artery Vessel is large.  Intervention  Prox LAD lesion Stent CATH VISTA GUIDE 6FR XBLAD3.5 guide catheter was inserted. Lesion crossed with guidewire using a WIRE COUGAR XT STRL 190CM. Pre-stent angioplasty was performed using a BALLOON SAPPHIRE 2.0X12. A drug-eluting stent was successfully placed using a SYNERGY XD 3.50X16. Stent strut is well apposed. Post-stent angioplasty was performed using a BALLOON SAPPHIRE [place] [id number]. Post-Intervention Lesion Assessment The intervention was successful. Pre-interventional TIMI flow is 3. Post-intervention TIMI flow is 3. No complications occurred at this lesion. There is a 0% residual stenosis post intervention.   STRESS TESTS  MYOCARDIAL PERFUSION IMAGING 01/06/2021  Interpretation Summary  The left ventricular ejection fraction is normal (55-65%).  Nuclear stress EF: 64%.  No T wave inversion was noted during stress.  There was no ST segment deviation noted during stress.  This is a low risk study.  Normal perfusion. LVEF 64% with normal wall motion. This is a low risk study. No prior for comparison.   ECHOCARDIOGRAM  ECHOCARDIOGRAM COMPLETE 03/18/2020  Narrative ECHOCARDIOGRAM REPORT    Patient Name:   Nicole Barry     Date of Exam: 03/18/2020 Medical Rec #:  [id number]     Height:       58.5 in Accession #:    [id number]    Weight:       191.0 lb Date of Birth:  04/13/1956     BSA:          1.797 m Patient Age:    64 years      BP:           130/77 mmHg Patient Gender: F             HR:           61 bpm. Exam Location:  [address]  Procedure: 2D Echo, 3D Echo, Cardiac  Doppler and Color Doppler  Indications:    I25.5 Ischemic Cardiomyopathy  History:        Patient has prior history of Echocardiogram examinations, most recent 12/06/2019. Cardiomyopathy, CAD and Previous Myocardial Infarction; Risk Factors:Diabetes, Hypertension, Dyslipidemia and Family History of Coronary Artery Disease. Obesity, NSTEMI (12-05-19, prior EF 30-35%).  Sonographer:    Heather Hawks RDCS Referring Phys: [id number] LUKE K KILROY  IMPRESSIONS   1. Left ventricular ejection fraction, by estimation, is 55 to 60%. Left ventricular ejection fraction by PLAX is 58 %. The left ventricle has normal function. The left ventricle has no regional wall motion abnormalities. Left ventricular diastolic parameters are consistent with Grade II diastolic dysfunction (pseudonormalization). Elevated left ventricular end-diastolic pressure. 2. Right ventricular systolic function is normal. The right ventricular size is normal. There is normal pulmonary artery systolic pressure. 3. The mitral valve is normal in structure. Trivial mitral valve regurgitation. No evidence of mitral stenosis. 4. The aortic valve is tricuspid. Aortic valve regurgitation is not visualized. No aortic  stenosis is present. 5. Aortic dilatation noted. There is mild dilatation of the ascending aorta, measuring 41 mm. 6. The inferior vena cava is normal in size with greater than 50% respiratory variability, suggesting right atrial pressure of 3 mmHg.  FINDINGS Left Ventricle: Left ventricular ejection fraction, by estimation, is 55 to 60%. Left ventricular ejection fraction by PLAX is 58 %. The left ventricle has normal function. The left ventricle has no regional wall motion abnormalities. The left ventricular internal cavity size was normal in size. There is no left ventricular hypertrophy. Left ventricular diastolic parameters are consistent with Grade II diastolic dysfunction (pseudonormalization). Elevated left ventricular  end-diastolic pressure.  Right Ventricle: The right ventricular size is normal. No increase in right ventricular wall thickness. Right ventricular systolic function is normal. There is normal pulmonary artery systolic pressure. The tricuspid regurgitant velocity is 2.16 m/s, and with an assumed right atrial pressure of 3 mmHg, the estimated right ventricular systolic pressure is 21.7 mmHg.  Left Atrium: Left atrial size was normal in size.  Right Atrium: Right atrial size was normal in size.  Pericardium: There is no evidence of pericardial effusion.  Mitral Valve: The mitral valve is normal in structure. Trivial mitral valve regurgitation. No evidence of mitral valve stenosis.  Tricuspid Valve: The tricuspid valve is normal in structure. Tricuspid valve regurgitation is trivial. No evidence of tricuspid stenosis.  Aortic Valve: The aortic valve is tricuspid. Aortic valve regurgitation is not visualized. No aortic stenosis is present.  Pulmonic Valve: The pulmonic valve was normal in structure. Pulmonic valve regurgitation is not visualized. No evidence of pulmonic stenosis.  Aorta: Aortic dilatation noted. There is mild dilatation of the ascending aorta, measuring 41 mm.  Venous: The inferior vena cava is normal in size with greater than 50% respiratory variability, suggesting right atrial pressure of 3 mmHg.  IAS/Shunts: No atrial level shunt detected by color flow Doppler.   LEFT VENTRICLE PLAX 2D LV EF:         Left            Diastology ventricular     LV e' medial:    7.29 cm/s ejection        LV E/e' medial:  14.3 fraction by     LV e' lateral:   8.92 cm/s PLAX is 58      LV E/e' lateral: 11.7 %. LVIDd:         4.90 cm LVIDs:         3.40 cm LV PW:         1.00 cm LV IVS:        0.75 cm         3D Volume EF: LVOT diam:     2.10 cm         3D EF:        74 % LV SV:         84              LV EDV:       141 ml LV SV Index:   47              LV ESV:       37 ml LVOT  Area:     3.46 cm        LV SV:        104 ml   RIGHT VENTRICLE RV S prime:     11.60 cm/s TAPSE (M-mode): 2.0 cm  LEFT ATRIUM  Index       RIGHT ATRIUM           Index LA diam:        4.00 cm 2.23 cm/m  RA Area:     15.50 cm LA Vol (A2C):   52.7 ml 29.32 ml/m RA Volume:   42.60 ml  23.70 ml/m LA Vol (A4C):   44.2 ml 24.59 ml/m LA Biplane Vol: 49.3 ml 27.43 ml/m AORTIC VALVE LVOT Vmax:   87.60 cm/s LVOT Vmean:  60.450 cm/s LVOT VTI:    0.242 m  AORTA Ao Root diam: 3.10 cm Ao Asc diam:  4.10 cm  MITRAL VALVE                TRICUSPID VALVE MV Area (PHT): cm          TR Peak grad:   18.7 mmHg MV Decel Time: 239 msec     TR Vmax:        216.00 cm/s MV E velocity: 104.00 cm/s MV A velocity: 95.10 cm/s   SHUNTS MV E/A ratio:  1.09         Systemic VTI:  0.24 m Systemic Diam: 2.10 cm  Annabella Scarce MD Electronically signed by Annabella Scarce MD Signature Date/Time: 03/18/2020/3:21:26 PM    Final          ______________________________________________________________________________________________       Current Reported Medications:.    Current Meds  Medication Sig   albuterol  (VENTOLIN  HFA) 108 (90 Base) MCG/ACT inhaler INHALE 2 PUFFS INTO THE LUNGS EVERY 6 (SIX) HOURS AS NEEDED FOR WHEEZING OR SHORTNESS OF BREATH.   aspirin  EC 81 MG EC tablet Take 1 tablet (81 mg total) by mouth daily. Swallow whole.   cyclobenzaprine (FLEXERIL) 5 MG tablet Take 1 tablet (5 mg total) by mouth 3 (three) times daily as needed.   escitalopram  (LEXAPRO ) 20 MG tablet Take 1 tablet (20 mg total) by mouth daily.   gabapentin  (NEURONTIN ) 300 MG capsule Take 1 capsule (300 mg total) by mouth 3 (three) times daily.   loratadine  (CLARITIN ) 10 MG tablet Take 10 mg by mouth daily.   pantoprazole  (PROTONIX ) 40 MG tablet Take 1 tablet (40 mg total) by mouth daily.   saccharomyces boulardii (FLORASTOR) 250 MG capsule 1 capsule Orally twice a day 90 days   Semaglutide , 2  MG/DOSE, (OZEMPIC , 2 MG/DOSE,) 8 MG/3ML SOPN Inject 2 mg into the skin once a week.   [DISCONTINUED] ezetimibe  (ZETIA ) 10 MG tablet Take 1 tablet (10 mg total) by mouth daily.   [DISCONTINUED] losartan  (COZAAR ) 50 MG tablet Take 1 tablet (50 mg total) by mouth daily. Please schedule yearly appointment for future refills. Thank you   [DISCONTINUED] metoprolol  succinate (TOPROL -XL) 25 MG 24 hr tablet Take 1 tablet (25 mg total) by mouth daily.   [DISCONTINUED] nitroGLYCERIN  (NITROSTAT ) 0.4 MG SL tablet Place 1 tablet (0.4 mg total) under the tongue every 5 (five) minutes as needed for chest pain.   [DISCONTINUED] rosuvastatin  (CRESTOR ) 40 MG tablet Take 1 tablet (40 mg total) by mouth daily.    Physical Exam:    VS:  BP 128/74   Pulse 66   Ht 4' 10 (1.473 m)   Wt 161 lb 9.6 oz (73.3 kg)   SpO2 98%   BMI 33.77 kg/m    Wt Readings from Last 3 Encounters:  04/13/24 161 lb 9.6 oz (73.3 kg)  12/07/22 192 lb 6.4 oz (87.3 kg)  10/15/21 199 lb 12.8 oz (90.6 kg)  GEN: Well nourished, well developed in no acute distress NECK: No JVD; No carotid bruits CARDIAC: RRR, no murmurs, rubs, gallops RESPIRATORY:  Clear to auscultation without rales, wheezing or rhonchi  ABDOMEN: Soft, non-tender, non-distended EXTREMITIES:  No edema; No acute deformity     Asessement and Plan:.    Jaw pain with evaluation for possible cardiac etiology Jaw pain with characteristics similar to previous myocardial infarction symptoms. Possible cardiac etiology prioritized due to history of coronary artery disease. - Order chemical stress test to evaluate for cardiac ischemia. - Update prescription for nitroglycerin . - Consider referral to orthopedics if cardiac etiology is ruled out.  Atherosclerotic heart disease of native coronary artery with history of stent placement and residual coronary artery disease Stent placement in LAD with residual coronary artery disease. Current symptoms warrant further cardiac  evaluation. - Order updated echocardiogram to assess heart pump function. - Review results of stress test. - Ensure continuation of current medication regimen including Toprol .  Aortic aneurysm, surveillance needed Aortic aneurysm requires surveillance to monitor for changes. - Order updated echocardiogram to assess aortic aneurysm status.  Intermittent palpitations, currently asymptomatic Intermittent palpitations noted, currently asymptomatic. Occasional PVCs or PACs suspected. - Consider heart monitor if symptoms recur.  Hyperlipidemia, well-controlled Hyperlipidemia well-controlled with current treatment regimen. - Continue current lipid-lowering therapy. - Review lipid panel results at upcoming primary care visit in January.   Essential hypertension, well-controlled Hypertension well-controlled with current medication regimen. - Continue current antihypertensive therapy.   Disposition: F/u with me in 4 to 6 weeks after testing  Signed, Orren LOISE Fabry, PA-C

## 2024-04-13 ENCOUNTER — Ambulatory Visit: Attending: Internal Medicine | Admitting: Physician Assistant

## 2024-04-13 ENCOUNTER — Encounter: Payer: Self-pay | Admitting: Physician Assistant

## 2024-04-13 ENCOUNTER — Other Ambulatory Visit: Payer: Self-pay

## 2024-04-13 VITALS — BP 128/74 | HR 66 | Ht <= 58 in | Wt 161.6 lb

## 2024-04-13 DIAGNOSIS — E785 Hyperlipidemia, unspecified: Secondary | ICD-10-CM

## 2024-04-13 DIAGNOSIS — Z9861 Coronary angioplasty status: Secondary | ICD-10-CM

## 2024-04-13 DIAGNOSIS — I10 Essential (primary) hypertension: Secondary | ICD-10-CM | POA: Diagnosis not present

## 2024-04-13 DIAGNOSIS — I2510 Atherosclerotic heart disease of native coronary artery without angina pectoris: Secondary | ICD-10-CM | POA: Diagnosis not present

## 2024-04-13 MED ORDER — EZETIMIBE 10 MG PO TABS
10.0000 mg | ORAL_TABLET | Freq: Every day | ORAL | 3 refills | Status: AC
  Filled 2024-04-13 – 2024-07-01 (×2): qty 90, 90d supply, fill #0

## 2024-04-13 MED ORDER — NITROGLYCERIN 0.4 MG SL SUBL
0.4000 mg | SUBLINGUAL_TABLET | SUBLINGUAL | 3 refills | Status: AC | PRN
  Filled 2024-04-13: qty 25, 1d supply, fill #0
  Filled 2024-04-13: qty 25, 9d supply, fill #0

## 2024-04-13 MED ORDER — LOSARTAN POTASSIUM 50 MG PO TABS
50.0000 mg | ORAL_TABLET | Freq: Every day | ORAL | 3 refills | Status: AC
  Filled 2024-04-13 – 2024-04-29 (×2): qty 90, 90d supply, fill #0

## 2024-04-13 MED ORDER — METOPROLOL SUCCINATE ER 25 MG PO TB24
25.0000 mg | ORAL_TABLET | Freq: Every day | ORAL | 3 refills | Status: AC
  Filled 2024-04-13 – 2024-05-13 (×2): qty 90, 90d supply, fill #0

## 2024-04-13 MED ORDER — ROSUVASTATIN CALCIUM 40 MG PO TABS
40.0000 mg | ORAL_TABLET | Freq: Every day | ORAL | 3 refills | Status: AC
  Filled 2024-04-13 – 2024-07-01 (×2): qty 90, 90d supply, fill #0

## 2024-04-13 MED ORDER — HYDROCODONE-ACETAMINOPHEN 5-325 MG PO TABS
1.0000 | ORAL_TABLET | Freq: Four times a day (QID) | ORAL | 0 refills | Status: DC | PRN
  Filled 2024-04-13: qty 20, 5d supply, fill #0

## 2024-04-13 NOTE — Patient Instructions (Signed)
 Medication Instructions:  Your physician recommends that you continue on your current medications as directed. Please refer to the Current Medication list given to you today.  *If you need a refill on your cardiac medications before your next appointment, please call your pharmacy*  Lab Work: None If you have labs (blood work) drawn today and your tests are completely normal, you will receive your results only by: MyChart Message (if you have MyChart) OR A paper copy in the mail If you have any lab test that is abnormal or we need to change your treatment, we will call you to review the results.  Testing/Procedures: Your physician has requested that you have a lexiscan  myoview . For further information please visit [email or web address] Please follow instruction sheet, as given.  Your physician has requested that you have an echocardiogram. Echocardiography is a painless test that uses sound waves to create images of your heart. It provides your doctor with information about the size and shape of your heart and how well your heart's chambers and valves are working. This procedure takes approximately one hour. There are no restrictions for this procedure. Please do NOT wear cologne, perfume, aftershave, or lotions (deodorant is allowed). Please arrive 15 minutes prior to your appointment time.  Please note: We ask at that you not bring children with you during ultrasound (echo/ vascular) testing. Due to room size and safety concerns, children are not allowed in the ultrasound rooms during exams. Our front office staff cannot provide observation of children in our lobby area while testing is being conducted. An adult accompanying a patient to their appointment will only be allowed in the ultrasound room at the discretion of the ultrasound technician under special circumstances. We apologize for any inconvenience.   Follow-Up: At [facility], you and your health needs are our priority.   As part of our continuing mission to provide you with exceptional heart care, our providers are all part of one team.  This team includes your primary Cardiologist (physician) and Advanced Practice Providers or APPs (Physician Assistants and Nurse Practitioners) who all work together to provide you with the care you need, when you need it.  Your next appointment:   4-6 weeks  Provider:   Orren Fabry, PA or any APP  We recommend signing up for the patient portal called MyChart.  Sign up information is provided on this After Visit Summary.  MyChart is used to connect with patients for Virtual Visits (Telemedicine).  Patients are able to view lab/test results, encounter notes, upcoming appointments, etc.  Non-urgent messages can be sent to your provider as well.   To learn more about what you can do with MyChart, go to [email or web address].

## 2024-04-14 ENCOUNTER — Other Ambulatory Visit: Payer: Self-pay

## 2024-04-19 ENCOUNTER — Telehealth (HOSPITAL_COMMUNITY): Payer: Self-pay | Admitting: *Deleted

## 2024-04-19 NOTE — Telephone Encounter (Signed)
 Pt given instructions for stress test.

## 2024-04-24 ENCOUNTER — Other Ambulatory Visit: Payer: Self-pay | Admitting: Physician Assistant

## 2024-04-24 DIAGNOSIS — I10 Essential (primary) hypertension: Secondary | ICD-10-CM

## 2024-04-24 DIAGNOSIS — E785 Hyperlipidemia, unspecified: Secondary | ICD-10-CM

## 2024-04-24 DIAGNOSIS — I2510 Atherosclerotic heart disease of native coronary artery without angina pectoris: Secondary | ICD-10-CM

## 2024-04-27 ENCOUNTER — Ambulatory Visit (HOSPITAL_COMMUNITY)
Admission: RE | Admit: 2024-04-27 | Discharge: 2024-04-27 | Disposition: A | Discharge status: Home / Self Care | Source: Ambulatory Visit | Attending: Cardiology | Admitting: Cardiology

## 2024-04-27 ENCOUNTER — Ambulatory Visit (HOSPITAL_COMMUNITY)
Admission: RE | Admit: 2024-04-27 | Discharge: 2024-04-27 | Disposition: A | Source: Ambulatory Visit | Attending: Physician Assistant | Admitting: Physician Assistant

## 2024-04-27 DIAGNOSIS — Z9861 Coronary angioplasty status: Secondary | ICD-10-CM

## 2024-04-27 DIAGNOSIS — I10 Essential (primary) hypertension: Secondary | ICD-10-CM

## 2024-04-27 DIAGNOSIS — E785 Hyperlipidemia, unspecified: Secondary | ICD-10-CM

## 2024-04-27 DIAGNOSIS — I2510 Atherosclerotic heart disease of native coronary artery without angina pectoris: Secondary | ICD-10-CM

## 2024-04-27 LAB — MYOCARDIAL PERFUSION IMAGING
Rest Nuclear Isotope Dose: 9.5 mCi
SDS: 0
SRS: 5
SSS: 2
ST Depression (mm): 0 mm
Stress Nuclear Isotope Dose: 30.2 mCi
TID: 1.02

## 2024-04-27 LAB — ECHOCARDIOGRAM COMPLETE
Area-P 1/2: 4.11 cm2
S' Lateral: 3.41 cm

## 2024-04-27 MED ORDER — REGADENOSON 0.4 MG/5ML IV SOLN
0.4000 mg | Freq: Once | INTRAVENOUS | Status: AC
  Administered 2024-04-27: 0.4 mg via INTRAVENOUS

## 2024-04-27 MED ORDER — TECHNETIUM TC 99M TETROFOSMIN IV KIT
30.2000 | PACK | Freq: Once | INTRAVENOUS | Status: AC | PRN
  Administered 2024-04-27: 30.2 via INTRAVENOUS

## 2024-04-27 MED ORDER — REGADENOSON 0.4 MG/5ML IV SOLN
INTRAVENOUS | Status: AC
  Filled 2024-04-27: qty 5

## 2024-04-27 MED ORDER — TECHNETIUM TC 99M TETROFOSMIN IV KIT
9.5000 | PACK | Freq: Once | INTRAVENOUS | Status: AC | PRN
  Administered 2024-04-27: 9.5 via INTRAVENOUS

## 2024-04-28 ENCOUNTER — Ambulatory Visit: Payer: Self-pay | Admitting: Physician Assistant

## 2024-05-01 ENCOUNTER — Other Ambulatory Visit (HOSPITAL_COMMUNITY): Payer: Self-pay

## 2024-05-01 ENCOUNTER — Other Ambulatory Visit: Payer: Self-pay

## 2024-05-02 ENCOUNTER — Other Ambulatory Visit: Payer: Self-pay

## 2024-05-02 NOTE — Telephone Encounter (Signed)
 Spoke with pt regarding test results. Pt verbalized understanding. Pt was advised to call our office if she has any questions.

## 2024-05-08 NOTE — Progress Notes (Signed)
 Cardiology Office Note   Date:  05/16/2024  ID:  Nicole Barry, Nicole Barry [birth year], MRN [id number] PCP:  [facility]  Cardiologist:  Lonni Cash, MD  Electrophysiologist:  None   Chief Complaint: Follow up for CAD  History of Present Illness: .   Nicole Barry is a 68 y.o. female with visit-pertinent history of anxiety, depression, CAD, ischemic cardiomyopathy, chronic systolic CHF with recovery of EF, hypertension, hyperlipidemia, diabetes mellitus.  Patient previously followed by Dr. Mona.   Patient had stent to her LAD in June 2021, in setting of NSTEMI.  Echo at that time indicated EF 30 to 35%.  Patient at that time on left heart cath indicated 65% D1 stenosis and 30% and mLAD stenosis that was medically managed.  Echocardiogram in October 21 indicated LVEF 55 to 60%, no significant valve disease.  Patient with nuclear stress test in July 2022 with no ischemia.   Patient was seen in clinic by Dr. Cash on 12/07/2022 to establish care.  The patient had remained stable from a cardiac standpoint.  Patient was last seen in clinic on 04/13/2024 by Orren Fabry, PA.  Patient reported experiencing neck and back discomfort week prior to appointment, followed by headache.  Her blood pressure had been stable.  It was noted that Flexeril  alleviated her neck pain.  She did have significant jaw pain, similar to her previous heart attack symptoms.  Per notes patient took nitroglycerin  which may have been expired and Naproxen  with relief in symptoms.  Nuclear stress test was ordered for further evaluation, nuclear stress test on 04/27/2024 was normal and low risk, noted to have frequent PVCs.  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   Today  she reports that she has been doing well. She denies chest pain, shortness of breath or any further jaw pain.  She notes that she has been having problems with pain in the back of her neck, she is planning to follow with her PCP or a chiropractor regarding ongoing pain.  She denies any palpitations, lower extremity edema, orthopnea or PND.  Patient reports that from a cardiac standpoint she has been doing very well.  She does try to routinely exercise, she tries to lift weights and uses a stationary bike at home. ROS: .   Today she denies chest pain, shortness of breath, lower extremity edema, fatigue, palpitations, melena, hematuria, hemoptysis, diaphoresis, weakness, presyncope, syncope, orthopnea, and PND.  All other systems are reviewed and otherwise negative. Studies Reviewed: SABRA   EKG:  EKG is not ordered today.  CV Studies: Cardiac studies reviewed are outlined and summarized above. Otherwise please see EMR for full report. Cardiac Studies & Procedures   ______________________________________________________________________________________________ CARDIAC CATHETERIZATION  CARDIAC CATHETERIZATION 12/05/2019  Conclusion  Prox LAD lesion is 99% stenosed.  1st Diag lesion is 65% stenosed.  Mid LAD lesion is 30% stenosed.  A drug-eluting stent was successfully placed using a SYNERGY XD 3.50X16.  Post intervention, there is a 0% residual stenosis.  1. Severe proximal LAD stenosis 2. Successful PTCA/DES x 1 proximal LAD 3. Moderate stenosis in the ostium of a moderate caliber diagonal branch. 4. Elevated LVEDP  Recommendations: IV Lasix  x 1. Echo in am. Continue ASA and Brilinta  for one year. Continue statin and beta  blocker. Medical management of Diagonal stenosis.  Findings Coronary Findings Diagnostic  Dominance: Right  Left Anterior Descending Vessel is large. Prox LAD lesion is 99% stenosed. Mid LAD lesion is 30% stenosed.  First Diagonal Branch Vessel is moderate in  size. 1st Diag lesion is 65% stenosed.  Right Coronary Artery Vessel is large.  Intervention  Prox LAD lesion Stent CATH VISTA GUIDE 6FR XBLAD3.5 guide catheter was inserted. Lesion crossed with guidewire using a WIRE COUGAR XT STRL 190CM. Pre-stent angioplasty was performed using a BALLOON SAPPHIRE 2.0X12. A drug-eluting stent was successfully placed using a SYNERGY XD 3.50X16. Stent strut is well apposed. Post-stent angioplasty was performed using a BALLOON SAPPHIRE [place] [id number]. Post-Intervention Lesion Assessment The intervention was successful. Pre-interventional TIMI flow is 3. Post-intervention TIMI flow is 3. No complications occurred at this lesion. There is a 0% residual stenosis post intervention.   STRESS TESTS  MYOCARDIAL PERFUSION IMAGING 04/27/2024  Interpretation Summary   The study is normal. The study is low risk.   Study was not gated due to frequent PVCs   No ST deviation was noted.   LV perfusion is normal. There is no evidence of ischemia. There is no evidence of infarction.   CT images were obtained for attenuation correction and were examined for the presence of coronary calcium  when appropriate.   Coronary calcium  assessment not performed due to prior revascularization.   Prior study available for comparison from 01/06/2021.   ECHOCARDIOGRAM  ECHOCARDIOGRAM COMPLETE 04/27/2024  Narrative ECHOCARDIOGRAM REPORT    Patient Name:   Nicole Barry     Date of Exam: 04/27/2024 Medical Rec #:  [id number]     Height:       58.0 in Accession #:    [id number]    Weight:       161.6 lb Date of Birth:  [birth year]     BSA:          1.663 m Patient Age:    48 years      BP:           128/74 mmHg Patient Gender: F             HR:           61 bpm. Exam Location:  [address]  Procedure: 2D Echo, 3D Echo, Color Doppler, Cardiac Doppler and Strain Analysis (Both Spectral and Color Flow Doppler were utilized during procedure).  Indications:    R06.00 Dyspnea Jaw  Pain  History:        Patient has prior history of Echocardiogram examinations, most recent 03/18/2020. Previous Myocardial Infarction and CAD; Risk Factors:Hypertension, Dyslipidemia and Diabetes.  Sonographer:    Powell Saras Referring Phys: 30 TESSA N CONTE  IMPRESSIONS   1. Left ventricular ejection fraction, by estimation, is 55 to 60%. Left ventricular ejection fraction by 3D volume is 57 %. The left ventricle has normal function. The left ventricle has no regional wall motion abnormalities. Left ventricular diastolic parameters are consistent with Grade I diastolic dysfunction (impaired relaxation). The average left ventricular global longitudinal strain is -16.8 %. The global longitudinal strain is normal. 2. Right ventricular systolic function is normal. The right ventricular size is normal. There is normal pulmonary artery systolic pressure. 3. The mitral valve is normal in structure. Mild mitral valve regurgitation. No evidence of mitral stenosis. 4. The aortic valve is tricuspid. Aortic valve regurgitation is not visualized. No aortic stenosis is present. 5. Aortic dilatation noted. There is mild dilatation of the ascending  aorta, measuring 41 mm. 6. The inferior vena cava is normal in size with greater than 50% respiratory variability, suggesting right atrial pressure of 3 mmHg.  Comparison(s): No significant change from prior study.  Conclusion(s)/Recommendation(s): No major changes from prior study. Mild dilation of the ascending aorta measuring 4.1 cm. Recommend annual surveillance with echo vs CT vs MRA.  FINDINGS Left Ventricle: Left ventricular ejection fraction, by estimation, is 55 to 60%. Left ventricular ejection fraction by 3D volume is 57 %. The left ventricle has normal function. The left ventricle has no regional wall motion abnormalities. The average left ventricular global longitudinal strain is -16.8 %. Strain was performed and the global longitudinal  strain is normal. The left ventricular internal cavity size was normal in size. There is no left ventricular hypertrophy. Left ventricular diastolic parameters are consistent with Grade I diastolic dysfunction (impaired relaxation).  Right Ventricle: The right ventricular size is normal. No increase in right ventricular wall thickness. Right ventricular systolic function is normal. There is normal pulmonary artery systolic pressure. The tricuspid regurgitant velocity is 2.40 m/s, and with an assumed right atrial pressure of 3 mmHg, the estimated right ventricular systolic pressure is 26.0 mmHg.  Left Atrium: Left atrial size was normal in size.  Right Atrium: Right atrial size was normal in size.  Pericardium: There is no evidence of pericardial effusion.  Mitral Valve: The mitral valve is normal in structure. Mild mitral valve regurgitation. No evidence of mitral valve stenosis.  Tricuspid Valve: The tricuspid valve is normal in structure. Tricuspid valve regurgitation is mild . No evidence of tricuspid stenosis.  Aortic Valve: The aortic valve is tricuspid. Aortic valve regurgitation is not visualized. No aortic stenosis is present.  Pulmonic Valve: The pulmonic valve was normal in structure. Pulmonic valve regurgitation is not visualized. No evidence of pulmonic stenosis.  Aorta: Aortic dilatation noted. There is mild dilatation of the ascending aorta, measuring 41 mm.  Venous: The inferior vena cava is normal in size with greater than 50% respiratory variability, suggesting right atrial pressure of 3 mmHg.  IAS/Shunts: No atrial level shunt detected by color flow Doppler.  Additional Comments: 3D was performed not requiring image post processing on an independent workstation and was normal.   LEFT VENTRICLE PLAX 2D LVIDd:         4.92 cm         Diastology LVIDs:         3.41 cm         LV e' medial:    7.25 cm/s LV PW:         0.83 cm         LV E/e' medial:  8.0 LV IVS:         0.92 cm         LV e' lateral:   8.12 cm/s LVOT diam:     1.90 cm         LV E/e' lateral: 7.1 LV SV:         60 LV SV Index:   36              2D Longitudinal LVOT Area:     2.84 cm        Strain 2D Strain GLS   -17.1 % (A4C): 2D Strain GLS   -17.9 % (A3C): 2D Strain GLS   -15.4 % (A2C): 2D Strain GLS   -16.8 % Avg:  3D Volume EF LV 3D EF:    Left ventricul ar  ejection fraction by 3D volume is 57 %. LV 3D EDV:   65.80 ml LV 3D ESV:   28.31 ml  3D Volume EF: 3D EF:        57 %  RIGHT VENTRICLE             IVC RV Basal diam:  3.55 cm     IVC diam: 1.31 cm RV S prime:     12.10 cm/s TAPSE (M-mode): 2.4 cm  LEFT ATRIUM             Index        RIGHT ATRIUM           Index LA diam:        3.60 cm 2.16 cm/m   RA Area:     14.90 cm LA Vol (A2C):   40.0 ml 24.05 ml/m  RA Volume:   37.00 ml  22.24 ml/m LA Vol (A4C):   41.0 ml 24.65 ml/m LA Biplane Vol: 41.2 ml 24.77 ml/m AORTIC VALVE LVOT Vmax:   106.00 cm/s LVOT Vmean:  70.000 cm/s LVOT VTI:    0.211 m  AORTA Ao Root diam: 2.90 cm Ao Asc diam:  4.10 cm  MITRAL VALVE               TRICUSPID VALVE MV Area (PHT): 4.11 cm    TR Peak grad:   23.0 mmHg MV Decel Time: 185 msec    TR Vmax:        240.00 cm/s MV E velocity: 57.80 cm/s MV A velocity: 80.30 cm/s  SHUNTS MV E/A ratio:  0.72        Systemic VTI:  0.21 m Systemic Diam: 1.90 cm  Georganna Archer Electronically signed by Georganna Archer Signature Date/Time: 04/27/2024/2:00:32 PM    Final          ______________________________________________________________________________________________       Current Reported Medications:.    Current Meds  Medication Sig   albuterol  (VENTOLIN  HFA) 108 (90 Base) MCG/ACT inhaler INHALE 2 PUFFS INTO THE LUNGS EVERY 6 (SIX) HOURS AS NEEDED FOR WHEEZING OR SHORTNESS OF BREATH.   aspirin  EC 81 MG EC tablet Take 1 tablet (81 mg total) by mouth daily. Swallow whole.   escitalopram  (LEXAPRO ) 20 MG tablet Take 1  tablet (20 mg total) by mouth daily.   ezetimibe  (ZETIA ) 10 MG tablet Take 1 tablet (10 mg total) by mouth daily.   loratadine  (CLARITIN ) 10 MG tablet Take 10 mg by mouth daily.   losartan  (COZAAR ) 50 MG tablet Take 1 tablet (50 mg total) by mouth daily.   metoprolol  succinate (TOPROL -XL) 25 MG 24 hr tablet Take 1 tablet (25 mg total) by mouth daily.   nitroGLYCERIN  (NITROSTAT ) 0.4 MG SL tablet Place 1 tablet (0.4 mg total) under the tongue every 5 (five) minutes as needed for chest pain.   pantoprazole  (PROTONIX ) 40 MG tablet Take 1 tablet (40 mg total) by mouth daily.   rosuvastatin  (CRESTOR ) 40 MG tablet Take 1 tablet (40 mg total) by mouth daily.   Semaglutide , 2 MG/DOSE, (OZEMPIC , 2 MG/DOSE,) 8 MG/3ML SOPN Inject 2 mg into the skin once a week.   Physical Exam:    VS:  BP 112/68   Pulse 67   Ht 4' 9 (1.448 m)   Wt 160 lb (72.6 kg)   SpO2 99%   BMI 34.62 kg/m    Wt Readings from Last 3 Encounters:  05/16/24 160 lb (72.6 kg)  04/27/24 161 lb (73  kg)  04/13/24 161 lb 9.6 oz (73.3 kg)    GEN: Well nourished, well developed in no acute distress NECK: No JVD; No carotid bruits CARDIAC: RRR, no murmurs, rubs, gallops RESPIRATORY:  Clear to auscultation without rales, wheezing or rhonchi  ABDOMEN: Soft, non-tender, non-distended EXTREMITIES:  No edema; No acute deformity     Asessement and Plan:.    CAD: Patient with prior stenting of the LAD in June 2021 in setting of NSTEMI.  Nuclear stress test on 04/27/2024 was normal and low risk. Today she reports that she has had no further jaw pain, denies any chest pain or increased shortness of breath.  She has increased her activity and is tolerating well.  She is reassured by her testing results.  She denies any further cardiac concerns or complaints. Heart healthy diet and regular cardiovascular exercise encouraged.  Reviewed ED precautions. Continue aspirin  81 mg daily, Zetia  10 mg daily, losartan  50 mg daily, metoprolol  succinate 25 mg  daily, Crestor  40 mg daily.  Ischemic cardiomyopathy: Echo at time of NSTEMI in 2021 indicated LVEF 30 to 35%, follow-up echo in October 2021 indicated LVEF 55 to 60%. Today she denies shortness of breath, lower extremity edema, orthopnea or PND. Continue losartan  50 mg daily, metoprolol  succinate 25 mg daily.  HTN: Blood pressure today 112/68.  Continue current antihypertensive regimen.  Hyperlipidemia: Last lipid profile indicated total cholesterol 124, HDL 44, triglycerides 131 and LDL 57.  Continue Crestor  40 mg daily and Zetia  10 mg daily.  Patient to have her annual physical beginning of next year with her PCP, fasting labs to be rechecked at that time.  Aortic dilation: Echo on 04/27/2024 indicated mild dilation in the ascending aorta measuring 41 mm.  Stable compared to prior echocardiogram.   Disposition: F/u with Dr. Verlin in one year or sooner if needed.   Signed, Shannon Kirkendall D Latha Staunton, NP

## 2024-05-13 ENCOUNTER — Other Ambulatory Visit (HOSPITAL_COMMUNITY): Payer: Self-pay

## 2024-05-16 ENCOUNTER — Encounter: Payer: Self-pay | Admitting: Cardiology

## 2024-05-16 ENCOUNTER — Other Ambulatory Visit: Payer: Self-pay

## 2024-05-16 ENCOUNTER — Ambulatory Visit: Attending: Cardiology | Admitting: Cardiology

## 2024-05-16 VITALS — BP 112/68 | HR 67 | Ht <= 58 in | Wt 160.0 lb

## 2024-05-16 DIAGNOSIS — E785 Hyperlipidemia, unspecified: Secondary | ICD-10-CM

## 2024-05-16 DIAGNOSIS — I255 Ischemic cardiomyopathy: Secondary | ICD-10-CM | POA: Diagnosis not present

## 2024-05-16 DIAGNOSIS — I2510 Atherosclerotic heart disease of native coronary artery without angina pectoris: Secondary | ICD-10-CM | POA: Diagnosis not present

## 2024-05-16 DIAGNOSIS — I10 Essential (primary) hypertension: Secondary | ICD-10-CM | POA: Diagnosis not present

## 2024-05-16 DIAGNOSIS — Z9861 Coronary angioplasty status: Secondary | ICD-10-CM

## 2024-05-16 NOTE — Patient Instructions (Signed)
 Medication Instructions:  Your physician recommends that you continue on your current medications as directed. Please refer to the Current Medication list given to you today.  *If you need a refill on your cardiac medications before your next appointment, please call your pharmacy*  Lab Work: NONE If you have labs (blood work) drawn today and your tests are completely normal, you will receive your results only by: MyChart Message (if you have MyChart) OR A paper copy in the mail If you have any lab test that is abnormal or we need to change your treatment, we will call you to review the results.  Testing/Procedures: NONE  Follow-Up: At [facility], you and your health needs are our priority.  As part of our continuing mission to provide you with exceptional heart care, our providers are all part of one team.  This team includes your primary Cardiologist (physician) and Advanced Practice Providers or APPs (Physician Assistants and Nurse Practitioners) who all work together to provide you with the care you need, when you need it.  Your next appointment:   1 year(s)  Provider:   Lonni Cash, MD

## 2024-05-24 DIAGNOSIS — Z6833 Body mass index (BMI) 33.0-33.9, adult: Secondary | ICD-10-CM | POA: Diagnosis not present

## 2024-05-24 DIAGNOSIS — I10 Essential (primary) hypertension: Secondary | ICD-10-CM | POA: Diagnosis not present

## 2024-05-24 DIAGNOSIS — R509 Fever, unspecified: Secondary | ICD-10-CM | POA: Diagnosis not present

## 2024-05-24 DIAGNOSIS — R051 Acute cough: Secondary | ICD-10-CM | POA: Diagnosis not present

## 2024-07-03 ENCOUNTER — Other Ambulatory Visit: Payer: Self-pay

## 2024-07-03 ENCOUNTER — Other Ambulatory Visit (HOSPITAL_COMMUNITY): Payer: Self-pay

## 2024-07-21 ENCOUNTER — Other Ambulatory Visit (HOSPITAL_COMMUNITY): Payer: Self-pay

## 2024-07-21 MED ORDER — ROSUVASTATIN CALCIUM 40 MG PO TABS: 40.0000 mg | ORAL_TABLET | Freq: Every day | ORAL | 3 refills | Status: AC

## 2024-07-21 MED ORDER — ESCITALOPRAM OXALATE 20 MG PO TABS: 20.0000 mg | ORAL_TABLET | Freq: Every day | ORAL | 3 refills | Status: AC

## 2024-07-21 MED ORDER — EZETIMIBE 10 MG PO TABS: 10.0000 mg | ORAL_TABLET | Freq: Every day | ORAL | 3 refills | Status: AC
